# Patient Record
Sex: Male | Born: 1935 | Race: White | Hispanic: No | Marital: Married | Smoking: Smoker, current status unknown
Health system: Southern US, Community
[De-identification: ages and names within clinical notes are randomized; demographics above are authoritative.]

## PROBLEM LIST (undated history)

## (undated) DIAGNOSIS — I10 Essential (primary) hypertension: Secondary | ICD-10-CM

## (undated) DIAGNOSIS — Z950 Presence of cardiac pacemaker: Secondary | ICD-10-CM

## (undated) DIAGNOSIS — H3530 Unspecified macular degeneration: Secondary | ICD-10-CM

## (undated) DIAGNOSIS — N182 Chronic kidney disease, stage 2 (mild): Secondary | ICD-10-CM

## (undated) DIAGNOSIS — H269 Unspecified cataract: Secondary | ICD-10-CM

## (undated) DIAGNOSIS — I82629 Acute embolism and thrombosis of deep veins of unspecified upper extremity: Secondary | ICD-10-CM

## (undated) DIAGNOSIS — J449 Chronic obstructive pulmonary disease, unspecified: Secondary | ICD-10-CM

## (undated) DIAGNOSIS — E785 Hyperlipidemia, unspecified: Secondary | ICD-10-CM

## (undated) DIAGNOSIS — C801 Malignant (primary) neoplasm, unspecified: Secondary | ICD-10-CM

## (undated) DIAGNOSIS — I442 Atrioventricular block, complete: Secondary | ICD-10-CM

## (undated) HISTORY — DX: Chronic kidney disease, stage 2 (mild): N18.2

## (undated) HISTORY — DX: Acute embolism and thrombosis of deep veins of unspecified upper extremity: I82.629

## (undated) HISTORY — DX: Presence of cardiac pacemaker: Z95.0

## (undated) HISTORY — DX: Chronic obstructive pulmonary disease, unspecified: J44.9

## (undated) HISTORY — DX: Hyperlipidemia, unspecified: E78.5

## (undated) HISTORY — DX: Malignant (primary) neoplasm, unspecified: C80.1

## (undated) HISTORY — PX: SQUAMOUS CELL CARCINOMA EXCISION: SHX2433

## (undated) HISTORY — DX: Atrioventricular block, complete: I44.2

## (undated) HISTORY — DX: Essential (primary) hypertension: I10

## (undated) HISTORY — PX: OTHER SURGICAL HISTORY: SHX169

---

## 2008-10-26 ENCOUNTER — Encounter: Admission: RE | Admit: 2008-10-26 | Discharge: 2009-01-24 | Payer: Self-pay | Admitting: *Deleted

## 2010-06-05 DIAGNOSIS — I442 Atrioventricular block, complete: Secondary | ICD-10-CM

## 2010-06-05 HISTORY — DX: Atrioventricular block, complete: I44.2

## 2010-06-30 ENCOUNTER — Inpatient Hospital Stay (HOSPITAL_COMMUNITY): Admission: EM | Admit: 2010-06-30 | Discharge: 2010-07-04 | Payer: Self-pay | Admitting: Emergency Medicine

## 2010-06-30 ENCOUNTER — Ambulatory Visit: Payer: Self-pay | Admitting: Cardiology

## 2010-07-01 ENCOUNTER — Encounter: Payer: Self-pay | Admitting: Cardiology

## 2010-07-02 HISTORY — PX: PACEMAKER PLACEMENT: SHX43

## 2010-07-05 ENCOUNTER — Telehealth: Payer: Self-pay | Admitting: Cardiology

## 2010-07-06 ENCOUNTER — Ambulatory Visit: Payer: Self-pay | Admitting: Cardiovascular Disease

## 2010-07-06 ENCOUNTER — Ambulatory Visit: Payer: Self-pay

## 2010-07-06 DIAGNOSIS — R609 Edema, unspecified: Secondary | ICD-10-CM | POA: Insufficient documentation

## 2010-07-09 ENCOUNTER — Telehealth (INDEPENDENT_AMBULATORY_CARE_PROVIDER_SITE_OTHER): Payer: Self-pay | Admitting: *Deleted

## 2010-07-09 ENCOUNTER — Ambulatory Visit (HOSPITAL_COMMUNITY)
Admission: RE | Admit: 2010-07-09 | Discharge: 2010-07-09 | Payer: Self-pay | Source: Home / Self Care | Admitting: Cardiovascular Disease

## 2010-07-11 ENCOUNTER — Ambulatory Visit: Payer: Self-pay

## 2010-07-11 ENCOUNTER — Ambulatory Visit: Payer: Self-pay | Admitting: Internal Medicine

## 2010-07-11 LAB — CONVERTED CEMR LAB: POC INR: 2.2

## 2010-07-18 ENCOUNTER — Ambulatory Visit: Payer: Self-pay | Admitting: Cardiology

## 2010-07-18 LAB — CONVERTED CEMR LAB: POC INR: 4.1

## 2010-07-25 ENCOUNTER — Ambulatory Visit: Payer: Self-pay | Admitting: Cardiology

## 2010-07-25 LAB — CONVERTED CEMR LAB
BUN: 23 mg/dL (ref 6–23)
CO2: 27 meq/L (ref 19–32)
Calcium: 9.2 mg/dL (ref 8.4–10.5)
Chloride: 95 meq/L — ABNORMAL LOW (ref 96–112)
Creatinine, Ser: 1.4 mg/dL (ref 0.4–1.5)
GFR calc non Af Amer: 53.45 mL/min — ABNORMAL LOW (ref >60.00)
Glucose, Bld: 48 mg/dL — CL (ref 70–99)
POC INR: 3.4
Potassium: 4.6 meq/L (ref 3.5–5.1)
Sodium: 132 meq/L — ABNORMAL LOW (ref 135–145)

## 2010-08-01 ENCOUNTER — Ambulatory Visit: Payer: Self-pay | Admitting: Cardiology

## 2010-08-01 LAB — CONVERTED CEMR LAB: POC INR: 2.1

## 2010-08-10 DIAGNOSIS — J449 Chronic obstructive pulmonary disease, unspecified: Secondary | ICD-10-CM | POA: Insufficient documentation

## 2010-08-10 DIAGNOSIS — E785 Hyperlipidemia, unspecified: Secondary | ICD-10-CM | POA: Insufficient documentation

## 2010-08-10 DIAGNOSIS — E871 Hypo-osmolality and hyponatremia: Secondary | ICD-10-CM | POA: Insufficient documentation

## 2010-08-10 DIAGNOSIS — I10 Essential (primary) hypertension: Secondary | ICD-10-CM | POA: Insufficient documentation

## 2010-08-10 DIAGNOSIS — E119 Type 2 diabetes mellitus without complications: Secondary | ICD-10-CM | POA: Insufficient documentation

## 2010-08-10 DIAGNOSIS — N259 Disorder resulting from impaired renal tubular function, unspecified: Secondary | ICD-10-CM | POA: Insufficient documentation

## 2010-08-10 DIAGNOSIS — Z8669 Personal history of other diseases of the nervous system and sense organs: Secondary | ICD-10-CM | POA: Insufficient documentation

## 2010-08-10 DIAGNOSIS — J4489 Other specified chronic obstructive pulmonary disease: Secondary | ICD-10-CM | POA: Insufficient documentation

## 2010-08-13 ENCOUNTER — Ambulatory Visit
Admission: RE | Admit: 2010-08-13 | Discharge: 2010-08-13 | Payer: Self-pay | Source: Home / Self Care | Attending: Cardiology | Admitting: Cardiology

## 2010-08-13 ENCOUNTER — Encounter: Payer: Self-pay | Admitting: Cardiology

## 2010-08-13 ENCOUNTER — Ambulatory Visit: Admission: RE | Admit: 2010-08-13 | Discharge: 2010-08-13 | Payer: Self-pay | Source: Home / Self Care

## 2010-08-13 DIAGNOSIS — Z950 Presence of cardiac pacemaker: Secondary | ICD-10-CM | POA: Insufficient documentation

## 2010-08-13 DIAGNOSIS — F17200 Nicotine dependence, unspecified, uncomplicated: Secondary | ICD-10-CM | POA: Insufficient documentation

## 2010-08-13 LAB — CONVERTED CEMR LAB: POC INR: 1.9

## 2010-09-03 ENCOUNTER — Ambulatory Visit: Admission: RE | Admit: 2010-09-03 | Discharge: 2010-09-03 | Payer: Self-pay | Source: Home / Self Care

## 2010-09-03 ENCOUNTER — Telehealth (INDEPENDENT_AMBULATORY_CARE_PROVIDER_SITE_OTHER): Payer: Self-pay | Admitting: *Deleted

## 2010-09-03 LAB — CONVERTED CEMR LAB: POC INR: 1.8

## 2010-09-04 NOTE — Progress Notes (Signed)
Summary: pt having ankle swelling, some wrist since surgery  Phone Note Call from Patient   Caller: Patient  [phone] Summary of Call: pt having swelling in ankles and some in r wrist since surgery Initial call taken by: Glynda Jaeger,  July 05, 2010 1:33 PM  Follow-up for Phone Call        Pt. Called to report that he has non pitting edema in LE . He is not able to see his  ankles. Pt. said he had some edema while he was in the hospital for pacer implant. He did not have it when he was d/c yesterday. He notice the edema about two hrs. ago. Pt. states he put salt on his eggs this morning at breakfast time. According to pt. he is  taken the same medications for years. Follow-up by: Ollen Gross, RN, BSN,  July 05, 2010 2:08 PM  Additional Follow-up for Phone Call Additional follow up Details #1::        spoke with pt, he denies SOB. his right hand is swollen and both legs and feet.  he states he has had swelling in the right hand before when he had a blood clot but this is not the same. he was instructed to elevate his legs and to avoid salt. will foward for dr Jens Som review Deliah Goody, RN  July 05, 2010 3:18 PM\par   New Problems: SWELLING OF LIMB [phone])   Additional Follow-up for Phone Call Additional follow up Details #2::    f/u as scheduled; call if worsens Ferman Hamming, MD, [facility]  July 05, 2010 7:01 PM  pt says now his right arm is swollen from shoulder to hand, wants to "drop by" after lunch today and have a dr look at it between pt's -pls call to advise [phone] Glynda Jaeger  July 06, 2010 8:13 AM   Additional Follow-up for Phone Call Additional follow up Details #3:: Details for Additional Follow-up Action Taken: pt states last night his right arm began swelling from shoulder to his right hand--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

## 2010-09-04 NOTE — Medication Information (Signed)
Summary: new coumadin started 4 mg 12*-2-11/sl  Anticoagulant Therapy  Managed by: Leota Sauers, PharmD, BCPS, CPP Referring MD: Tonny Bollman MD. Supervising MD: Ladona Ridgel MD, Sharlot Gowda Indication 1: DVT INR POC 2.2 INR RANGE 2-3  Dietary changes: no    Health status changes: no    Bleeding/hemorrhagic complications: no    Recent/future hospitalizations: no    Any changes in medication regimen? no    Recent/future dental: no  Any missed doses?: no       Is patient compliant with meds? yes      Comments: Possible RUE clot after placement of pacer  - too much upper extremity edema to visualize clot on [place] - per pt - started on Coumadin 4mg  1 tab once daily since 12/2  Allergies (verified): No Known Drug Allergies  Anticoagulation Management History:      The patient comes in today for his initial visit for anticoagulation therapy.  Positive risk factors for bleeding include an age of 75 years or older.  The bleeding index is 'intermediate risk'.  Negative CHADS2 values include Age > 17 years old.  Anticoagulation responsible provider: Ladona Ridgel MD, Sharlot Gowda.  INR POC: 2.2.  Cuvette Lot#: [id number].    Anticoagulation Management Assessment/Plan:      The patient's current anticoagulation dose is Warfarin sodium 4 mg tabs: take one tablet by mouth every evening.  The next INR is due 07/18/2010.  Results were reviewed/authorized by Leota Sauers, PharmD, BCPS, CPP.         Current Anticoagulation Instructions: INR 2.2  Coumadin 4mg  tabs - 1 tab each day

## 2010-09-04 NOTE — Progress Notes (Signed)
  Phone Note Call from Patient   Caller: Patient Summary of Call: allergy   Follow-up for Phone Call        Pt has called to confirm that he does not have any dye allergies per his medical records from [place].  We discussed the venogram procedure as well as his previous hospital admission upon which he received a pacemaker.  He reports a "40% decrease in swelling since last Friday 07/06/10" Especially in his right hand.  He is not having any discomfort at this time and is aware of arrival time to Short Stay this am. Mylo Red RN

## 2010-09-04 NOTE — Assessment & Plan Note (Signed)
Summary: rov   Visit Type:  Follow-up  CC:  swelling in right arm and both feet.  History of Present Illness: 77 year-old male presents for hospital followup after recent hospitalization for complete heart block. The patient required emergency transvenous pacing followed by a permanent pacemaker placed on the right side 07/02/2010. The right side was chosen because he has had squamous cell CA and prior XRT/surgery with limited use of his right arm and hand. He called into the office this am with right arm swelling, new since hospital discharge. A venous duplex was done and did not show DVT. He also complains of bilateral leg swelling. He has stable dyspnea, no chest pain, and no orthopnea or PND.   Current Medications (verified): 1)  Tylenol Extra Strength 500 Mg Tabs (Acetaminophen) .... Take One By Mouth Once Daily 2)  Acidophilus  Caps (Lactobacillus) .... Take 1-3 Capsules Three Times A Day With Meals 3)  Actos 15 Mg Tabs (Pioglitazone Hcl) .... Take One Daily 4)  Aspirin Ec 325 Mg Tbec (Aspirin) .... Take Two Tablets By Mouth Daily 5)  Coq10 .... One Every Morning 6)  Glucosamine-Chondroitin 250-200 Mg Caps (Glucosamine-Chondroitin) .... Take Two Every Morning 7)  Mucinex Dm .... One By Mouth Two Times A Day 8)  Niacin 100 Mg Tabs (Niacin) .... Take Two Times A Day 9)  Prandin 2 Mg Tabs (Repaglinide) .... Take Three Times A Day Before Meals 10)  Simvastatin 40 Mg Tabs (Simvastatin) .... Take One Tablet By Mouth Daily At Bedtime 11)  Tarka 4-240 Mg Cr-Tabs (Trandolapril-Verapamil Hcl) .... Once Daily At Bedtime 12)  Tricor 145 Mg Tabs (Fenofibrate) .... Take One Daily At Bedtime 13)  Vitamin B Complex .... Take One Tablet By Mouth Daily  Allergies (verified): No Known Drug Allergies  Past History:  Past medical history reviewed for relevance to current acute and chronic problems.  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 HEENT: normal Neck: normal carotid upstrokes without bruits, JVP normal Lungs: CTA CV: RRR without murmur or gallop Abd: soft, NT, positive BS, no bruit, no organomegaly Ext: 2+ bilateral pedal edema. peripheral pulses 2+, left arm normal, right arm 2-3+ edema to the hand Skin: warm and dry without rash    Venous Doppler  Procedure date:  07/06/2010  Findings:      Right arm venous duplex: negative for DVT  Impression & Recommendations:  Problem # 1:  SWELLING OF LIMB (ICD-729.81)  Pt with asymmetric edema of the ipsilateral arm following permanent pacemaker insertion via a right subclavian approach. There is a high clinical probability of arm DVT (device related), despite the negative duplex. Reviewed the patient's case with Dr Graciela Husbands, and recommend start warfarin 4 mg daily. He will come for a venogram Monday, Dec 5th to eval for R arm DVT. He was advised that if he can  no longer spin his wedding ring on his finger, or if he develps skin color change or pain in the 4th finger, he needs to immediately have his ring cut off. He had a similar problem at the time of radiation therapy and was able to keep the ring on at that time. Arm elevation was recommended as well.   Orders: Cardiac Catheterization (Cardiac Cath) [facility] Coumadin Clinic Referral (Coumadin clinic)  Problem # 2:  EDEMA (ICD-782.3)  The patient also has bilateral leg edema without  obvious signs of CHF. He had renal dysfunction on presentation but at discharge his creatinine was 1.2 mg/dL. Will start furosemide 40 mg daily and KCl 20 meq daily. Followup BMET and BNP Monday am.  Echo last week showed normal LV function.  Orders: Cardiac Catheterization (Cardiac Cath) [facility]. Coumadin Clinic Referral (Coumadin clinic)  Patient Instructions: 1)  You have been scheduled for a Venogram on Monday 07/09/10. 2)  Your physician has recommended you make the following change in your medication: START Warfarin 4mg  take one tablet by mouth every evening, START Furosemide 40mg  take one tablet by mouth daily, START Potassium Chloride take one tablet by mouth daily, DECREASE Aspirin 81mg  daily 3)  You have been referred to the COUMADIN CLINIC. Prescriptions: WARFARIN SODIUM 4 MG TABS (WARFARIN SODIUM) take one tablet by mouth every evening  #30 x 0   Entered by:   Julieta Gutting, RN, BSN   Authorized by:   Norva Karvonen, MD   Signed by:   Julieta Gutting, RN, BSN on 07/06/2010   Method used:   Print then Give to Patient   RxID:   [id number] FUROSEMIDE 40 MG TABS (FUROSEMIDE) Take one tablet by mouth daily.  #30 x 2   Entered by:   Julieta Gutting, RN, BSN   Authorized by:   Norva Karvonen, MD   Signed by:   Julieta Gutting, RN, BSN on 07/06/2010   Method used:   Print then Give to Patient   RxID:   [id number] POTASSIUM CHLORIDE CRYS CR 20 MEQ CR-TABS (POTASSIUM CHLORIDE CRYS CR) Take one tablet by mouth daily  #30 x 2   Entered by:   Julieta Gutting, RN, BSN   Authorized by:   Norva Karvonen, MD   Signed by:   Julieta Gutting, RN, BSN on 07/06/2010   Method used:   Print then Give to Patient   RxID:   [id number]

## 2010-09-04 NOTE — Procedures (Signed)
Summary: wound check.Gerald Hardin   Current Medications (verified): 1)  Tylenol Extra Strength 500 Mg Tabs (Acetaminophen) .... Take One By Mouth Once Daily 2)  Acidophilus  Caps (Lactobacillus) .... Take 1-3 Capsules Three Times A Day With Meals 3)  Actos 15 Mg Tabs (Pioglitazone Hcl) .... Take One Daily 4)  Aspirin 81 Mg Tbec (Aspirin) .... Take One Tablet By Mouth Daily 5)  Coq10 .... One Every Morning 6)  Glucosamine-Chondroitin 250-200 Mg Caps (Glucosamine-Chondroitin) .... Take Two Every Morning 7)  Mucinex Dm .... One By Mouth Two Times A Day 8)  Niacin 100 Mg Tabs (Niacin) .... Take Two Times A Day 9)  Prandin 2 Mg Tabs (Repaglinide) .... Take Three Times A Day Before Meals 10)  Simvastatin 40 Mg Tabs (Simvastatin) .... Take One Tablet By Mouth Daily At Bedtime 11)  Tarka 4-240 Mg Cr-Tabs (Trandolapril-Verapamil Hcl) .... Once Daily At Bedtime 12)  Tricor 145 Mg Tabs (Fenofibrate) .... Take One Daily At Bedtime 13)  Vitamin B Complex .... Take One Tablet By Mouth Daily 14)  Warfarin Sodium 4 Mg Tabs (Warfarin Sodium) .... Take One Tablet By Mouth Every Evening 15)  Furosemide 40 Mg Tabs (Furosemide) .... Take One Tablet By Mouth Daily. 16)  Potassium Chloride Crys Cr 20 Meq Cr-Tabs (Potassium Chloride Crys Cr) .... Take One Tablet By Mouth Daily  Allergies (verified): No Known Drug Allergies  PPM Specifications Following MD:  Sherryl Manges, MD     PPM Vendor:  [employer]     PPM Model Number:  2110     PPM Serial Number:  [id number] PPM DOI:  07/02/2010     PPM Implanting MD:  Sherryl Manges, MD  Lead 1    Location: RA     DOI: 07/02/2010     Model #: 2088     Serial #: [id number]     Status: active Lead 2    Location: RV     DOI: 07/02/2010     Model #: 2088     Serial #: [id number]     Status: active  PPM Follow Up Battery Voltage:  3.10 V     Battery Est. Longevity:  5.9-7.1 YRS       PPM Device Measurements [employer]  Amplitude: 3.7 mV, Impedance: 400 ohms, Threshold: 0.75 V at 0.4  msec Right Ventricle  Amplitude: 12.0 mV, Impedance: 550 ohms, Threshold: 0.75 V at 0.4 msec  Episodes MS Episodes:  244     Percent Mode Switch:  6.2%     Coumadin:  Yes Ventricular High Rate:  0     Atrial Pacing:  2.3%     Ventricular Pacing:  >99%  Parameters Mode:  DDD     Lower Rate Limit:  60     Upper Rate Limit:  130 Paced AV Delay:  180     Sensed AV Delay:  160 Next Cardiology Appt Due:  10/30/2010 Tech Comments:  WOUND CHECK--STERI STRIPS REMOVED.  NO REDNESS OR SWELLING AT SITE.  PT HAS RIGHT ARM SWELLING THAT IS BEING TAKING CARE OF.  PT SCHEDULED FOR VENOGRAM AT SOME POINT.  244 AMS EPISODES--LONGEST WAS 20 MINUTES.  NORMAL DEVICE FUNCTION.  NO CHANGES MADE. ROV 10-30-10 @ 920  W/SK. Gerald Hardin  July 12, 2010 8:24 AM

## 2010-09-04 NOTE — Letter (Signed)
Summary: Cardiac Catheterization Instructions- Main Lab  [facility], Main Office  [address]   [place], [place] [address]   Phone: [phone]  Fax: [phone]     07/06/2010 MRN: [id number]  Emmanuelle Caratachea [address] [place], [place]  [address]  Dear Mr. Bushnell,   You are scheduled for Venogram on Monday July 09, 2010 with Dr. Excell Seltzer.  Please arrive at the [facility] at 11:45       a.m. on the day of your procedure.  1. DIET     _X___ Nothing to eat after midnight. You may have clear liquids until 8:00 AM then nothingto eat or drink after midnight. You can take your medications with a sip of water.  2. MAKE SURE YOU TAKE YOUR ASPIRIN.  3. _X____ DO NOT TAKE these medications before your procedure:      Do not take Actos the morning of procedure.          __X__ YOU MAY TAKE ALL of your remaining medications with a small amount of water.      4. Plan for one night stay - bring personal belongings (i.e. toothpaste, toothbrush, etc.)  5. Bring a current list of your medications and current insurance cards.  6. Must have a responsible person to drive you home.   7. Someone must be with you for the first 24 hours after you arrive home.  8. Please wear clothes that are easy to get on and off and wear slip-on shoes.  *Special note: Every effort is made to have your procedure done on time.  Occasionally there are emergencies that present themselves at the hospital that may cause delays.  Please be patient if a delay does occur.  If you have any questions after you get home, please call the office at the number listed above.  Julieta Gutting, RN, BSN

## 2010-09-06 NOTE — Medication Information (Signed)
Summary: rov/sp  Anticoagulant Therapy  Managed by: Weston Brass, PharmD Referring MD: Tonny Bollman MD. Supervising MD: Shirlee Latch MD, Freida Busman Indication 1: DVT INR POC 3.4 INR RANGE 2-3  Dietary changes: no    Health status changes: yes       Details: Pt complains of losing sense of taste since starting K.  He had a BMET 2 days after starting supplement that was 4.5.  Will recheck BMET today to make sure K is okay.  Pt's swelling has improved since starting Lasix.    Bleeding/hemorrhagic complications: no    Recent/future hospitalizations: no    Any changes in medication regimen? no    Recent/future dental: no  Any missed doses?: no       Is patient compliant with meds? yes      Comments: Pt will be traveling to [facility] in the next few weeks.  Has Rx to get INR checked in [place] next week.   Current Medications (verified): 1)  Tylenol Extra Strength 500 Mg Tabs (Acetaminophen) .... Take One By Mouth Once Daily 2)  Acidophilus  Caps (Lactobacillus) .... Take 1-3 Capsules Three Times A Day With Meals 3)  Actos 15 Mg Tabs (Pioglitazone Hcl) .... Take One Daily 4)  Aspirin 81 Mg Tbec (Aspirin) .... Take One Tablet By Mouth Daily 5)  Coq10 .... One Every Morning 6)  Glucosamine-Chondroitin 250-200 Mg Caps (Glucosamine-Chondroitin) .... Take Two Every Morning 7)  Mucinex Dm .... One By Mouth Two Times A Day 8)  Prandin 2 Mg Tabs (Repaglinide) .... Take Three Times A Day Before Meals 9)  Simvastatin 40 Mg Tabs (Simvastatin) .... Take One Tablet By Mouth Daily At Bedtime 10)  Tarka 4-240 Mg Cr-Tabs (Trandolapril-Verapamil Hcl) .... Once Daily At Bedtime 11)  Tricor 145 Mg Tabs (Fenofibrate) .... Take One Daily At Bedtime 12)  Vitamin B Complex .... Take One Tablet By Mouth Daily 13)  Warfarin Sodium 4 Mg Tabs (Warfarin Sodium) .... Take One Tablet By Mouth Every Evening 14)  Furosemide 40 Mg Tabs (Furosemide) .... Take One Tablet By Mouth Daily. 15)  Potassium Chloride Crys Cr 20 Meq Cr-Tabs  (Potassium Chloride Crys Cr) .... Take One Tablet By Mouth Daily 16)  Nicotinic Acid 100 Mg Cr-Caps (Niacin) .... Take 1 Tablet By Mouth Two Times A Day  Allergies: No Known Drug Allergies  Anticoagulation Management History:      The patient is taking warfarin and comes in today for a routine follow up visit.  Positive risk factors for bleeding include an age of 75 years or older.  The bleeding index is 'intermediate risk'.  Negative CHADS2 values include Age > 75 years old.  Anticoagulation responsible provider: Shirlee Latch MD, [place].  INR POC: 3.4.  Cuvette Lot#: [id number].  Exp: 09/2011.    Anticoagulation Management Assessment/Plan:      The patient's current anticoagulation dose is Warfarin sodium 4 mg tabs: take one tablet by mouth every evening.  The target INR is 2.0-3.0.  The next INR is due 08/01/2010.  Anticoagulation instructions were given to patient/wife.  Results were reviewed/authorized by Weston Brass, PharmD.  He was notified by Weston Brass PharmD.        Coagulation management information includes: [phone] (wife's cell phone)- most consistent number to call in [place] or [place].  [place] number- [phone] [place] residence- pt's mother in law).  Prior Anticoagulation Instructions: INR 4.1  Skip today's dose of Coumadin then decrease dose to 1 tablet every day except 1/2 tablet on Tuesday, Thursday and  Saturday.  Start taking Coumadin in the morning.  Recheck INR in 1 week.   Current Anticoagulation Instructions: INR 3.4  Skip tomorrow's dose of Coumadin then decrease dose to 1/2 tablet every day except 1 tablet on Monday, Wednesday and Friday.  Recheck INR in 1 week in [place].

## 2010-09-06 NOTE — Medication Information (Signed)
Summary: rov/sp  Anticoagulant Therapy  Managed by: Weston Brass, PharmD Referring MD: Tonny Bollman MD. Supervising MD: Riley Kill MD, Maisie Fus Indication 1: DVT Lab Used: [facility] Site: [address] INR POC 2.1 INR RANGE 2-3  Dietary changes: no    Health status changes: no    Bleeding/hemorrhagic complications: no    Recent/future hospitalizations: no    Any changes in medication regimen? no    Recent/future dental: no  Any missed doses?: no       Is patient compliant with meds? yes       Allergies: No Known Drug Allergies  Anticoagulation Management History:      The patient is taking warfarin and comes in today for a routine follow up visit.  Positive risk factors for bleeding include an age of 77 years or older.  The bleeding index is 'intermediate risk'.  Negative CHADS2 values include Age > 78 years old.  Anticoagulation responsible provider: Riley Kill MD, Maisie Fus.  INR POC: 2.1.  Cuvette Lot#: [id number].  Exp: 08/2011.    Anticoagulation Management Assessment/Plan:      The patient's current anticoagulation dose is Warfarin sodium 4 mg tabs: take one tablet by mouth every evening.  The target INR is 2.0-3.0.  The next INR is due 08/13/2010.  Anticoagulation instructions were given to patient/wife.  Results were reviewed/authorized by Weston Brass, PharmD.  He was notified by Weston Brass PharmD.         Prior Anticoagulation Instructions: INR 3.4  Skip tomorrow's dose of Coumadin then decrease dose to 1/2 tablet every day except 1 tablet on Monday, Wednesday and Friday.  Recheck INR in 1 week in [place].   Current Anticoagulation Instructions: INR 2.1  Continue same dose of 1/2 tablet every day except 1 tablet on Monday, Wednesday and Friday.  Recheck INR in 2 weeks.

## 2010-09-06 NOTE — Medication Information (Signed)
Summary: rov coumadin - [facility]  Anticoagulant Therapy  Managed by: Weston Brass, PharmD Referring MD: Tonny Bollman MD. Supervising MD: Shirlee Latch MD, Freida Busman Indication 1: DVT INR POC 4.1 INR RANGE 2-3  Dietary changes: yes       Details: decreased appetite  Health status changes: yes       Details: some swelling in feet (1+ pitting edema)  pt states has improved  Bleeding/hemorrhagic complications: no    Recent/future hospitalizations: no    Any changes in medication regimen? no    Recent/future dental: no  Any missed doses?: no       Is patient compliant with meds? yes       Allergies: No Known Drug Allergies  Anticoagulation Management History:      The patient is taking warfarin and comes in today for a routine follow up visit.  Positive risk factors for bleeding include an age of 32 years or older.  The bleeding index is 'intermediate risk'.  Negative CHADS2 values include Age > 17 years old.  Anticoagulation responsible provider: Shirlee Latch MD, [place].  INR POC: 4.1.    Anticoagulation Management Assessment/Plan:      The patient's current anticoagulation dose is Warfarin sodium 4 mg tabs: take one tablet by mouth every evening.  The target INR is 2.0-3.0.  The next INR is due 07/25/2010.  Results were reviewed/authorized by Weston Brass, PharmD.  He was notified by Weston Brass PharmD.         Prior Anticoagulation Instructions: INR 2.2  Coumadin 4mg  tabs - 1 tab each day  Current Anticoagulation Instructions: INR 4.1  Skip today's dose of Coumadin then decrease dose to 1 tablet every day except 1/2 tablet on Tuesday, Thursday and Saturday.  Start taking Coumadin in the morning.  Recheck INR in 1 week.

## 2010-09-06 NOTE — Assessment & Plan Note (Signed)
Summary: Gerald Hardin   History of Present Illness: 77 year old male admitted in November of 2011 with complete heart block. He required emergency temporary transvenous pacer. Echocardiogram showed an ejection fraction of 65-70% with a mildly elevated LVOT gradient. He ultimately had a permanent pacemaker placed. He was seen in followup by Dr. Excell Seltzer and noted to have swelling in his right upper extremity where the pacemaker was placed. A duplex apparently showed no DVT but there was high suspicion of a thrombus clinically and he was placed on Coumadin. He was scheduled for a venogram but due to the swelling they were unable to access his vein. Since then the patient has dyspnea with more extreme activities but not with routine activities. It is relieved with rest. It is not associated with chest pain. There is no orthopnea, PND. There is no syncope or palpitations. There is no exertional chest pain. He has had lower extremity edema and is taking Lasix now.   Current Medications (verified): 1)  Tylenol Extra Strength 500 Mg Tabs (Acetaminophen) .... Take One By Mouth Once Daily 2)  Acidophilus  Caps (Lactobacillus) .... Take 1-3 Capsules Three Times A Day With Meals 3)  Actos 15 Mg Tabs (Pioglitazone Hcl) .... Take One Daily 4)  Aspirin 81 Mg Tbec (Aspirin) .... Take One Tablet By Mouth Daily 5)  Coq10 .... One Every Morning 6)  Glucosamine-Chondroitin 250-200 Mg Caps (Glucosamine-Chondroitin) .... Take Two Every Morning 7)  Mucinex Dm .... One By Mouth Two Times A Day 8)  Prandin 2 Mg Tabs (Repaglinide) .... Take Three Times A Day Before Meals 9)  Simvastatin 40 Mg Tabs (Simvastatin) .... Take One Tablet By Mouth Daily At Bedtime 10)  Tarka 4-240 Mg Cr-Tabs (Trandolapril-Verapamil Hcl) .... Once Daily At Bedtime 11)  Tricor 145 Mg Tabs (Fenofibrate) .... Take One Daily At Bedtime 12)  Vitamin B Complex .... Take One Tablet By Mouth Daily 13)  Warfarin Sodium 4 Mg Tabs (Warfarin Sodium) ....  Take One Tablet By Mouth Every Evening 14)  Furosemide 20 Mg Tabs (Furosemide) .... Take One Tablet By Mouth Daily. 15)  Nicotinic Acid 100 Mg Cr-Caps (Niacin) .... 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 Smoker No ETOH Lives in [place] permanently but in [place] for an extended time  Review of Systems       no fevers or chills, productive cough, hemoptysis, dysphasia, odynophagia, melena, hematochezia, dysuria, hematuria, rash, seizure activity, orthopnea, PND, pedal edema, claudication. Remaining systems are negative.   Vital Signs:  Patient profile:   77 year old male Height:      71 inches Weight:      194 pounds BMI:     27.16 Pulse rate:   68 / minute Pulse rhythm:   regular Resp:     18 per minute BP sitting:   110 / 60  (left arm)  Vitals Entered By: Kem Parkinson (August 13, 2010 8:40 AM)  Physical Exam  General:  Well-developed well-nourished in no acute distress.  Skin is warm and dry.  HEENT is normal.  Neck is supple. No thyromegaly.  Chest is clear to auscultation with normal expansion.  Cardiovascular exam is regular rate and rhythm.  Abdominal  exam nontender or distended. No masses palpated. Extremities show 2+ ankle edema. Right upper extremity shows 1+ edema surrounding the elbow. It has improved per patient report. He has decreased muscular strength in his right upper extremity from previous radiation and tumor resection. neuro grossly intact    EKG  Procedure date:  08/13/2010  Findings:      Sinus with ventricular pacing.  PPM Specifications Following MD:  Sherryl Manges, MD     PPM Vendor:  [employer]     PPM Model Number:  2110      PPM Serial Number:  [id number] PPM DOI:  07/02/2010     PPM Implanting MD:  Sherryl Manges, MD  Lead 1    Location: [facility]     DOI: 07/02/2010     Model #: 2088     Serial #: [id number]     Status: active Lead 2    Location: [facility]     DOI: 07/02/2010     Model #: 2088     Serial #: [id number]     Status: active  Episodes Coumadin:  Yes  Parameters Mode:  DDD     Lower Rate Limit:  60     Upper Rate Limit:  130 Paced AV Delay:  180     Sensed AV Delay:  160  Impression & Recommendations:  Problem # 1:  HYPERTENSION (ICD-401.9) Blood pressure controlled on present medications. Will continue. His updated medication list for this problem includes:    Aspirin 81 Mg Tbec (Aspirin) .Marland Kitchen... Take one tablet by mouth daily    Tarka 4-240 Mg Cr-tabs (Trandolapril-verapamil hcl) ..... Once daily at bedtime    Furosemide 20 Mg Tabs (Furosemide) .Marland Kitchen... Take one tablet by mouth daily.  Problem # 2:  HYPERLIPIDEMIA (ICD-272.4) Continue present medications. His updated medication list for this problem includes:    Simvastatin 40 Mg Tabs (Simvastatin) .Marland Kitchen... Take one tablet by mouth daily at bedtime    Tricor 145 Mg Tabs (Fenofibrate) .Marland Kitchen... Take one daily at bedtime  Problem # 3:  COPD (ICD-496)  Problem # 4:  DM (ICD-250.00)  His updated medication list for this problem includes:    Actos 15 Mg Tabs (Pioglitazone hcl) .Marland Kitchen... Take one daily    Aspirin 81 Mg Tbec (Aspirin) .Marland Kitchen... Take one tablet by mouth daily    Prandin 2 Mg Tabs (Repaglinide) .Marland Kitchen... Take three times a day before meals    Tarka 4-240 Mg Cr-tabs (Trandolapril-verapamil hcl) ..... Once daily at bedtime  Problem # 5:  EDEMA (ICD-782.3) Patient with intermittent lower extremity edema. He will continue Lasix 20 mg p.o. daily with an additional 20 mg added p.r.n. daily increased edema. If he takes 40 mg he will take 20 mEq of potassium.  Problem # 6:  SWELLING OF LIMB (ICD-729.81) He had right upper extremity edema following his pacemaker and is being  treated empirically for DVT. We'll discuss timing of venogram with Dr. Excell Seltzer and Dr. Graciela Husbands.  Problem # 7:  PACEMAKER, PERMANENT (ICD-V45.01) Followup with EP.  Problem # 8:  TOBACCO ABUSE (ICD-305.1) Patient counseled on discontinuing.  Patient Instructions: 1)  Your physician recommends that you schedule a follow-up appointment ON MARCH 27 @ 9:20AM

## 2010-09-06 NOTE — Cardiovascular Report (Signed)
Summary: Office Visit   Office Visit   Imported By: Roderic Ovens 07/23/2010 16:19:40  _____________________________________________________________________  External Attachment:    Type:   Image     Comment:   External Document

## 2010-09-06 NOTE — Medication Information (Signed)
Summary: rov/sp  Anticoagulant Therapy  Managed by: Cloyde Reams, RN, BSN Referring MD: Tonny Bollman MD. Supervising MD: Jens Som MD, Arlys John Indication 1: DVT Lab Used: [facility] [place] Site: [address] INR POC 1.9 INR RANGE 2-3  Dietary changes: no    Health status changes: no    Bleeding/hemorrhagic complications: no    Recent/future hospitalizations: no    Any changes in medication regimen? no    Recent/future dental: no  Any missed doses?: no       Is patient compliant with meds? yes       Allergies: No Known Drug Allergies  Anticoagulation Management History:      The patient is taking warfarin and comes in today for a routine follow up visit.  Positive risk factors for bleeding include an age of 75 years or older and presence of serious comorbidities.  The bleeding index is 'intermediate risk'.  Positive CHADS2 values include History of HTN and History of Diabetes.  Negative CHADS2 values include Age > 60 years old.  Anticoagulation responsible provider: Jens Som MD, Arlys John.  INR POC: 1.9.  Cuvette Lot#: [id number].  Exp: 09/2011.    Anticoagulation Management Assessment/Plan:      The patient's current anticoagulation dose is Warfarin sodium 4 mg tabs: take one tablet by mouth every evening.  The target INR is 2.0-3.0.  The next INR is due 09/03/2010.  Anticoagulation instructions were given to patient/wife.  Results were reviewed/authorized by Cloyde Reams, RN, BSN.  He was notified by Cloyde Reams RN.         Prior Anticoagulation Instructions: INR 2.1  Continue same dose of 1/2 tablet every day except 1 tablet on Monday, Wednesday and Friday.  Recheck INR in 2 weeks.   Current Anticoagulation Instructions: INR 1.9  Take an extra 1/2 tablet today, then resume same dosage 1/2 tablet daily except 1 tablet on Mondays, Wednesdays, and Fridays.  Recheck in 3 weeks.

## 2010-09-12 NOTE — Medication Information (Signed)
Summary: rov/ewj  Anticoagulant Therapy  Managed by: Bethena Midget, RN, BSN Referring MD: Tonny Bollman MD. Supervising MD: Antoine Poche MD, Fayrene Fearing Indication 1: DVT Lab Used: [facility] [place] Site: [address] INR POC 1.8 INR RANGE 2-3  Dietary changes: no    Health status changes: no    Bleeding/hemorrhagic complications: no    Recent/future hospitalizations: no    Any changes in medication regimen? no    Recent/future dental: no  Any missed doses?: no       Is patient compliant with meds? yes       Allergies: No Known Drug Allergies  Anticoagulation Management History:      The patient is taking warfarin and comes in today for a routine follow up visit.  Positive risk factors for bleeding include an age of 75 years or older and presence of serious comorbidities.  The bleeding index is 'intermediate risk'.  Positive CHADS2 values include History of HTN and History of Diabetes.  Negative CHADS2 values include Age > 75 years old.  Anticoagulation responsible provider: Antoine Poche MD, Fayrene Fearing.  INR POC: 1.8.  Cuvette Lot#: [id number].  Exp: 08/2011.    Anticoagulation Management Assessment/Plan:      The patient's current anticoagulation dose is Warfarin sodium 4 mg tabs: take one tablet by mouth every evening.  The target INR is 2.0-3.0.  The next INR is due 09/17/2010.  Anticoagulation instructions were given to patient/wife.  Results were reviewed/authorized by Bethena Midget, RN, BSN.  He was notified by Bethena Midget, RN, BSN.         Prior Anticoagulation Instructions: INR 1.9  Take an extra 1/2 tablet today, then resume same dosage 1/2 tablet daily except 1 tablet on Mondays, Wednesdays, and Fridays.  Recheck in 3 weeks.    Current Anticoagulation Instructions: INR 1.8 Today take extra 1/2 pill then change dose to 1 pill everyday except 1/2 pill on Tuesdays, Thursdays and Sundays. Recheck in 2 weeks. Pt given Rx  for INR to be drawn if Fl.

## 2010-09-12 NOTE — Progress Notes (Signed)
  Pt Signed ROI, His Records were mailed to  Dr.Michael Cromer [id number] [facility] [place] [id number] [facility]  September 03, 2010 1:28 PM

## 2010-09-20 ENCOUNTER — Encounter: Payer: Self-pay | Admitting: Internal Medicine

## 2010-09-20 ENCOUNTER — Encounter (INDEPENDENT_AMBULATORY_CARE_PROVIDER_SITE_OTHER): Payer: Self-pay | Admitting: Pharmacist

## 2010-09-20 LAB — CONVERTED CEMR LAB
POC INR: 2.8
Prothrombin Time: 28.8 s

## 2010-09-21 ENCOUNTER — Encounter: Payer: Self-pay | Admitting: Internal Medicine

## 2010-09-26 NOTE — Medication Information (Signed)
Summary: [facility]  Anticoagulant Therapy  Managed by: Bethena Midget, RN, BSN Referring MD: Tonny Bollman MD. Supervising MD: Tenny Craw MD, Gunnar Fusi Indication 1: DVT Lab Used: [facility] [place] Site: [address] PT 28.8 INR POC 2.8 INR RANGE 2-3  Dietary changes: no    Health status changes: no    Bleeding/hemorrhagic complications: no    Recent/future hospitalizations: no    Any changes in medication regimen? no    Recent/future dental: no  Any missed doses?: no       Is patient compliant with meds? yes       Allergies: No Known Drug Allergies  Anticoagulation Management History:      His anticoagulation is being managed by telephone today.  Positive risk factors for bleeding include an age of 43 years or older and presence of serious comorbidities.  The bleeding index is 'intermediate risk'.  Positive CHADS2 values include History of HTN and History of Diabetes.  Negative CHADS2 values include Age > 59 years old.  Prothrombin time is 28.8.  Anticoagulation responsible provider: Tenny Craw MD, Gunnar Fusi.  INR POC: 2.8.    Anticoagulation Management Assessment/Plan:      The patient's current anticoagulation dose is Warfarin sodium 4 mg tabs: take one tablet by mouth every evening.  The target INR is 2.0-3.0.  The next INR is due 10/05/2010.  Anticoagulation instructions were given to patient/wife.  Results were reviewed/authorized by Bethena Midget, RN, BSN.  He was notified by Bethena Midget, RN, BSN.         Prior Anticoagulation Instructions: INR 1.8 Today take extra 1/2 pill then change dose to 1 pill everyday except 1/2 pill on Tuesdays, Thursdays and Sundays. Recheck in 2 weeks. Pt given Rx  for INR to be drawn if Fl.    Current Anticoagulation Instructions: INR 2.8 Attempted to call pt, no answer or VM. Bethena Midget, RN, BSN  September 21, 2010 9:38 AM   Spoke with pt spouse, she states pt has not had any changes or problems. Next appt s/c here at [facility].

## 2010-10-04 ENCOUNTER — Encounter: Payer: Self-pay | Admitting: Cardiovascular Disease

## 2010-10-04 DIAGNOSIS — I82409 Acute embolism and thrombosis of unspecified deep veins of unspecified lower extremity: Secondary | ICD-10-CM

## 2010-10-05 ENCOUNTER — Encounter (INDEPENDENT_AMBULATORY_CARE_PROVIDER_SITE_OTHER): Payer: Medicare Other

## 2010-10-05 ENCOUNTER — Encounter: Payer: Self-pay | Admitting: Internal Medicine

## 2010-10-05 DIAGNOSIS — Z7901 Long term (current) use of anticoagulants: Secondary | ICD-10-CM

## 2010-10-05 DIAGNOSIS — I80299 Phlebitis and thrombophlebitis of other deep vessels of unspecified lower extremity: Secondary | ICD-10-CM

## 2010-10-05 LAB — CONVERTED CEMR LAB: POC INR: 1.8

## 2010-10-11 NOTE — Medication Information (Signed)
Summary: rov/tm  Anticoagulant Therapy  Managed by: Cloyde Reams, RN, BSN Referring MD: Tonny Bollman MD. Supervising MD: Clifton Samari MD, Cristal Deer Indication 1: DVT Lab Used: [facility] [place] Site: [address] INR POC 1.8 INR RANGE 2-3  Dietary changes: no    Health status changes: no    Bleeding/hemorrhagic complications: no    Recent/future hospitalizations: no    Any changes in medication regimen? no    Recent/future dental: no  Any missed doses?: yes     Details: Missed 1/2 tablet on Tuesday.  Is patient compliant with meds? yes       Allergies: No Known Drug Allergies  Anticoagulation Management History:      The patient is taking warfarin and comes in today for a routine follow up visit.  Positive risk factors for bleeding include an age of 24 years or older and presence of serious comorbidities.  The bleeding index is 'intermediate risk'.  Positive CHADS2 values include History of HTN and History of Diabetes.  Negative CHADS2 values include Age > 90 years old.  Anticoagulation responsible provider: Clifton Kutter MD, Cristal Deer.  INR POC: 1.8.  Cuvette Lot#: [id number].  Exp: 08/2011.    Anticoagulation Management Assessment/Plan:      The patient's current anticoagulation dose is Warfarin sodium 4 mg tabs: take one tablet by mouth every evening.  The target INR is 2.0-3.0.  The next INR is due 10/18/2010.  Anticoagulation instructions were given to patient/wife.  Results were reviewed/authorized by Cloyde Reams, RN, BSN.  He was notified by Cloyde Reams RN.         Prior Anticoagulation Instructions: INR 2.8 Attempted to call pt, no answer or VM. Bethena Midget, RN, BSN  September 21, 2010 9:38 AM   Spoke with pt spouse, she states pt has not had any changes or problems. Next appt s/c here at [facility].   Current Anticoagulation Instructions: INR 1.8  Take an extra 1/2 tablet today, then resume same dosage 1 tablet daily except 1/2 tablet on Sundays,  Tuesdays, and Thursdays.  Recheck in 2 weeks.

## 2010-10-16 ENCOUNTER — Encounter: Payer: Self-pay | Admitting: Cardiology

## 2010-10-16 LAB — GLUCOSE, CAPILLARY
Glucose-Capillary: 105 mg/dL — ABNORMAL HIGH (ref 70–99)
Glucose-Capillary: 106 mg/dL — ABNORMAL HIGH (ref 70–99)
Glucose-Capillary: 111 mg/dL — ABNORMAL HIGH (ref 70–99)
Glucose-Capillary: 112 mg/dL — ABNORMAL HIGH (ref 70–99)
Glucose-Capillary: 124 mg/dL — ABNORMAL HIGH (ref 70–99)
Glucose-Capillary: 131 mg/dL — ABNORMAL HIGH (ref 70–99)
Glucose-Capillary: 132 mg/dL — ABNORMAL HIGH (ref 70–99)
Glucose-Capillary: 136 mg/dL — ABNORMAL HIGH (ref 70–99)
Glucose-Capillary: 139 mg/dL — ABNORMAL HIGH (ref 70–99)
Glucose-Capillary: 139 mg/dL — ABNORMAL HIGH (ref 70–99)
Glucose-Capillary: 150 mg/dL — ABNORMAL HIGH (ref 70–99)
Glucose-Capillary: 151 mg/dL — ABNORMAL HIGH (ref 70–99)
Glucose-Capillary: 156 mg/dL — ABNORMAL HIGH (ref 70–99)
Glucose-Capillary: 311 mg/dL — ABNORMAL HIGH (ref 70–99)
Glucose-Capillary: 34 mg/dL — CL (ref 70–99)
Glucose-Capillary: 41 mg/dL — CL (ref 70–99)
Glucose-Capillary: 82 mg/dL (ref 70–99)
Glucose-Capillary: 86 mg/dL (ref 70–99)
Glucose-Capillary: 91 mg/dL (ref 70–99)
Glucose-Capillary: 97 mg/dL (ref 70–99)

## 2010-10-16 LAB — COMPREHENSIVE METABOLIC PANEL
ALT: 13 U/L (ref 0–53)
AST: 24 U/L (ref 0–37)
Albumin: 3.2 g/dL — ABNORMAL LOW (ref 3.5–5.2)
Alkaline Phosphatase: 39 U/L (ref 39–117)
BUN: 45 mg/dL — ABNORMAL HIGH (ref 6–23)
CO2: 16 mEq/L — ABNORMAL LOW (ref 19–32)
Calcium: 8.5 mg/dL (ref 8.4–10.5)
Chloride: 99 mEq/L (ref 96–112)
Creatinine, Ser: 2.34 mg/dL — ABNORMAL HIGH (ref 0.4–1.5)
GFR calc Af Amer: 33 mL/min — ABNORMAL LOW (ref >60)
GFR calc non Af Amer: 27 mL/min — ABNORMAL LOW (ref >60)
Glucose, Bld: 225 mg/dL — ABNORMAL HIGH (ref 70–99)
Potassium: 5.5 mEq/L — ABNORMAL HIGH (ref 3.5–5.1)
Sodium: 128 mEq/L — ABNORMAL LOW (ref 135–145)
Total Bilirubin: 0.7 mg/dL (ref 0.3–1.2)
Total Protein: 6 g/dL (ref 6.0–8.3)

## 2010-10-16 LAB — URINE CULTURE
Colony Count: NO GROWTH
Culture  Setup Time: 201111280012
Culture: NO GROWTH

## 2010-10-16 LAB — DIFFERENTIAL
Basophils Absolute: 0 10*3/uL (ref 0.0–0.1)
Basophils Relative: 0 % (ref 0–1)
Eosinophils Absolute: 0.1 10*3/uL (ref 0.0–0.7)
Eosinophils Relative: 1 % (ref 0–5)
Lymphocytes Relative: 7 % — ABNORMAL LOW (ref 12–46)
Lymphs Abs: 1 10*3/uL (ref 0.7–4.0)
Monocytes Absolute: 0.6 10*3/uL (ref 0.1–1.0)
Monocytes Relative: 4 % (ref 3–12)
Neutro Abs: 12.1 10*3/uL — ABNORMAL HIGH (ref 1.7–7.7)
Neutrophils Relative %: 88 % — ABNORMAL HIGH (ref 43–77)

## 2010-10-16 LAB — CULTURE, BLOOD (ROUTINE X 2)
Culture  Setup Time: 201111261724
Culture  Setup Time: 201111261725
Culture: NO GROWTH
Culture: NO GROWTH

## 2010-10-16 LAB — CBC
HCT: 31.5 % — ABNORMAL LOW (ref 39.0–52.0)
HCT: 34.4 % — ABNORMAL LOW (ref 39.0–52.0)
HCT: 38 % — ABNORMAL LOW (ref 39.0–52.0)
HCT: 39.3 % (ref 39.0–52.0)
Hemoglobin: 10.4 g/dL — ABNORMAL LOW (ref 13.0–17.0)
Hemoglobin: 11.5 g/dL — ABNORMAL LOW (ref 13.0–17.0)
Hemoglobin: 12.5 g/dL — ABNORMAL LOW (ref 13.0–17.0)
Hemoglobin: 13.2 g/dL (ref 13.0–17.0)
MCH: 30 pg (ref 26.0–34.0)
MCH: 30 pg (ref 26.0–34.0)
MCH: 30 pg (ref 26.0–34.0)
MCH: 30.3 pg (ref 26.0–34.0)
MCHC: 32.9 g/dL (ref 30.0–36.0)
MCHC: 33 g/dL (ref 30.0–36.0)
MCHC: 33.4 g/dL (ref 30.0–36.0)
MCHC: 33.6 g/dL (ref 30.0–36.0)
MCV: 89.8 fL (ref 78.0–100.0)
MCV: 90.1 fL (ref 78.0–100.0)
MCV: 90.8 fL (ref 78.0–100.0)
MCV: 91.3 fL (ref 78.0–100.0)
Platelets: 180 10*3/uL (ref 150–400)
Platelets: 230 10*3/uL (ref 150–400)
Platelets: 238 10*3/uL (ref 150–400)
Platelets: 246 10*3/uL (ref 150–400)
RBC: 3.47 MIL/uL — ABNORMAL LOW (ref 4.22–5.81)
RBC: 3.83 MIL/uL — ABNORMAL LOW (ref 4.22–5.81)
RBC: 4.16 MIL/uL — ABNORMAL LOW (ref 4.22–5.81)
RBC: 4.36 MIL/uL (ref 4.22–5.81)
RDW: 14.4 % (ref 11.5–15.5)
RDW: 14.4 % (ref 11.5–15.5)
RDW: 14.5 % (ref 11.5–15.5)
RDW: 14.8 % (ref 11.5–15.5)
WBC: 10.4 10*3/uL (ref 4.0–10.5)
WBC: 12.9 10*3/uL — ABNORMAL HIGH (ref 4.0–10.5)
WBC: 13.3 10*3/uL — ABNORMAL HIGH (ref 4.0–10.5)
WBC: 9.7 10*3/uL (ref 4.0–10.5)

## 2010-10-16 LAB — POCT I-STAT, CHEM 8
BUN: 48 mg/dL — ABNORMAL HIGH (ref 6–23)
Calcium, Ion: 1 mmol/L — ABNORMAL LOW (ref 1.12–1.32)
Chloride: 104 mEq/L (ref 96–112)
Creatinine, Ser: 2.5 mg/dL — ABNORMAL HIGH (ref 0.4–1.5)
Glucose, Bld: 219 mg/dL — ABNORMAL HIGH (ref 70–99)
HCT: 38 % — ABNORMAL LOW (ref 39.0–52.0)
Hemoglobin: 12.9 g/dL — ABNORMAL LOW (ref 13.0–17.0)
Potassium: 5.5 mEq/L — ABNORMAL HIGH (ref 3.5–5.1)
Sodium: 128 mEq/L — ABNORMAL LOW (ref 135–145)
TCO2: 15 mmol/L (ref 0–100)

## 2010-10-16 LAB — BASIC METABOLIC PANEL
BUN: 14 mg/dL (ref 6–23)
BUN: 17 mg/dL (ref 6–23)
BUN: 30 mg/dL — ABNORMAL HIGH (ref 6–23)
BUN: 33 mg/dL — ABNORMAL HIGH (ref 6–23)
BUN: 40 mg/dL — ABNORMAL HIGH (ref 6–23)
CO2: 24 mEq/L (ref 19–32)
CO2: 24 mEq/L (ref 19–32)
CO2: 25 mEq/L (ref 19–32)
CO2: 26 mEq/L (ref 19–32)
CO2: 30 mEq/L (ref 19–32)
Calcium: 8.2 mg/dL — ABNORMAL LOW (ref 8.4–10.5)
Calcium: 8.3 mg/dL — ABNORMAL LOW (ref 8.4–10.5)
Calcium: 8.3 mg/dL — ABNORMAL LOW (ref 8.4–10.5)
Calcium: 8.8 mg/dL (ref 8.4–10.5)
Calcium: 9.3 mg/dL (ref 8.4–10.5)
Chloride: 100 mEq/L (ref 96–112)
Chloride: 102 mEq/L (ref 96–112)
Chloride: 103 mEq/L (ref 96–112)
Chloride: 104 mEq/L (ref 96–112)
Chloride: 105 mEq/L (ref 96–112)
Creatinine, Ser: 1.2 mg/dL (ref 0.4–1.5)
Creatinine, Ser: 1.4 mg/dL (ref 0.4–1.5)
Creatinine, Ser: 1.78 mg/dL — ABNORMAL HIGH (ref 0.4–1.5)
Creatinine, Ser: 2.01 mg/dL — ABNORMAL HIGH (ref 0.4–1.5)
Creatinine, Ser: 2.15 mg/dL — ABNORMAL HIGH (ref 0.4–1.5)
GFR calc Af Amer: 37 mL/min — ABNORMAL LOW (ref >60)
GFR calc Af Amer: 39 mL/min — ABNORMAL LOW (ref >60)
GFR calc Af Amer: 45 mL/min — ABNORMAL LOW (ref >60)
GFR calc Af Amer: 60 mL/min — ABNORMAL LOW (ref >60)
GFR calc Af Amer: >60 mL/min (ref >60)
GFR calc non Af Amer: 30 mL/min — ABNORMAL LOW (ref >60)
GFR calc non Af Amer: 33 mL/min — ABNORMAL LOW (ref >60)
GFR calc non Af Amer: 38 mL/min — ABNORMAL LOW (ref >60)
GFR calc non Af Amer: 50 mL/min — ABNORMAL LOW (ref >60)
GFR calc non Af Amer: 59 mL/min — ABNORMAL LOW (ref >60)
Glucose, Bld: 106 mg/dL — ABNORMAL HIGH (ref 70–99)
Glucose, Bld: 117 mg/dL — ABNORMAL HIGH (ref 70–99)
Glucose, Bld: 121 mg/dL — ABNORMAL HIGH (ref 70–99)
Glucose, Bld: 91 mg/dL (ref 70–99)
Glucose, Bld: 97 mg/dL (ref 70–99)
Potassium: 3.7 mEq/L (ref 3.5–5.1)
Potassium: 4.2 mEq/L (ref 3.5–5.1)
Potassium: 4.5 mEq/L (ref 3.5–5.1)
Potassium: 4.8 mEq/L (ref 3.5–5.1)
Potassium: 5.8 mEq/L — ABNORMAL HIGH (ref 3.5–5.1)
Sodium: 130 mEq/L — ABNORMAL LOW (ref 135–145)
Sodium: 137 mEq/L (ref 135–145)
Sodium: 138 mEq/L (ref 135–145)
Sodium: 138 mEq/L (ref 135–145)
Sodium: 139 mEq/L (ref 135–145)

## 2010-10-16 LAB — URINE MICROSCOPIC-ADD ON

## 2010-10-16 LAB — BRAIN NATRIURETIC PEPTIDE
Pro B Natriuretic peptide (BNP): 1371 pg/mL — ABNORMAL HIGH (ref 0.0–100.0)
Pro B Natriuretic peptide (BNP): 85 pg/mL (ref 0.0–100.0)

## 2010-10-16 LAB — TSH: TSH: 1.076 u[IU]/mL (ref 0.350–4.500)

## 2010-10-16 LAB — URINALYSIS, ROUTINE W REFLEX MICROSCOPIC
Bilirubin Urine: NEGATIVE
Glucose, UA: NEGATIVE mg/dL
Ketones, ur: NEGATIVE mg/dL
Nitrite: NEGATIVE
Protein, ur: NEGATIVE mg/dL
Specific Gravity, Urine: 1.019 (ref 1.005–1.030)
Urobilinogen, UA: 1 mg/dL (ref 0.0–1.0)
pH: 6.5 (ref 5.0–8.0)

## 2010-10-16 LAB — CK TOTAL AND CKMB (NOT AT ARMC)
CK, MB: 7.7 ng/mL (ref 0.3–4.0)
CK, MB: 8.5 ng/mL (ref 0.3–4.0)
CK, MB: 8.8 ng/mL (ref 0.3–4.0)
Relative Index: 5 — ABNORMAL HIGH (ref 0.0–2.5)
Relative Index: 5.1 — ABNORMAL HIGH (ref 0.0–2.5)
Relative Index: 5.4 — ABNORMAL HIGH (ref 0.0–2.5)
Total CK: 142 U/L (ref 7–232)
Total CK: 169 U/L (ref 7–232)
Total CK: 172 U/L (ref 7–232)

## 2010-10-16 LAB — APTT: aPTT: 36 seconds (ref 24–37)

## 2010-10-16 LAB — PROTIME-INR
INR: 1.36 (ref 0.00–1.49)
INR: 1.64 — ABNORMAL HIGH (ref 0.00–1.49)
Prothrombin Time: 17 seconds — ABNORMAL HIGH (ref 11.6–15.2)
Prothrombin Time: 19.6 seconds — ABNORMAL HIGH (ref 11.6–15.2)

## 2010-10-16 LAB — MRSA PCR SCREENING: MRSA by PCR: NEGATIVE

## 2010-10-16 LAB — POCT CARDIAC MARKERS
CKMB, poc: 2.3 ng/mL (ref 1.0–8.0)
Myoglobin, poc: 208 ng/mL (ref 12–200)
Troponin i, poc: <0.05 ng/mL (ref 0.00–0.09)

## 2010-10-16 LAB — TROPONIN I
Troponin I: 0.16 ng/mL — ABNORMAL HIGH (ref 0.00–0.06)
Troponin I: 0.22 ng/mL — ABNORMAL HIGH (ref 0.00–0.06)
Troponin I: 0.25 ng/mL — ABNORMAL HIGH (ref 0.00–0.06)

## 2010-10-16 LAB — POTASSIUM: Potassium: 4.9 mEq/L (ref 3.5–5.1)

## 2010-10-17 ENCOUNTER — Encounter (INDEPENDENT_AMBULATORY_CARE_PROVIDER_SITE_OTHER): Payer: Medicare Other

## 2010-10-17 ENCOUNTER — Encounter: Payer: Self-pay | Admitting: Cardiology

## 2010-10-17 DIAGNOSIS — I80299 Phlebitis and thrombophlebitis of other deep vessels of unspecified lower extremity: Secondary | ICD-10-CM

## 2010-10-17 DIAGNOSIS — Z7901 Long term (current) use of anticoagulants: Secondary | ICD-10-CM

## 2010-10-17 LAB — CONVERTED CEMR LAB: POC INR: 1.9

## 2010-10-23 NOTE — Medication Information (Signed)
Summary: rov/ewj  Anticoagulant Therapy  Managed by: Windell Hummingbird, RN Referring MD: Tonny Bollman MD. Supervising MD: Riley Kill MD, Maisie Fus Indication 1: DVT Lab Used: [facility] [place] Site: [address] INR POC 1.9 INR RANGE 2-3  Dietary changes: no    Health status changes: no    Bleeding/hemorrhagic complications: no    Recent/future hospitalizations: no    Any changes in medication regimen? no    Recent/future dental: no  Any missed doses?: no       Is patient compliant with meds? yes       Allergies: No Known Drug Allergies  Anticoagulation Management History:      The patient is taking warfarin and comes in today for a routine follow up visit.  Positive risk factors for bleeding include an age of 75 years or older and presence of serious comorbidities.  The bleeding index is 'intermediate risk'.  Positive CHADS2 values include History of HTN and History of Diabetes.  Negative CHADS2 values include Age > 42 years old.  Anticoagulation responsible provider: Riley Kill MD, Maisie Fus.  INR POC: 1.9.  Cuvette Lot#: [id number].  Exp: 09/2011.    Anticoagulation Management Assessment/Plan:      The patient's current anticoagulation dose is Warfarin sodium 4 mg tabs: take one tablet by mouth every evening.  The target INR is 2.0-3.0.  The next INR is due 10/30/2010.  Anticoagulation instructions were given to patient/wife.  Results were reviewed/authorized by Windell Hummingbird, RN.  He was notified by Windell Hummingbird, RN.         Prior Anticoagulation Instructions: INR 1.8  Take an extra 1/2 tablet today, then resume same dosage 1 tablet daily except 1/2 tablet on Sundays, Tuesdays, and Thursdays.  Recheck in 2 weeks.    Current Anticoagulation Instructions: INR 1.9 Continue taking 1 tablet every day, except take 1/2 tablet on Sundays and Tuesdays. Recheck in 2 weeks.

## 2010-10-30 ENCOUNTER — Encounter: Payer: Self-pay | Admitting: Internal Medicine

## 2010-10-30 ENCOUNTER — Ambulatory Visit (INDEPENDENT_AMBULATORY_CARE_PROVIDER_SITE_OTHER): Payer: Medicare Other | Admitting: *Deleted

## 2010-10-30 DIAGNOSIS — I82409 Acute embolism and thrombosis of unspecified deep veins of unspecified lower extremity: Secondary | ICD-10-CM

## 2010-10-30 DIAGNOSIS — Z7901 Long term (current) use of anticoagulants: Secondary | ICD-10-CM | POA: Insufficient documentation

## 2010-10-30 LAB — POCT INR: INR: 2.6

## 2010-10-30 NOTE — Patient Instructions (Signed)
INR 2.6 Continue taking 1 tablet (4 mg) daily, except take 1/2 tablet (2 mg) on Sundays and Tuesdays. Recheck in 4 weeks.

## 2010-11-27 ENCOUNTER — Ambulatory Visit (INDEPENDENT_AMBULATORY_CARE_PROVIDER_SITE_OTHER): Payer: Medicare Other | Admitting: *Deleted

## 2010-11-27 DIAGNOSIS — I82409 Acute embolism and thrombosis of unspecified deep veins of unspecified lower extremity: Secondary | ICD-10-CM

## 2010-11-27 LAB — POCT INR: INR: 1.9

## 2010-11-27 MED ORDER — WARFARIN SODIUM 4 MG PO TABS: 4.0000 mg | ORAL_TABLET | ORAL | Status: DC

## 2010-12-11 ENCOUNTER — Ambulatory Visit (INDEPENDENT_AMBULATORY_CARE_PROVIDER_SITE_OTHER): Payer: Medicare Other | Admitting: Internal Medicine

## 2010-12-11 ENCOUNTER — Encounter: Payer: Self-pay | Admitting: Internal Medicine

## 2010-12-11 DIAGNOSIS — I959 Hypotension, unspecified: Secondary | ICD-10-CM | POA: Insufficient documentation

## 2010-12-11 DIAGNOSIS — Z950 Presence of cardiac pacemaker: Secondary | ICD-10-CM

## 2010-12-11 DIAGNOSIS — I442 Atrioventricular block, complete: Secondary | ICD-10-CM

## 2010-12-11 NOTE — Assessment & Plan Note (Signed)
Stable post pacer 

## 2010-12-11 NOTE — Progress Notes (Signed)
  HPI  Gerald Hardin is a 77 y.o. male Seen following pacemaker implantation for complete heart block December 2012. At that time he presented with syncope.  He also has a history  of diabetes, hypertension, hyperlipidemia, COPD, and right axillary squamous cell cancer.  He is doing well. He has had episodes of profound weakness. These have occurred postprandially primarily in the evenings. On one occasion his blood pressure was recorded over 70 or so systolic.  Past Medical History  Diagnosis Date  . Hypertension   . Hyperlipidemia   . Diabetes mellitus   . COPD (chronic obstructive pulmonary disease)   . CKD (chronic kidney disease), stage II   . CHB (complete heart block) nov 2011  . CA - cancer     Right Axillary Squamous Cell    Past Surgical History  Procedure Date  . Pacemaker placement Jul 02 2010  . Squamous cell carcinoma excision     and XRT  . Xrt     Current Outpatient Prescriptions  Medication Sig Dispense Refill  . acetaminophen (TYLENOL) 500 MG tablet Take 500 mg by mouth daily.        Marland Kitchen aspirin 81 MG tablet Take 81 mg by mouth daily.        Marland Kitchen b complex vitamins tablet Take 1 tablet by mouth daily.        . Coenzyme Q10 (COQ10 PO) Take by mouth every morning.        Marland Kitchen Dextromethorphan-Guaifenesin (MUCINEX DM PO) Take by mouth 2 (two) times daily.        . fenofibrate (TRICOR) 145 MG tablet Take 145 mg by mouth at bedtime.        . furosemide (LASIX) 20 MG tablet Take 20 mg by mouth daily.        . Glucosamine-Chondroitin 250-200 MG CAPS Take by mouth. Take two tablets every morning       . Lactobacillus (ACIDOPHILUS) CAPS Take by mouth. Take 1-3 capsules three times a day with meals       . niacin 100 MG tablet Take 100 mg by mouth 2 (two) times daily with a meal.        . pioglitazone (ACTOS) 15 MG tablet Take 15 mg by mouth daily.        . repaglinide (PRANDIN) 2 MG tablet Take 2 mg by mouth 3 (three) times daily before meals.        . simvastatin (ZOCOR)  40 MG tablet Take 40 mg by mouth at bedtime.        . trandolapril-verapamil (TARKA) 4-240 MG per tablet Take 1 tablet by mouth at bedtime.        Marland Kitchen warfarin (COUMADIN) 4 MG tablet Take 1 tablet (4 mg total) by mouth as directed.  30 tablet  3    No Known Allergies  Review of Systems negative except from HPI and PMH  Physical Exam Well developed and well nourished in no acute distress HENT normal E scleral and icterus clear Neck Supple JVP flat; carotids brisk and full Clear to ausculation Regular rate and rhythm, no murmurs gallops or rub Soft with active bowel sounds No clubbing cyanosis ; trace edema bilaterally. His right hand is in a splint and there is atrophy. Alert and oriented, grossly normal motor and sensory function Skin Warm and Dry, dependent    Assessment and  Plan

## 2010-12-11 NOTE — Assessment & Plan Note (Signed)
As noted

## 2010-12-11 NOTE — Assessment & Plan Note (Signed)
Will continue warfarin but will discontinue aspirin at this point as the data suggests increased risk without incremental benefit

## 2010-12-11 NOTE — Patient Instructions (Signed)
Your physician has recommended you make the following change in your medication:  1) Stop aspirin. 2) Take lasix 20mg  once daily as needed. 3) Decrease Tarka (rtrandolapril-verapamil) to 4-240mg  1/2 tablet daily.  Your physician wants you to follow-up in: 6 months. You will receive a reminder letter in the mail two months in advance. If you don't receive a letter, please call our office to schedule the follow-up appointment.

## 2010-12-11 NOTE — Assessment & Plan Note (Signed)
There appears to be a postprandial component to this. I have decreased his diuretic to take as needed and will also cut his Tarka and half.  Postprandial hypotension is commonly seen with meals that are large, fatty, or hot. He will try to adjust his meals and see how he does.

## 2010-12-11 NOTE — Assessment & Plan Note (Signed)
The patient's device was interrogated and the information was fully reviewed.  The device was reprogrammed to maximize longevity estimated at 10 years

## 2010-12-20 ENCOUNTER — Ambulatory Visit (INDEPENDENT_AMBULATORY_CARE_PROVIDER_SITE_OTHER): Payer: Medicare Other | Admitting: Internal Medicine

## 2010-12-20 DIAGNOSIS — I82409 Acute embolism and thrombosis of unspecified deep veins of unspecified lower extremity: Secondary | ICD-10-CM

## 2010-12-20 LAB — POCT INR: INR: 2.5

## 2011-01-04 DEATH — deceased

## 2011-01-17 ENCOUNTER — Encounter: Payer: Medicare Other | Admitting: *Deleted

## 2011-01-22 ENCOUNTER — Ambulatory Visit (INDEPENDENT_AMBULATORY_CARE_PROVIDER_SITE_OTHER): Payer: Medicare Other | Admitting: *Deleted

## 2011-01-22 DIAGNOSIS — I82409 Acute embolism and thrombosis of unspecified deep veins of unspecified lower extremity: Secondary | ICD-10-CM

## 2011-01-22 LAB — POCT INR: INR: 2

## 2011-02-26 ENCOUNTER — Encounter: Payer: Medicare Other | Admitting: *Deleted

## 2011-02-28 ENCOUNTER — Ambulatory Visit (INDEPENDENT_AMBULATORY_CARE_PROVIDER_SITE_OTHER): Payer: Medicare Other | Admitting: *Deleted

## 2011-02-28 DIAGNOSIS — I82409 Acute embolism and thrombosis of unspecified deep veins of unspecified lower extremity: Secondary | ICD-10-CM

## 2011-02-28 LAB — POCT INR: INR: 2.7

## 2011-03-28 ENCOUNTER — Ambulatory Visit (INDEPENDENT_AMBULATORY_CARE_PROVIDER_SITE_OTHER): Payer: Medicare Other | Admitting: *Deleted

## 2011-03-28 DIAGNOSIS — I82409 Acute embolism and thrombosis of unspecified deep veins of unspecified lower extremity: Secondary | ICD-10-CM

## 2011-03-28 LAB — POCT INR: INR: 2.7

## 2011-04-23 ENCOUNTER — Encounter: Payer: Medicare Other | Admitting: *Deleted

## 2011-04-29 ENCOUNTER — Ambulatory Visit (INDEPENDENT_AMBULATORY_CARE_PROVIDER_SITE_OTHER): Payer: Medicare Other | Admitting: *Deleted

## 2011-04-29 DIAGNOSIS — I82409 Acute embolism and thrombosis of unspecified deep veins of unspecified lower extremity: Secondary | ICD-10-CM

## 2011-04-29 LAB — POCT INR: INR: 2.8

## 2011-04-29 MED ORDER — WARFARIN SODIUM 4 MG PO TABS: 4.0000 mg | ORAL_TABLET | ORAL | Status: DC

## 2011-05-28 ENCOUNTER — Ambulatory Visit (INDEPENDENT_AMBULATORY_CARE_PROVIDER_SITE_OTHER): Payer: Medicare Other | Admitting: *Deleted

## 2011-05-28 DIAGNOSIS — Z7901 Long term (current) use of anticoagulants: Secondary | ICD-10-CM

## 2011-05-28 DIAGNOSIS — I82409 Acute embolism and thrombosis of unspecified deep veins of unspecified lower extremity: Secondary | ICD-10-CM

## 2011-05-28 LAB — POCT INR: INR: 3.4

## 2011-06-04 ENCOUNTER — Encounter: Payer: Self-pay | Admitting: Internal Medicine

## 2011-06-07 ENCOUNTER — Ambulatory Visit (INDEPENDENT_AMBULATORY_CARE_PROVIDER_SITE_OTHER): Payer: Medicare Other | Admitting: Internal Medicine

## 2011-06-07 ENCOUNTER — Encounter: Payer: Self-pay | Admitting: Internal Medicine

## 2011-06-07 VITALS — BP 120/52 | HR 77 | Ht 72.0 in | Wt 199.0 lb

## 2011-06-07 DIAGNOSIS — I442 Atrioventricular block, complete: Secondary | ICD-10-CM

## 2011-06-07 DIAGNOSIS — E785 Hyperlipidemia, unspecified: Secondary | ICD-10-CM

## 2011-06-07 DIAGNOSIS — I82409 Acute embolism and thrombosis of unspecified deep veins of unspecified lower extremity: Secondary | ICD-10-CM

## 2011-06-07 DIAGNOSIS — Z950 Presence of cardiac pacemaker: Secondary | ICD-10-CM

## 2011-06-07 LAB — PACEMAKER DEVICE OBSERVATION
AL AMPLITUDE: 3.4 mv
AL IMPEDENCE PM: 400 Ohm
AL THRESHOLD: 0.875 V
ATRIAL PACING PM: 20
BAMS-0001: 150 {beats}/min
BAMS-0003: 70 {beats}/min
BATTERY VOLTAGE: 2.9779 V
DEVICE MODEL PM: 7132800
RV LEAD AMPLITUDE: 12 mv
RV LEAD IMPEDENCE PM: 537.5 Ohm
RV LEAD THRESHOLD: 0.75 V
VENTRICULAR PACING PM: 42

## 2011-06-07 NOTE — Patient Instructions (Addendum)
Your physician recommends that you schedule a follow-up appointment in: 1 year with Dr. Graciela Husbands Your physician recommends that you schedule a follow-up appointment in: 6 months with Gunnar Fusi  Decrease Simvastatin to 10mg  daily due to interaction with Verapamil.    You may STOP taking Warfarin.

## 2011-06-07 NOTE — Assessment & Plan Note (Signed)
Post pacer upper exttremity swelling  Now 10 months  Can ctop coumadin

## 2011-06-07 NOTE — Assessment & Plan Note (Signed)
The patient's device was interrogated.  The information was reviewed. No changes were made in the programming.    

## 2011-06-07 NOTE — Assessment & Plan Note (Signed)
Interaction with verapamil and simva   Will decrease to 10 and defer decision re long term alternative to his PCP

## 2011-06-07 NOTE — Progress Notes (Signed)
  HPI  Gerald Michael. is a 77 y.o. male  Seen following pacemaker implantation for complete heart block and syncope in  December 2012. At that time he presented with syncope.   He also has a history of diabetes, hypertension, hyperlipidemia, COPD, and right axillary squamous cell cancer.  He is doing well. Postprandial hypotension episodes seem to be better \ No significant SOB/ Past Medical History  Diagnosis Date  . Hypertension   . Hyperlipidemia   . Diabetes mellitus   . COPD (chronic obstructive pulmonary disease)   . CKD (chronic kidney disease), stage II   . CHB (complete heart block) nov 2011  . CA - cancer     Right Axillary Squamous Cell    Past Surgical History  Procedure Date  . Pacemaker placement Jul 02 2010  . Squamous cell carcinoma excision     and XRT  . Xrt     Current Outpatient Prescriptions  Medication Sig Dispense Refill  . acetaminophen (TYLENOL) 500 MG tablet Take 500 mg by mouth daily.        Marland Kitchen b complex vitamins tablet Take 1 tablet by mouth daily.        . Coenzyme Q10 (COQ10 PO) Take by mouth every morning.        Marland Kitchen Dextromethorphan-Guaifenesin (MUCINEX DM PO) Take by mouth 2 (two) times daily.        . fenofibrate (TRICOR) 145 MG tablet Take 145 mg by mouth at bedtime.        . furosemide (LASIX) 20 MG tablet Take 1 tablet daily as needed      . Glucosamine-Chondroitin 250-200 MG CAPS Take by mouth. Take two tablets every morning       . Lactobacillus (ACIDOPHILUS) CAPS Take by mouth. Take 1-3 capsules three times a day with meals       . niacin 100 MG tablet Take 100 mg by mouth 2 (two) times daily with a meal.        . pioglitazone (ACTOS) 15 MG tablet Take 15 mg by mouth daily.        . repaglinide (PRANDIN) 2 MG tablet Take 2 mg by mouth 3 (three) times daily before meals.        . simvastatin (ZOCOR) 40 MG tablet Take 40 mg by mouth at bedtime.        . trandolapril-verapamil (TARKA) 4-240 MG per tablet Take 1/2 tablet daily      .  warfarin (COUMADIN) 4 MG tablet Take 1 tablet (4 mg total) by mouth as directed.  90 tablet  1    No Known Allergies  Review of Systems negative except from HPI and PMH  Physical Exam Well developed and well nourished in no acute distress HENT normal with poor dentition E scleral and icterus clear Neck Supple JVP flat; carotids brisk and full Clear to ausculation Regular rate and rhythm, no murmurs gallops or rub Soft with active bowel sounds No clubbing cyanosis and edema Alert and oriented, grossly normal motor and sensory function; right hand in splint Skin Warm and Dry   Assessment and  Plan

## 2011-06-12 ENCOUNTER — Encounter: Payer: Medicare Other | Admitting: Internal Medicine

## 2011-06-24 ENCOUNTER — Ambulatory Visit: Payer: Self-pay | Admitting: Cardiology

## 2011-06-24 DIAGNOSIS — Z7901 Long term (current) use of anticoagulants: Secondary | ICD-10-CM

## 2011-06-24 DIAGNOSIS — I82409 Acute embolism and thrombosis of unspecified deep veins of unspecified lower extremity: Secondary | ICD-10-CM

## 2011-07-23 ENCOUNTER — Encounter: Payer: Medicare Other | Admitting: *Deleted

## 2011-12-11 ENCOUNTER — Encounter: Payer: Self-pay | Admitting: Internal Medicine

## 2011-12-11 ENCOUNTER — Ambulatory Visit (INDEPENDENT_AMBULATORY_CARE_PROVIDER_SITE_OTHER): Payer: Medicare Other | Admitting: *Deleted

## 2011-12-11 DIAGNOSIS — I442 Atrioventricular block, complete: Secondary | ICD-10-CM

## 2011-12-11 LAB — PACEMAKER DEVICE OBSERVATION
AL AMPLITUDE: 2.1 mv
AL IMPEDENCE PM: 412.5 Ohm
AL THRESHOLD: 0.875 V
ATRIAL PACING PM: 11
BAMS-0001: 150 {beats}/min
BAMS-0003: 70 {beats}/min
BATTERY VOLTAGE: 2.9779 V
DEVICE MODEL PM: 7132800
RV LEAD AMPLITUDE: 12 mv
RV LEAD IMPEDENCE PM: 525 Ohm
RV LEAD THRESHOLD: 0.875 V
VENTRICULAR PACING PM: 37

## 2011-12-11 NOTE — Progress Notes (Signed)
PPM check 

## 2011-12-18 ENCOUNTER — Telehealth: Payer: Self-pay | Admitting: Internal Medicine

## 2011-12-18 NOTE — Telephone Encounter (Signed)
Spoke w/pt and will have Dr Graciela Husbands review device check from 12-11-11 and make copy for pt to come by and get to take to [facility] doctors. Will call pt if any changes are to be made.

## 2011-12-18 NOTE — Telephone Encounter (Signed)
Please return call to patient at [phone] regarding the copy of the reports patient requested for his [place] primary care physician. Patient is leaving for [place] this weekend.

## 2012-03-12 ENCOUNTER — Telehealth: Payer: Self-pay | Admitting: Internal Medicine

## 2012-03-12 NOTE — Telephone Encounter (Signed)
New msg Pt is having cataract surgery and he wants to talk to you about this. Please call

## 2012-03-12 NOTE — Telephone Encounter (Signed)
I spoke with the patient. He is needing clearance for cataract surgery to be done most likely in [place]. He saw Dr. Graciela Husbands in 06/2011 and is not due for follow up until 06/2012. He has a St. Jude PPM. He is no longer on warfarin. He does not have a surgical date yet. He is supposed to go visit the clinic in [place] next week. He will call back for sure and let me know about this, however, if he does go to [place], he is thinking this will be around 04/13/12. He needs to know if he needs an office visit with Dr. Graciela Husbands, or will he clear him without a visit. I will forward to Dr. Graciela Husbands for review.

## 2012-03-20 NOTE — Telephone Encounter (Signed)
He will need an evaluation for clearance

## 2012-03-24 NOTE — Telephone Encounter (Signed)
The patient is aware he will need to be seen prior to cataract surgery. His appointment is set for 8/29 with Dr. Graciela Husbands at 12:00 pm.

## 2012-04-02 ENCOUNTER — Ambulatory Visit (INDEPENDENT_AMBULATORY_CARE_PROVIDER_SITE_OTHER): Payer: Medicare Other | Admitting: Internal Medicine

## 2012-04-02 ENCOUNTER — Encounter: Payer: Self-pay | Admitting: Internal Medicine

## 2012-04-02 VITALS — BP 120/63 | HR 79 | Ht 71.5 in | Wt 202.0 lb

## 2012-04-02 DIAGNOSIS — I442 Atrioventricular block, complete: Secondary | ICD-10-CM

## 2012-04-02 DIAGNOSIS — I10 Essential (primary) hypertension: Secondary | ICD-10-CM

## 2012-04-02 DIAGNOSIS — Z01818 Encounter for other preprocedural examination: Secondary | ICD-10-CM

## 2012-04-02 DIAGNOSIS — F17200 Nicotine dependence, unspecified, uncomplicated: Secondary | ICD-10-CM

## 2012-04-02 DIAGNOSIS — Z950 Presence of cardiac pacemaker: Secondary | ICD-10-CM

## 2012-04-02 NOTE — Assessment & Plan Note (Signed)
Continues to smoke.

## 2012-04-02 NOTE — Assessment & Plan Note (Signed)
The patient is stable cardiovascularly. Left ventricular function is normal;  electrode cardiogram is unchanged; functional status his limited

## 2012-04-02 NOTE — Assessment & Plan Note (Signed)
The patient's device was interrogated.  The information was reviewed. No changes were made in the programming.    

## 2012-04-02 NOTE — Progress Notes (Signed)
HPI  Gerald Mallis. is a 78 y.o. male  Seen following pacemaker implantation for complete heart block and syncope in  December 2012. At that time he presented with syncope.   Echocardiogram November 2011 demonstrated normal left ventricular function without wall motion abnormalities. It mildly elevated pulmonary pressures  He also has a history of diabetes, hypertension, hyperlipidemia, COPD, and right axillary squamous cell cancer.   He denies significant chest pain or shortness of breath. He had no peripheral edema.     Past Medical History  Diagnosis Date  . Hypertension   . Hyperlipidemia   . Diabetes mellitus   . COPD (chronic obstructive pulmonary disease)   . CKD (chronic kidney disease), stage II   . CHB (complete heart block) nov 2011  . CA - cancer     Right Axillary Squamous Cell  . Pacemaker     st judes  . DVT of upper extremity (deep vein thrombosis)     post pacer    Past Surgical History  Procedure Date  . Pacemaker placement Jul 02 2010  . Squamous cell carcinoma excision     and XRT  . Xrt     Current Outpatient Prescriptions  Medication Sig Dispense Refill  . acetaminophen (TYLENOL) 500 MG tablet Take 500 mg by mouth daily.        Marland Kitchen aspirin 81 MG tablet Take 81 mg by mouth daily.      Marland Kitchen b complex vitamins tablet Take 1 tablet by mouth daily.        . Coenzyme Q10 (COQ10 PO) Take by mouth every morning.        Marland Kitchen Dextromethorphan-Guaifenesin (MUCINEX DM PO) Take by mouth 2 (two) times daily.        . fenofibrate (TRICOR) 145 MG tablet Take 145 mg by mouth at bedtime.        . furosemide (LASIX) 20 MG tablet Take 1 tablet daily as needed      . Glucosamine-Chondroitin 250-200 MG CAPS Take by mouth. Take two tablets every morning       . Lactobacillus (ACIDOPHILUS) CAPS Take by mouth. Take 1-3 capsules three times a day with meals       . niacin 100 MG tablet Take 100 mg by mouth 2 (two) times daily with a meal.        . pioglitazone (ACTOS) 15 MG  tablet Take 15 mg by mouth daily.        . repaglinide (PRANDIN) 2 MG tablet Take 2 mg by mouth 3 (three) times daily before meals.        . simvastatin (ZOCOR) 10 MG tablet Take 1 tablet (10 mg total) by mouth at bedtime.  30 tablet  6  . trandolapril-verapamil (TARKA) 4-240 MG per tablet Take 1/2 tablet daily        No Known Allergies  Review of Systems negative except from HPI and PMH  Physical Exam BP 120/63  Pulse 79  Ht 5' 11.5" (1.816 m)  Wt 201 lb 15.7 oz (91.618 kg)  BMI 27.78 kg/m2  Well developed and well nourished in no acute distress HENT normal with poor dentition E scleral and icterus clear Neck Supple JVP flat; carotids brisk and full Clear to ausculation Regular rate and rhythm, no murmurs gallops or rub Soft with active bowel sounds No clubbing cyanosis and edema; he has atrophy of his right hand splint Alert and oriented, grossly normal motor and sensory function;   Skin Warm and  Dry  Electrocardiogram dated today demonstrates sinus rhythm at 71 intervals 19/11/42 axis is leftward at -99 there Q waves in III and F and the V1 and V2 this is consistent with pulmonary disease pattern And is unchanged  Assessment and  Plan

## 2012-04-02 NOTE — Patient Instructions (Addendum)
Your physician wants you to follow-up in: 6 months with Kristin/Paula in the device clinic & 1 year with Dr. Klein. You will receive a reminder letter in the mail two months in advance. If you don't receive a letter, please call our office to schedule the follow-up appointment.  Your physician recommends that you continue on your current medications as directed. Please refer to the Current Medication list given to you today.  

## 2012-04-02 NOTE — Assessment & Plan Note (Signed)
Intermittent

## 2012-04-02 NOTE — Assessment & Plan Note (Signed)
Well-controlled on current medications 

## 2012-04-09 ENCOUNTER — Encounter: Payer: Self-pay | Admitting: Internal Medicine

## 2012-04-22 IMAGING — CR DG CHEST 1V PORT
1 series · 1 of 1 positions shown · non-contrast
Comparison: None.

CLINICAL DATA: Bradycardia

PORTABLE CHEST - 1 VIEW

[view not recorded]
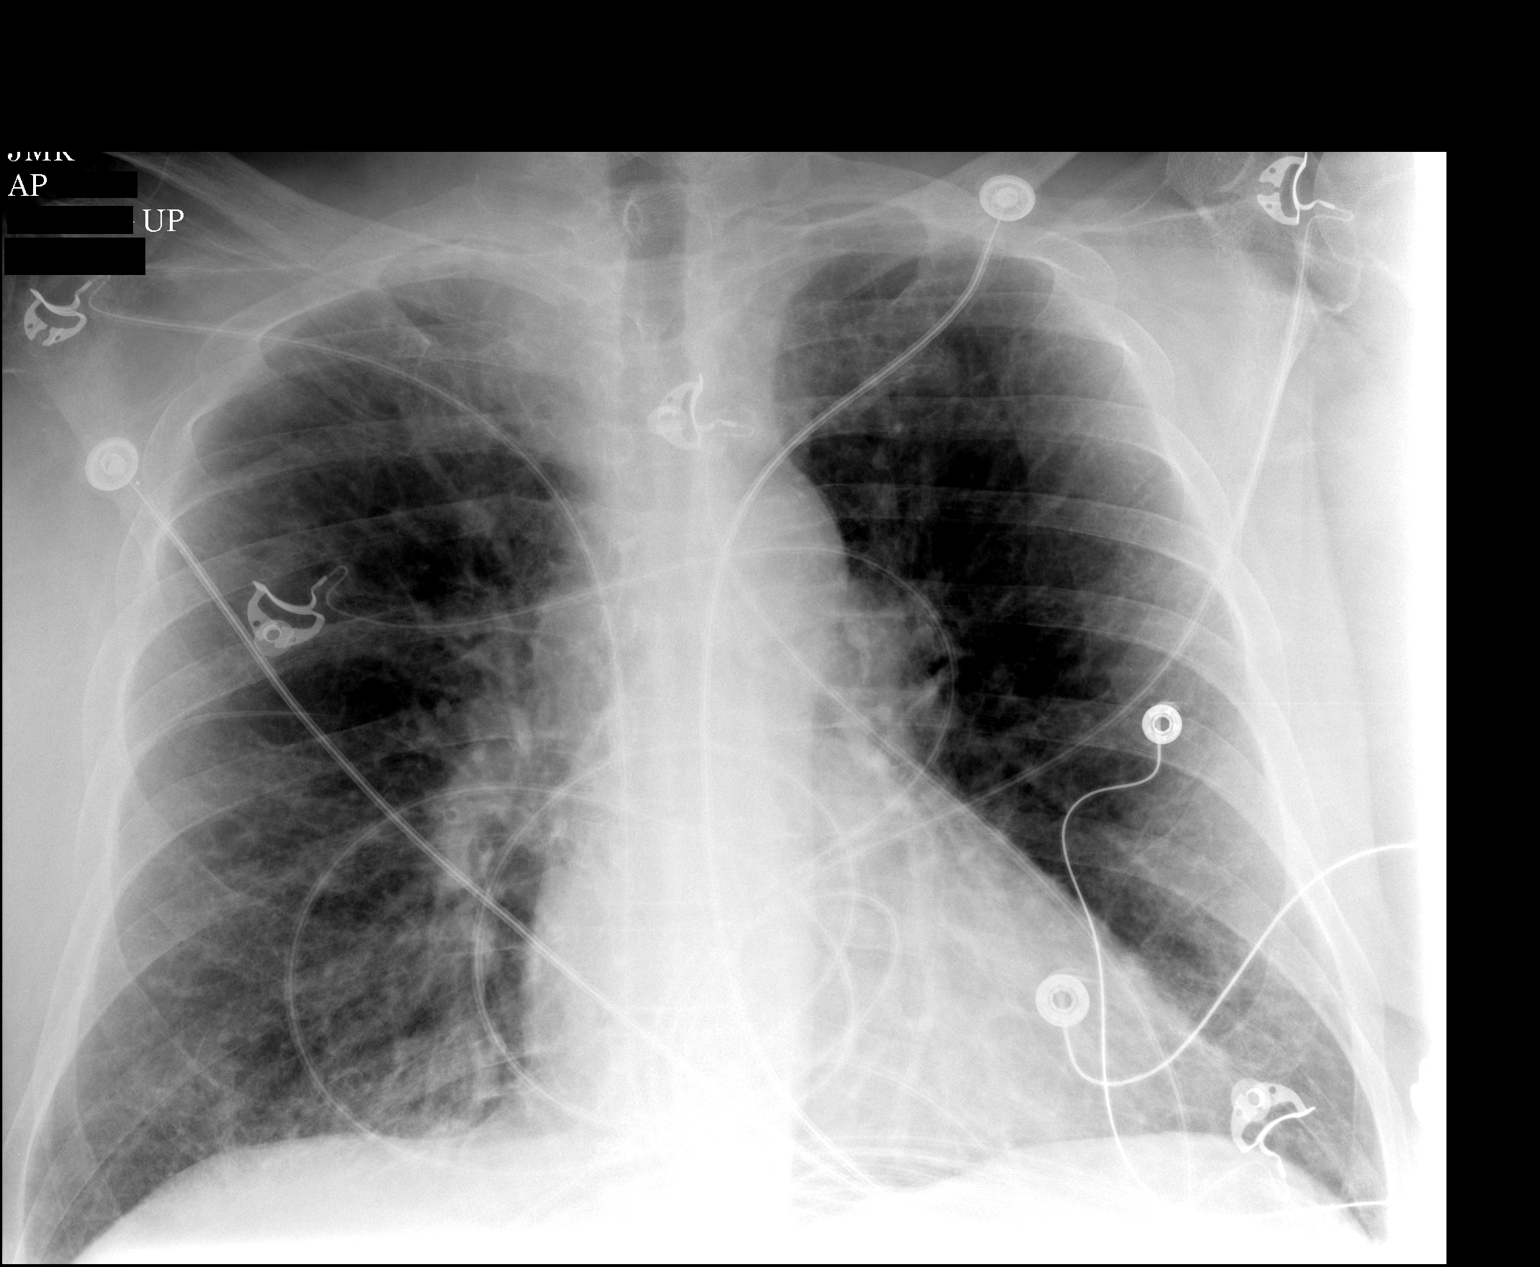

[1 of 1 positions shown; findings below may reference images not displayed]

FINDINGS: Heart is upper normal in size.  Vascular congestion.  No
consolidation, mass, pneumothorax, pleural fluid.  Low lung
volumes.
IMPRESSION: Vascular congestion without pulmonary edema.

## 2012-04-25 IMAGING — CR DG CHEST 1V PORT
1 series · 1 of 1 positions shown · non-contrast
Comparison: 06/30/2010

CLINICAL DATA: Pacemaker placement.

PORTABLE CHEST - 1 VIEW

[AP]
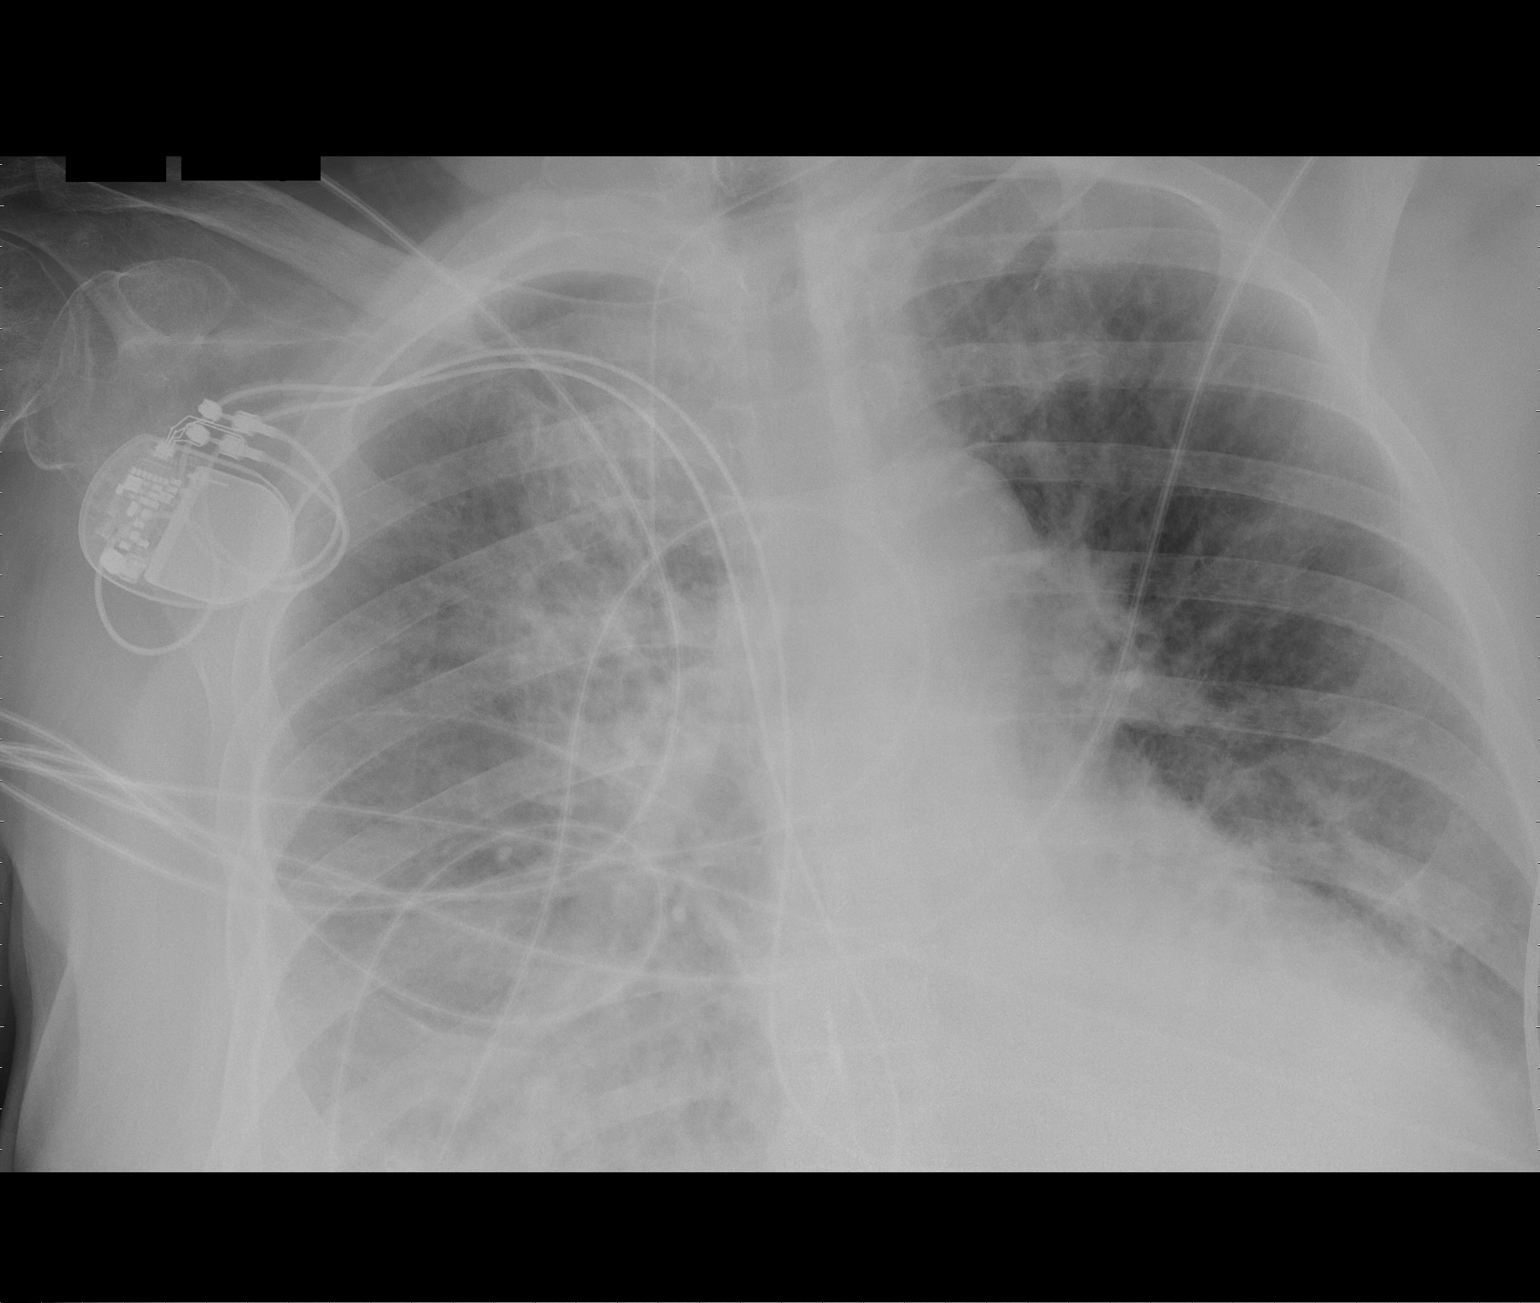

[1 of 1 positions shown; findings below may reference images not displayed]

FINDINGS: Right pacemaker has been placed with leads in the right
atrium and right ventricle.  No pneumothorax.  There is
cardiomegaly with worsening vascular congestion and bilateral
airspace opacities, right greater than left.  This is most
confluent in the right suprahilar region and right lung base.  This
could represent asymmetric edema, infection or aspiration.  Suspect
layering right effusion.
IMPRESSION: Right pacer placement as above.  No pneumothorax.

Worsening bilateral airspace disease, right greater than left.
This could represent asymmetric edema, infection or aspiration.

## 2012-09-30 ENCOUNTER — Encounter: Payer: Self-pay | Admitting: Internal Medicine

## 2012-09-30 ENCOUNTER — Other Ambulatory Visit: Payer: Self-pay

## 2012-09-30 ENCOUNTER — Ambulatory Visit (INDEPENDENT_AMBULATORY_CARE_PROVIDER_SITE_OTHER): Payer: Medicare Other | Admitting: *Deleted

## 2012-09-30 DIAGNOSIS — I442 Atrioventricular block, complete: Secondary | ICD-10-CM

## 2012-09-30 LAB — PACEMAKER DEVICE OBSERVATION
AL AMPLITUDE: 4.1 mv
AL IMPEDENCE PM: 387.5 Ohm
AL THRESHOLD: 0.875 V
ATRIAL PACING PM: 6.7
BAMS-0001: 150 {beats}/min
BAMS-0003: 70 {beats}/min
BATTERY VOLTAGE: 2.9629 V
DEVICE MODEL PM: 7132800
RV LEAD AMPLITUDE: 12 mv
RV LEAD IMPEDENCE PM: 512.5 Ohm
RV LEAD THRESHOLD: 0.75 V
VENTRICULAR PACING PM: 39

## 2012-09-30 NOTE — Progress Notes (Signed)
PPM check 

## 2013-04-13 ENCOUNTER — Ambulatory Visit (INDEPENDENT_AMBULATORY_CARE_PROVIDER_SITE_OTHER): Payer: Medicare Other | Admitting: Internal Medicine

## 2013-04-13 ENCOUNTER — Encounter: Payer: Self-pay | Admitting: Internal Medicine

## 2013-04-13 VITALS — BP 115/67 | HR 70 | Ht 72.0 in | Wt 196.8 lb

## 2013-04-13 DIAGNOSIS — Z950 Presence of cardiac pacemaker: Secondary | ICD-10-CM

## 2013-04-13 DIAGNOSIS — I442 Atrioventricular block, complete: Secondary | ICD-10-CM

## 2013-04-13 LAB — PACEMAKER DEVICE OBSERVATION
AL AMPLITUDE: 2.4 mv
AL IMPEDENCE PM: 325 Ohm
AL THRESHOLD: 0.75 V
ATRIAL PACING PM: 8.6
BAMS-0001: 150 {beats}/min
BAMS-0003: 70 {beats}/min
BATTERY VOLTAGE: 2.9629 V
DEVICE MODEL PM: 7132800
RV LEAD AMPLITUDE: 12 mv
RV LEAD IMPEDENCE PM: 437.5 Ohm
RV LEAD THRESHOLD: 0.875 V
VENTRICULAR PACING PM: 27

## 2013-04-13 NOTE — Progress Notes (Signed)
HPI  Gerald Hardin. is a 79 y.o. male  Seen following pacemaker implantation for complete heart block and syncope in  December 2012. At that time he presented with syncope.   Echocardiogram November 2011 demonstrated normal left ventricular function without wall motion abnormalities. It mildly elevated pulmonary pressures  He also has a history of diabetes, hypertension, hyperlipidemia, COPD, and right axillary squamous cell cancer.   He denies significant chest pain or shortness of breath. He had no peripheral edema.     Past Medical History  Diagnosis Date  . Hypertension   . Hyperlipidemia   . Diabetes mellitus   . COPD (chronic obstructive pulmonary disease)   . CKD (chronic kidney disease), stage II   . CHB (complete heart block) nov 2011  . CA - cancer     Right Axillary Squamous Cell  . Pacemaker     st judes  . DVT of upper extremity (deep vein thrombosis)     post pacer    Past Surgical History  Procedure Laterality Date  . Pacemaker placement  Jul 02 2010  . Squamous cell carcinoma excision      and XRT  . Xrt      Current Outpatient Prescriptions  Medication Sig Dispense Refill  . acetaminophen (TYLENOL) 500 MG tablet Take 500 mg by mouth daily.        Marland Kitchen aspirin 81 MG tablet Take 81 mg by mouth daily.      Marland Kitchen b complex vitamins tablet Take 1 tablet by mouth daily.        . Coenzyme Q10 (COQ10 PO) Take by mouth every morning.        Marland Kitchen Dextromethorphan-Guaifenesin (MUCINEX DM PO) Take by mouth 2 (two) times daily.        . fenofibrate (TRICOR) 145 MG tablet Take 145 mg by mouth at bedtime.        . Glucosamine-Chondroitin 250-200 MG CAPS Take by mouth. Take two tablets every morning       . Lactobacillus (ACIDOPHILUS) CAPS Take by mouth. Take 1-3 capsules three times a day with meals       . niacin 100 MG tablet Take 100 mg by mouth 2 (two) times daily with a meal.        . pioglitazone (ACTOS) 15 MG tablet Take 15 mg by mouth daily.        . repaglinide  (PRANDIN) 2 MG tablet Take 2 mg by mouth 3 (three) times daily before meals.        . simvastatin (ZOCOR) 10 MG tablet Take 1 tablet (10 mg total) by mouth at bedtime.  30 tablet  6  . trandolapril-verapamil (TARKA) 4-240 MG per tablet Take 1/2 tablet daily       No current facility-administered medications for this visit.    No Known Allergies  Review of Systems negative except from HPI and PMH  Physical Exam BP 115/67  Pulse 70  Ht 6' (1.829 m)  Wt 196 lb 12.8 oz (89.268 kg)  BMI 26.69 kg/m2  Well developed and well nourished in no acute distress HENT normal with poor dentition E scleral and icterus clear Neck Supple JVP flat; carotids brisk and full Clear to ausculation Regular rate and rhythm, no murmurs gallops or rub Soft with active bowel sounds No clubbing cyanosis and edema; he has atrophy of his right hand splint Alert and oriented, grossly normal motor and sensory function;   Skin Warm and Dry  Electrocardiogram dated today  demonstrates sinus rhythm at 71 intervals 19/11/42 axis is leftward at -99 there Q waves in III and F and the V1 and V2 this is consistent with pulmonary disease pattern And is unchanged  Assessment and  Plan  1. Complete heart block. Stable post pacing  2. Pacemaker: St. Jude  The patient's device was interrogated.  The information was reviewed. No changes were made in the programming.     3. Hypertension well-controlled

## 2013-04-13 NOTE — Patient Instructions (Addendum)
Your physician recommends that you continue on your current medications as directed. Please refer to the Current Medication list given to you today.  Your physician wants you to follow-up in: one year with Dr. Graciela Husbands.  You will receive a reminder letter in the mail two months in advance. If you don't receive a letter, please call our office to schedule the follow-up appointment.

## 2013-05-05 ENCOUNTER — Encounter: Payer: Self-pay | Admitting: Internal Medicine

## 2013-10-21 ENCOUNTER — Encounter: Payer: Self-pay | Admitting: *Deleted

## 2013-11-12 ENCOUNTER — Encounter: Payer: Self-pay | Admitting: *Deleted

## 2014-05-26 ENCOUNTER — Ambulatory Visit (INDEPENDENT_AMBULATORY_CARE_PROVIDER_SITE_OTHER): Payer: Medicare Other | Admitting: *Deleted

## 2014-05-26 DIAGNOSIS — I442 Atrioventricular block, complete: Secondary | ICD-10-CM

## 2014-05-26 LAB — MDC_IDC_ENUM_SESS_TYPE_INCLINIC
Battery Remaining Longevity: 132 mo
Battery Voltage: 2.95 V
Brady Statistic RA Percent Paced: 8.9 %
Brady Statistic RV Percent Paced: 36 %
Date Time Interrogation Session: 20151022123038
Implantable Pulse Generator Model: 2110
Implantable Pulse Generator Serial Number: 7132800
Lead Channel Impedance Value: 375 Ohm
Lead Channel Impedance Value: 462.5 Ohm
Lead Channel Pacing Threshold Amplitude: 0.75 V
Lead Channel Pacing Threshold Amplitude: 0.875 V
Lead Channel Pacing Threshold Pulse Width: 0.4 ms
Lead Channel Pacing Threshold Pulse Width: 0.4 ms
Lead Channel Sensing Intrinsic Amplitude: 12 mV
Lead Channel Sensing Intrinsic Amplitude: 3 mV
Lead Channel Setting Pacing Amplitude: 1.125
Lead Channel Setting Pacing Amplitude: 1.75 V
Lead Channel Setting Pacing Pulse Width: 0.4 ms
Lead Channel Setting Sensing Sensitivity: 4 mV

## 2014-05-26 NOTE — Progress Notes (Signed)
Pacemaker check in clinic. Normal device function. Thresholds, sensing, impedances consistent with previous measurements. Device programmed to maximize longevity. 103 mode switches the longest 48 minutes, - coumadin.  No high ventricular rates noted. Device programmed at appropriate safety margins. Histogram distribution appropriate for patient activity level. Device programmed to optimize intrinsic conduction. Estimated longevity 9.9 years.  Plan to follow every 3 months remotely and see annually in office. Patient education completed.  ROV in December with Dr. Caryl Comes.

## 2014-06-22 ENCOUNTER — Encounter: Payer: Self-pay | Admitting: Internal Medicine

## 2014-07-06 ENCOUNTER — Encounter: Payer: Self-pay | Admitting: Internal Medicine

## 2014-07-06 ENCOUNTER — Ambulatory Visit (INDEPENDENT_AMBULATORY_CARE_PROVIDER_SITE_OTHER): Payer: Medicare Other | Admitting: Internal Medicine

## 2014-07-06 VITALS — BP 128/60 | HR 79 | Ht 72.0 in | Wt 184.0 lb

## 2014-07-06 DIAGNOSIS — I442 Atrioventricular block, complete: Secondary | ICD-10-CM

## 2014-07-06 DIAGNOSIS — R0789 Other chest pain: Secondary | ICD-10-CM

## 2014-07-06 DIAGNOSIS — Z45018 Encounter for adjustment and management of other part of cardiac pacemaker: Secondary | ICD-10-CM

## 2014-07-06 DIAGNOSIS — Z950 Presence of cardiac pacemaker: Secondary | ICD-10-CM

## 2014-07-06 LAB — MDC_IDC_ENUM_SESS_TYPE_INCLINIC
Battery Remaining Longevity: 144 mo
Battery Voltage: 2.96 V
Brady Statistic RA Percent Paced: 5.1 %
Brady Statistic RV Percent Paced: 29 %
Date Time Interrogation Session: 20151202164009
Implantable Pulse Generator Model: 2110
Implantable Pulse Generator Serial Number: 7132800
Lead Channel Impedance Value: 412.5 Ohm
Lead Channel Impedance Value: 512.5 Ohm
Lead Channel Pacing Threshold Amplitude: 0.75 V
Lead Channel Pacing Threshold Amplitude: 0.875 V
Lead Channel Pacing Threshold Pulse Width: 0.4 ms
Lead Channel Pacing Threshold Pulse Width: 0.4 ms
Lead Channel Sensing Intrinsic Amplitude: 12 mV
Lead Channel Sensing Intrinsic Amplitude: 2.2 mV
Lead Channel Setting Pacing Amplitude: 1.125
Lead Channel Setting Pacing Amplitude: 2 V
Lead Channel Setting Pacing Pulse Width: 0.4 ms
Lead Channel Setting Sensing Sensitivity: 4 mV

## 2014-07-06 NOTE — Patient Instructions (Addendum)
Your physician recommends that you continue on your current medications as directed. Please refer to the Current Medication list given to you today.  Your physician has requested that you have a lexiscan myoview. For further information please visit [email or web address]. Please follow instruction sheet, as given.  Remote monitoring is used to monitor your Pacemaker of ICD from home. This monitoring reduces the number of office visits required to check your device to one time per year. It allows [place] to keep an eye on the functioning of your device to ensure it is working properly. You are scheduled for a device check from home on 10/05/14. You may send your transmission at any time that day. If you have a wireless device, the transmission will be sent automatically. After your physician reviews your transmission, you will receive a postcard with your next transmission date.  Your physician wants you to follow-up in: 1 year with Dr. Caryl Comes.  You will receive a reminder letter in the mail two months in advance. If you don't receive a letter, please call our office to schedule the follow-up appointment.

## 2014-07-06 NOTE — Progress Notes (Signed)
HPI  Gerald Hardin. is a 80 y.o. male  Seen following pacemaker implantation for complete heart block and syncope in  December 2012. At that time he presented with syncope.   Echocardiogram November 2011 demonstrated normal left ventricular function without wall motion abnormalities. It mildly elevated pulmonary pressures  He also has a history of diabetes, hypertension, hyperlipidemia, COPD, and right axillary squamous cell cancer.   He denies significant chest pain or shortness of breath. He had no peripheral edema.     Past Medical History  Diagnosis Date  . Hypertension   . Hyperlipidemia   . Diabetes mellitus   . COPD (chronic obstructive pulmonary disease)   . CKD (chronic kidney disease), stage II   . CHB (complete heart block) nov 2011  . CA - cancer     Right Axillary Squamous Cell  . Pacemaker     st judes  . DVT of upper extremity (deep vein thrombosis)     post pacer    Past Surgical History  Procedure Laterality Date  . Pacemaker placement  Jul 02 2010  . Squamous cell carcinoma excision      and XRT  . Xrt      Current Outpatient Prescriptions  Medication Sig Dispense Refill  . acetaminophen (TYLENOL) 500 MG tablet Take 500 mg by mouth daily.      Marland Kitchen b complex vitamins tablet Take 1 tablet by mouth daily.      . Coenzyme Q10 (COQ10 PO) Take by mouth every morning.      Marland Kitchen Dextromethorphan-Guaifenesin (MUCINEX DM PO) Take by mouth 2 (two) times daily.      . fenofibrate (TRICOR) 145 MG tablet Take 145 mg by mouth at bedtime.      . Glucosamine-Chondroitin 250-200 MG CAPS Take by mouth. Take two tablets every morning     . Lactobacillus (ACIDOPHILUS) CAPS Take by mouth. Take 1-3 capsules three times a day with meals     . Niacin (NICOTINIC ACID PO) Take 250 mg by mouth 2 (two) times daily.    . pioglitazone (ACTOS) 15 MG tablet Take 15 mg by mouth daily.      . repaglinide (PRANDIN) 2 MG tablet Take 2 mg by mouth 3 (three) times daily before meals.       . simvastatin (ZOCOR) 10 MG tablet Take 1 tablet (10 mg total) by mouth at bedtime. 30 tablet 6  . trandolapril-verapamil (TARKA) 4-240 MG per tablet Take 1/2 tablet daily     No current facility-administered medications for this visit.    No Known Allergies  Review of Systems negative except from HPI and PMH  Physical Exam BP 128/60 mmHg  Pulse 79  Ht 6' (1.829 m)  Wt 184 lb (83.462 kg)  BMI 24.95 kg/m2  Well developed and well nourished in no acute distress HENT normal with poor dentition E scleral and icterus clear Neck Supple JVP flat; carotids brisk and full Clear to ausculation Regular rate and rhythm, no murmurs gallops or rub Soft with active bowel sounds No clubbing cyanosis and edema; he has atrophy of his right hand splint Alert and oriented, grossly normal motor and sensory function;   Skin Warm and Dry  Electrocardiogram dated today demonstrates sinus rhythm at 71 intervals 19/11/42 axis is leftward at -99 there Q waves in III and F and the V1 and V2 this is consistent with pulmonary disease pattern And is unchanged  Assessment and  Plan  1. Complete heart block. Stable post  pacing  2. Pacemaker: St. Jude  The patient's device was interrogated.  The information was reviewed. No changes were made in the programming.     3. Hypertension well-controlled\  Chest pain  While chest pain is atypical, it occurs in the context of multiple cardiac risk factors. We'll undertake a Myoview scan.

## 2014-07-08 ENCOUNTER — Telehealth: Payer: Self-pay | Admitting: *Deleted

## 2014-07-08 NOTE — Telephone Encounter (Signed)
Called pt to get an address to ship a new Merlin to other than his [address]. Pt prefers to pick it up directly here at clinic. Pt knows I will call him back once we have it. Pt prefers Home #, not mobile #.

## 2014-07-12 ENCOUNTER — Telehealth: Payer: Self-pay | Admitting: *Deleted

## 2014-07-12 ENCOUNTER — Encounter (HOSPITAL_COMMUNITY): Payer: Medicare Other

## 2014-07-12 NOTE — Telephone Encounter (Signed)
Let pt know there are two boxes waiting for him at front desk. Pt knows to pick up his Merlin unit along w/ the cell adapter.

## 2014-11-15 ENCOUNTER — Emergency Department (HOSPITAL_COMMUNITY)
Admission: EM | Admit: 2014-11-15 | Discharge: 2014-11-15 | Disposition: A | Discharge status: Home / Self Care | Payer: Medicare Other | Attending: Emergency Medicine | Admitting: Emergency Medicine

## 2014-11-15 ENCOUNTER — Emergency Department (HOSPITAL_COMMUNITY): Payer: Medicare Other

## 2014-11-15 ENCOUNTER — Encounter (HOSPITAL_COMMUNITY): Payer: Self-pay | Admitting: Emergency Medicine

## 2014-11-15 DIAGNOSIS — Z720 Tobacco use: Secondary | ICD-10-CM | POA: Insufficient documentation

## 2014-11-15 DIAGNOSIS — Z950 Presence of cardiac pacemaker: Secondary | ICD-10-CM | POA: Diagnosis not present

## 2014-11-15 DIAGNOSIS — E785 Hyperlipidemia, unspecified: Secondary | ICD-10-CM | POA: Insufficient documentation

## 2014-11-15 DIAGNOSIS — R51 Headache: Secondary | ICD-10-CM | POA: Diagnosis not present

## 2014-11-15 DIAGNOSIS — N182 Chronic kidney disease, stage 2 (mild): Secondary | ICD-10-CM | POA: Insufficient documentation

## 2014-11-15 DIAGNOSIS — I129 Hypertensive chronic kidney disease with stage 1 through stage 4 chronic kidney disease, or unspecified chronic kidney disease: Secondary | ICD-10-CM | POA: Diagnosis not present

## 2014-11-15 DIAGNOSIS — E119 Type 2 diabetes mellitus without complications: Secondary | ICD-10-CM | POA: Diagnosis not present

## 2014-11-15 DIAGNOSIS — J449 Chronic obstructive pulmonary disease, unspecified: Secondary | ICD-10-CM | POA: Diagnosis not present

## 2014-11-15 DIAGNOSIS — Z79899 Other long term (current) drug therapy: Secondary | ICD-10-CM | POA: Insufficient documentation

## 2014-11-15 DIAGNOSIS — Z86718 Personal history of other venous thrombosis and embolism: Secondary | ICD-10-CM | POA: Insufficient documentation

## 2014-11-15 DIAGNOSIS — R519 Headache, unspecified: Secondary | ICD-10-CM

## 2014-11-15 DIAGNOSIS — Z85828 Personal history of other malignant neoplasm of skin: Secondary | ICD-10-CM | POA: Insufficient documentation

## 2014-11-15 DIAGNOSIS — H9209 Otalgia, unspecified ear: Secondary | ICD-10-CM | POA: Diagnosis not present

## 2014-11-15 HISTORY — DX: Unspecified cataract: H26.9

## 2014-11-15 HISTORY — DX: Unspecified macular degeneration: H35.30

## 2014-11-15 LAB — COMPREHENSIVE METABOLIC PANEL
ALT: 17 U/L (ref 0–53)
AST: 26 U/L (ref 0–37)
Albumin: 3.9 g/dL (ref 3.5–5.2)
Alkaline Phosphatase: 48 U/L (ref 39–117)
Anion gap: 9 (ref 5–15)
BUN: 7 mg/dL (ref 6–23)
CO2: 24 mmol/L (ref 19–32)
Calcium: 9.2 mg/dL (ref 8.4–10.5)
Chloride: 104 mmol/L (ref 96–112)
Creatinine, Ser: 1.26 mg/dL (ref 0.50–1.35)
GFR calc Af Amer: 61 mL/min — ABNORMAL LOW (ref >90)
GFR calc non Af Amer: 53 mL/min — ABNORMAL LOW (ref >90)
Glucose, Bld: 136 mg/dL — ABNORMAL HIGH (ref 70–99)
Potassium: 3.7 mmol/L (ref 3.5–5.1)
Sodium: 137 mmol/L (ref 135–145)
Total Bilirubin: 0.8 mg/dL (ref 0.3–1.2)
Total Protein: 6.5 g/dL (ref 6.0–8.3)

## 2014-11-15 LAB — CBC WITH DIFFERENTIAL/PLATELET
Basophils Absolute: 0 10*3/uL (ref 0.0–0.1)
Basophils Relative: 0 % (ref 0–1)
Eosinophils Absolute: 0.1 10*3/uL (ref 0.0–0.7)
Eosinophils Relative: 2 % (ref 0–5)
HCT: 40.7 % (ref 39.0–52.0)
Hemoglobin: 13.8 g/dL (ref 13.0–17.0)
Lymphocytes Relative: 24 % (ref 12–46)
Lymphs Abs: 1.8 10*3/uL (ref 0.7–4.0)
MCH: 30.3 pg (ref 26.0–34.0)
MCHC: 33.9 g/dL (ref 30.0–36.0)
MCV: 89.5 fL (ref 78.0–100.0)
Monocytes Absolute: 1 10*3/uL (ref 0.1–1.0)
Monocytes Relative: 14 % — ABNORMAL HIGH (ref 3–12)
Neutro Abs: 4.5 10*3/uL (ref 1.7–7.7)
Neutrophils Relative %: 60 % (ref 43–77)
Platelets: 170 10*3/uL (ref 150–400)
RBC: 4.55 MIL/uL (ref 4.22–5.81)
RDW: 14.2 % (ref 11.5–15.5)
WBC: 7.5 10*3/uL (ref 4.0–10.5)

## 2014-11-15 MED ORDER — TRAMADOL HCL 50 MG PO TABS
50.0000 mg | ORAL_TABLET | Freq: Four times a day (QID) | ORAL | Status: DC | PRN
Start: 2014-11-15 — End: 2018-07-10

## 2014-11-15 MED ORDER — ACETAMINOPHEN 325 MG PO TABS
650.0000 mg | ORAL_TABLET | Freq: Once | ORAL | Status: AC
  Administered 2014-11-15: 650 mg via ORAL
  Filled 2014-11-15: qty 2

## 2014-11-15 NOTE — ED Notes (Signed)
Headache behind temple and through jaw and into right eye and neck starting last night at 1255 am. PT was not able to sleep last night; took baby aspirin and 500 tylenol. Pt appears anxiious.

## 2014-11-15 NOTE — ED Provider Notes (Signed)
CSN: [id number]     Arrival date & time 11/15/14  0804 History   First MD Initiated Contact with Patient 11/15/14 [phone]     No chief complaint on file.    (Consider location/radiation/quality/duration/timing/severity/associated sxs/prior Treatment) Patient is a 81 y.o. male presenting with headaches. The history is provided by the patient. No language interpreter was used.  Headache Pain location:  R parietal Quality:  Sharp Radiates to:  Does not radiate Severity currently:  5/10 Severity at highest:  3/10 Onset quality:  Gradual Duration:  8 hours Timing:  Constant Progression:  Worsening Chronicity:  New Similar to prior headaches: yes   Context: not coughing   Relieved by:  Nothing Worsened by:  Nothing Ineffective treatments:  None tried Associated symptoms: ear pain     Past Medical History  Diagnosis Date  . Hypertension   . Hyperlipidemia   . Diabetes mellitus   . COPD (chronic obstructive pulmonary disease)   . CKD (chronic kidney disease), stage II   . CHB (complete heart block) nov 2011  . CA - cancer     Right Axillary Squamous Cell  . Pacemaker     st judes  . DVT of upper extremity (deep vein thrombosis)     post pacer   Past Surgical History  Procedure Laterality Date  . Pacemaker placement  Jul 02 2010  . Squamous cell carcinoma excision      and XRT  . Xrt     Family History  Problem Relation Age of Onset  . Heart failure Father    History  Substance Use Topics  . Smoking status: Smoker, Current Status Unknown -- 1.00 packs/day    Types: Cigarettes  . Smokeless tobacco: Not on file  . Alcohol Use: No    Review of Systems  HENT: Positive for ear pain.   Neurological: Positive for headaches.  All other systems reviewed and are negative.     Allergies  Review of patient's allergies indicates no known allergies.  Home Medications   Prior to Admission medications   Medication Sig Start Date End Date Taking? Authorizing Provider   acetaminophen (TYLENOL) 500 MG tablet Take 500 mg by mouth daily.      Historical Provider, MD  b complex vitamins tablet Take 1 tablet by mouth daily.      Historical Provider, MD  Coenzyme Q10 (COQ10 PO) Take by mouth every morning.      Historical Provider, MD  Dextromethorphan-Guaifenesin [facility] DM PO) Take by mouth 2 (two) times daily.      Historical Provider, MD  fenofibrate (TRICOR) 145 MG tablet Take 145 mg by mouth at bedtime.      Historical Provider, MD  Glucosamine-Chondroitin 250-200 MG CAPS Take by mouth. Take two tablets every morning     Historical Provider, MD  Lactobacillus (ACIDOPHILUS) CAPS Take by mouth. Take 1-3 capsules three times a day with meals     Historical Provider, MD  Niacin (NICOTINIC ACID PO) Take 250 mg by mouth 2 (two) times daily.    Historical Provider, MD  pioglitazone (ACTOS) 15 MG tablet Take 15 mg by mouth daily.      Historical Provider, MD  repaglinide (PRANDIN) 2 MG tablet Take 2 mg by mouth 3 (three) times daily before meals.      Historical Provider, MD  simvastatin (ZOCOR) 10 MG tablet Take 1 tablet (10 mg total) by mouth at bedtime. 06/07/11   Deboraha Sprang, MD  trandolapril-verapamil [facility] Fleeting) 4-240 MG per tablet  Take 1/2 tablet daily 12/11/10   Deboraha Sprang, MD   There were no vitals taken for this visit. Physical Exam  Constitutional: He is oriented to person, place, and time. He appears well-developed and well-nourished.  HENT:  Head: Normocephalic and atraumatic.  Right Ear: External ear normal.  Left Ear: External ear normal.  Nose: Nose normal.  Mouth/Throat: Oropharynx is clear and moist.  Eyes: Conjunctivae and EOM are normal. Pupils are equal, round, and reactive to light.  Neck: Normal range of motion.  Cardiovascular: Normal rate.   Pulmonary/Chest: Effort normal and breath sounds normal.  Abdominal: Soft. He exhibits no distension.  Musculoskeletal: Normal range of motion.  Neurological: He is alert and oriented to person,  place, and time. He has normal reflexes. No cranial nerve deficit.  Skin: Skin is warm.  Psychiatric: He has a normal mood and affect.  Nursing note and vitals reviewed. Face in nontender, no rash no droop  ED Course  Procedures (including critical care time) Labs Review Labs Reviewed  CBC WITH DIFFERENTIAL/PLATELET - Abnormal; Notable for the following:    Monocytes Relative 14 (*)    All other components within normal limits  COMPREHENSIVE METABOLIC PANEL - Abnormal; Notable for the following:    Glucose, Bld 136 (*)    GFR calc non Af Amer 53 (*)    GFR calc Af Amer 61 (*)    All other components within normal limits    Imaging Review Ct Head Wo Contrast  11/15/2014   CLINICAL DATA:  Headache since last night.  EXAM: CT HEAD WITHOUT CONTRAST  TECHNIQUE: Contiguous axial images were obtained from the base of the skull through the vertex without intravenous contrast.  COMPARISON:  None.  FINDINGS: Bony calvarium appears intact. Mild diffuse cortical atrophy is noted. Mild chronic ischemic white matter disease is noted. No mass effect or midline shift is noted. Ventricular size is within normal limits. There is no evidence of mass lesion, hemorrhage or acute infarction.  IMPRESSION: Mild diffuse cortical atrophy. Mild chronic ischemic white matter disease. No acute intracranial abnormality seen.   Electronically Signed   By: Marijo Conception, M.D.   On: 11/15/2014 10:06     EKG Interpretation None      MDM ct no acute intracranial abnormality I suspect nerve type pain   Pt advised to watch for rash.   Pt advised to see his MD in 2-3 days   Final diagnoses:  Facial pain   Tramadol rx See your Primary Md for recheck    Fransico Meadow, PA-C 11/15/14 [place], MD 11/15/14 1430

## 2014-11-15 NOTE — ED Notes (Signed)
Pt given copy of CT scans. One disc and 2 copies of paper

## 2014-11-15 NOTE — Discharge Instructions (Signed)
Trigeminal Neuralgia  Trigeminal neuralgia is a nerve disorder that causes sudden attacks of severe facial pain. It is caused by damage to the trigeminal nerve, a major nerve in the face. It is more common in women and in the elderly, although it can also happen in younger patients. Attacks last from a few seconds to several minutes and can occur from a couple of times per year to several times per day. Trigeminal neuralgia can be a very distressing and disabling condition. Surgery may be needed in very severe cases if medical treatment does not give relief.  HOME CARE INSTRUCTIONS    If your caregiver prescribed medication to help prevent attacks, take as directed.   To help prevent attacks:   Chew on the unaffected side of the mouth.   Avoid touching your face.   Avoid blasts of hot or cold air.   Men may wish to grow a beard to avoid having to shave.  SEEK IMMEDIATE MEDICAL CARE IF:   Pain is unbearable and your medicine does not help.   You develop new, unexplained symptoms (problems).   You have problems that may be related to a medication you are taking.  Document Released: 07/19/2000 Document Revised: 10/14/2011 Document Reviewed: 05/19/2009  Exit[employer] Patient Information 2015 [employer]. This information is not intended to replace advice given to you by your health care provider. Make sure you discuss any questions you have with your health care provider.

## 2015-01-19 ENCOUNTER — Ambulatory Visit (INDEPENDENT_AMBULATORY_CARE_PROVIDER_SITE_OTHER): Payer: Medicare Other | Admitting: *Deleted

## 2015-01-19 DIAGNOSIS — I442 Atrioventricular block, complete: Secondary | ICD-10-CM | POA: Diagnosis not present

## 2015-01-19 DIAGNOSIS — Z950 Presence of cardiac pacemaker: Secondary | ICD-10-CM | POA: Diagnosis not present

## 2015-01-19 LAB — CUP PACEART INCLINIC DEVICE CHECK
Battery Remaining Longevity: 136.8 mo
Battery Voltage: 2.95 V
Brady Statistic RA Percent Paced: 7 %
Brady Statistic RV Percent Paced: 0.57 %
Date Time Interrogation Session: 20160616160735
Lead Channel Impedance Value: 412.5 Ohm
Lead Channel Impedance Value: 475 Ohm
Lead Channel Pacing Threshold Amplitude: 0.75 V
Lead Channel Pacing Threshold Amplitude: 0.75 V
Lead Channel Pacing Threshold Amplitude: 0.875 V
Lead Channel Pacing Threshold Pulse Width: 0.4 ms
Lead Channel Pacing Threshold Pulse Width: 0.4 ms
Lead Channel Pacing Threshold Pulse Width: 0.4 ms
Lead Channel Sensing Intrinsic Amplitude: 12 mV
Lead Channel Sensing Intrinsic Amplitude: 2.4 mV
Lead Channel Setting Pacing Amplitude: 1.125
Lead Channel Setting Pacing Amplitude: 2 V
Lead Channel Setting Pacing Pulse Width: 0.4 ms
Lead Channel Setting Sensing Sensitivity: 4 mV
Pulse Gen Model: 2110
Pulse Gen Serial Number: 7132800

## 2015-01-19 NOTE — Progress Notes (Signed)
Pacemaker check in clinic. Normal device function. Thresholds, sensing, impedances consistent with previous measurements. Device programmed to maximize longevity. 60 mode switches--- <1%. No high ventricular rates noted. Device programmed at appropriate safety margins. Histogram distribution appropriate for patient activity level. Device programmed to optimize intrinsic conduction. Estimated longevity 9.7-11.46yrs. ROV w/ SK 12/16.

## 2015-02-13 ENCOUNTER — Encounter: Payer: Self-pay | Admitting: Internal Medicine

## 2015-08-17 ENCOUNTER — Encounter: Payer: Self-pay | Admitting: Internal Medicine

## 2015-08-17 ENCOUNTER — Ambulatory Visit (INDEPENDENT_AMBULATORY_CARE_PROVIDER_SITE_OTHER): Payer: Medicare Other | Admitting: Internal Medicine

## 2015-08-17 VITALS — BP 112/60 | HR 72 | Ht 72.0 in | Wt 178.4 lb

## 2015-08-17 DIAGNOSIS — Z950 Presence of cardiac pacemaker: Secondary | ICD-10-CM

## 2015-08-17 DIAGNOSIS — I442 Atrioventricular block, complete: Secondary | ICD-10-CM

## 2015-08-17 NOTE — Patient Instructions (Signed)
Medication Instructions: - no changes  Labwork: - none  Procedures/Testing: - none  Follow-Up: - Your physician wants you to follow-up in: 6 months with [facility] and 1 year with Dr. Caryl Comes. You will receive a reminder letter in the mail two months in advance. If you don't receive a letter, please call our office to schedule the follow-up appointment.   Any Additional Special Instructions Will Be Listed Below (If Applicable).

## 2015-08-17 NOTE — Progress Notes (Signed)
HPI  Gerald Hardin. is a 82 y.o. male seen following pacemaker implantation for complete heart block and syncope in  December 2012. At that time he presented with syncope.   Echocardiogram November 2011 demonstrated normal left ventricular function without wall motion abnormalities. It mildly elevated pulmonary pressures  He also has a history of diabetes, hypertension, hyperlipidemia, COPD, and right axillary squamous cell cancer.   He denies significant chest pain or shortness of breath. He had no peripheral edema.     Past Medical History  Diagnosis Date  . Hypertension   . Hyperlipidemia   . Diabetes mellitus   . COPD (chronic obstructive pulmonary disease) ([place])   . CKD (chronic kidney disease), stage II   . CHB (complete heart block) ([place]) nov 2011  . CA - cancer     Right Axillary Squamous Cell  . Pacemaker     st judes  . DVT of upper extremity (deep vein thrombosis) ([place])     post pacer  . Macular degeneration   . Cataracts, bilateral     Past Surgical History  Procedure Laterality Date  . Pacemaker placement  Jul 02 2010  . Squamous cell carcinoma excision      and XRT  . Xrt      Current Outpatient Prescriptions  Medication Sig Dispense Refill  . acetaminophen (TYLENOL) 500 MG tablet Take 500 mg by mouth every 6 (six) hours as needed.     Marland Kitchen b complex vitamins tablet Take 1 tablet by mouth daily.      . Coenzyme Q10 (COQ10 PO) Take by mouth every morning.      Marland Kitchen Dextromethorphan-Guaifenesin (MUCINEX DM PO) Take by mouth 2 (two) times daily.      . fenofibrate (TRICOR) 145 MG tablet Take 145 mg by mouth at bedtime.      . Glucosamine-Chondroitin 250-200 MG CAPS Take by mouth. Take two tablets every morning     . Lactobacillus (ACIDOPHILUS) CAPS Take by mouth. Take 1-3 capsules three times a day with meals     . Niacin (NICOTINIC ACID PO) Take 250 mg by mouth 2 (two) times daily.    . pioglitazone (ACTOS) 15 MG tablet Take 15 mg by mouth daily.      .  repaglinide (PRANDIN) 2 MG tablet Take 2 mg by mouth 3 (three) times daily before meals.      . simvastatin (ZOCOR) 10 MG tablet Take 1 tablet (10 mg total) by mouth at bedtime. 30 tablet 6  . traMADol (ULTRAM) 50 MG tablet Take 1 tablet (50 mg total) by mouth every 6 (six) hours as needed. 15 tablet 0  . trandolapril-verapamil (TARKA) 4-240 MG per tablet Take 1/2 tablet daily     No current facility-administered medications for this visit.    No Known Allergies  Review of Systems negative except from HPI and PMH  Physical Exam BP 112/60 mmHg  Pulse 72  Ht 6' (1.829 m)  Wt 178 lb 6.4 oz (80.922 kg)  BMI 24.19 kg/m2  Well developed and well nourished in no acute distress HENT normal with poor dentition E scleral and icterus clear Neck Supple JVP flat; carotids brisk and full Clear to ausculation Regular rate and rhythm, no murmurs gallops or rub Soft with active bowel sounds No clubbing cyanosis and edema; he has atrophy of his right hand splint Alert and oriented, grossly normal motor and sensory function;   Skin Warm and Dry  Electrocardiogram dated today demonstrates sinus rhythm at  71 intervals 19/11/42 axis is leftward at -99 there Q waves in III and F and the V1 and V2 this is consistent with pulmonary disease pattern And is unchanged  Assessment and  Plan  1. Complete heart block. Stable post pacing  2. Pacemaker: St. Jude  The patient's device was interrogated.  The information was reviewed.  Pacemaker  Reprogrammed with a shortened AV delay to address #5:  3. Hypertension well-controlled\  4) Atrial fib  5) RNRVAS    blood pressure is stable. Chest pain is trivial. Device function was notable for  Head and non-reentrant asynchrony. We reprogrammed the AV delay. He has intermittent complete heart block.   Atrial fibrillation was noted with the duration was only 14 minutes. We will MRI on this prior to triggering a decision for anticoagulation.

## 2015-09-02 LAB — CUP PACEART INCLINIC DEVICE CHECK
Brady Statistic RA Percent Paced: 12 %
Brady Statistic RV Percent Paced: 28 %
Date Time Interrogation Session: 20170128120700
Implantable Lead Implant Date: 20111128
Implantable Lead Implant Date: 20111128
Implantable Lead Location: 753859
Implantable Lead Location: 753860
Lead Channel Pacing Threshold Amplitude: 0.75 V
Lead Channel Pacing Threshold Amplitude: 0.75 V
Lead Channel Pacing Threshold Pulse Width: 0.4 ms
Lead Channel Pacing Threshold Pulse Width: 0.4 ms
Lead Channel Sensing Intrinsic Amplitude: 2.2 mV
Lead Channel Setting Pacing Amplitude: 1.125
Lead Channel Setting Pacing Amplitude: 2 V
Lead Channel Setting Pacing Pulse Width: 0.4 ms
Lead Channel Setting Sensing Sensitivity: 4 mV
Pulse Gen Model: 2110
Pulse Gen Serial Number: 7132800

## 2015-12-21 ENCOUNTER — Telehealth: Payer: Self-pay | Admitting: Internal Medicine

## 2015-12-21 NOTE — Telephone Encounter (Signed)
Will forward to Dr. Klein to review. 

## 2015-12-21 NOTE — Telephone Encounter (Signed)
Request for surgical clearance:  1. What type of surgery is being performed? Cataract sx   2. When is this surgery scheduled? 7.25.17  3. Are there any medications that need to be held prior to surgery and how long? No,,Pacemake clearance  4. Name of physician performing surgery? Dr Ander Slade  5. What is your office phone and fax number? Fax [phone] 6.

## 2015-12-21 NOTE — Telephone Encounter (Signed)
No issues with pacemaker for cataract surgery

## 2015-12-25 NOTE — Telephone Encounter (Signed)
Call back from [place]- note needs to state the patient is ok cleared for Cataract Surgery and that there are no issues with the pacemaker.

## 2015-12-25 NOTE — Telephone Encounter (Signed)
I left a message for Gerald Hardin to call.  

## 2016-01-05 ENCOUNTER — Encounter: Payer: Self-pay | Admitting: *Deleted

## 2016-01-08 ENCOUNTER — Telehealth: Payer: Self-pay | Admitting: Internal Medicine

## 2016-01-08 NOTE — Telephone Encounter (Signed)
I called and spoke with Colletta [place]. I advised her I faxed the patient's clearance this morning ~ 10:30 am to [phone] and confirmation was received. She states they did not get the fax. She requested this be faxed again to fax # as above. Re-faxed clearance letter- [phone]. 2nd confirmation notice received.

## 2016-01-08 NOTE — Telephone Encounter (Signed)
Clearance letter faxed to Dr. Ander Slade at [phone]- confirmation received.

## 2016-01-08 NOTE — Telephone Encounter (Signed)
New message   Colletta Maryland is calling for approval of surgical clearance   Request for surgical clearance:  What type of surgery is being performed? left eye cataract surgery 1. When is this surgery scheduled? July 25,2017   2. Are there any medications that need to be held prior to surgery and how long? Unknown  Dr. Ander Slade regarding his pacemaker  3. Name of physician performing surgery? Dr. Ander Slade  4. What is your office phone and fax number?  [phone]

## 2016-01-17 ENCOUNTER — Encounter: Payer: Self-pay | Admitting: Internal Medicine

## 2016-01-17 ENCOUNTER — Ambulatory Visit (INDEPENDENT_AMBULATORY_CARE_PROVIDER_SITE_OTHER): Payer: Medicare Other | Admitting: *Deleted

## 2016-01-17 DIAGNOSIS — I442 Atrioventricular block, complete: Secondary | ICD-10-CM

## 2016-01-17 LAB — CUP PACEART INCLINIC DEVICE CHECK
Battery Remaining Longevity: 116.4
Battery Voltage: 2.93 V
Brady Statistic RA Percent Paced: 11 %
Brady Statistic RV Percent Paced: 84 %
Date Time Interrogation Session: 20170614123001
Implantable Lead Implant Date: 20111128
Implantable Lead Implant Date: 20111128
Implantable Lead Location: 753859
Implantable Lead Location: 753860
Lead Channel Impedance Value: 375 Ohm
Lead Channel Impedance Value: 512.5 Ohm
Lead Channel Pacing Threshold Amplitude: 0.75 V
Lead Channel Pacing Threshold Amplitude: 0.75 V
Lead Channel Pacing Threshold Amplitude: 1 V
Lead Channel Pacing Threshold Amplitude: 1 V
Lead Channel Pacing Threshold Pulse Width: 0.4 ms
Lead Channel Pacing Threshold Pulse Width: 0.4 ms
Lead Channel Pacing Threshold Pulse Width: 0.4 ms
Lead Channel Pacing Threshold Pulse Width: 0.4 ms
Lead Channel Sensing Intrinsic Amplitude: 3.2 mV
Lead Channel Setting Pacing Amplitude: 1 V
Lead Channel Setting Pacing Amplitude: 2 V
Lead Channel Setting Pacing Pulse Width: 0.4 ms
Lead Channel Setting Sensing Sensitivity: 4 mV
Pulse Gen Model: 2110
Pulse Gen Serial Number: 7132800

## 2016-01-17 NOTE — Progress Notes (Signed)
Pacemaker check in clinic. Normal device function. Thresholds, sensing, impedances consistent with previous measurements. Device programmed to maximize longevity. 71 AMS, 2 EGMs indicate AF longest 1hr 6min. Peak A 640. No OAC, (AT/ABF burden 1%). No high ventricular rates noted. Device programmed at appropriate safety margins. Histogram distribution appropriate for patient activity level. Device programmed to optimize intrinsic conduction. Estimated longevity 8.8 years. PT declines remote monitoring. pt education completed. ROV with SK in 70months.

## 2016-08-29 ENCOUNTER — Encounter (INDEPENDENT_AMBULATORY_CARE_PROVIDER_SITE_OTHER): Payer: Self-pay

## 2016-08-29 ENCOUNTER — Encounter: Payer: Self-pay | Admitting: Internal Medicine

## 2016-08-29 ENCOUNTER — Ambulatory Visit (INDEPENDENT_AMBULATORY_CARE_PROVIDER_SITE_OTHER): Payer: Medicare Other | Admitting: Internal Medicine

## 2016-08-29 VITALS — BP 132/62 | HR 76 | Ht 72.0 in | Wt 186.6 lb

## 2016-08-29 DIAGNOSIS — Z950 Presence of cardiac pacemaker: Secondary | ICD-10-CM | POA: Diagnosis not present

## 2016-08-29 DIAGNOSIS — I4891 Unspecified atrial fibrillation: Secondary | ICD-10-CM

## 2016-08-29 DIAGNOSIS — I442 Atrioventricular block, complete: Secondary | ICD-10-CM

## 2016-08-29 LAB — CUP PACEART INCLINIC DEVICE CHECK
Battery Voltage: 2.93 V
Brady Statistic RA Percent Paced: 12 %
Brady Statistic RV Percent Paced: 41 %
Date Time Interrogation Session: 20180125172350
Implantable Lead Implant Date: 20111128
Implantable Lead Implant Date: 20111128
Implantable Lead Location: 753859
Implantable Lead Location: 753860
Implantable Pulse Generator Implant Date: 20111128
Lead Channel Impedance Value: 400 Ohm
Lead Channel Impedance Value: 512.5 Ohm
Lead Channel Pacing Threshold Amplitude: 0.75 V
Lead Channel Pacing Threshold Amplitude: 0.75 V
Lead Channel Pacing Threshold Amplitude: 0.75 V
Lead Channel Pacing Threshold Amplitude: 0.75 V
Lead Channel Pacing Threshold Pulse Width: 0.4 ms
Lead Channel Pacing Threshold Pulse Width: 0.4 ms
Lead Channel Pacing Threshold Pulse Width: 0.4 ms
Lead Channel Pacing Threshold Pulse Width: 0.4 ms
Lead Channel Sensing Intrinsic Amplitude: 12 mV
Lead Channel Sensing Intrinsic Amplitude: 3 mV
Lead Channel Setting Pacing Amplitude: 1.125
Lead Channel Setting Pacing Amplitude: 2 V
Lead Channel Setting Pacing Pulse Width: 0.4 ms
Lead Channel Setting Sensing Sensitivity: 4 mV
Pulse Gen Model: 2110
Pulse Gen Serial Number: 7132800

## 2016-08-29 NOTE — Progress Notes (Signed)
HPI  Gerald Smet. is a 83 y.o. male seen following pacemaker implantation for complete heart block and syncope in  December 2012. At that time he presented with syncope.   Echocardiogram November 2011 demonstrated normal left ventricular function without wall motion abnormalities. It mildly elevated pulmonary pressures  He also has a history of diabetes, hypertension, hyperlipidemia, COPD, and right axillary squamous cell cancer.   He denies significant chest pain or shortness of breath. He had no peripheral edema.     Past Medical History:  Diagnosis Date  . 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 Cataracts, bilateral   . CHB (complete heart block) ([place]) nov 2011  . CKD (chronic kidney disease), stage II   . COPD (chronic obstructive pulmonary disease) ([place])   . Diabetes mellitus   . DVT of upper extremity (deep vein thrombosis) ([place])    post pacer  . Hyperlipidemia   . Hypertension   . Macular degeneration   . Pacemaker    st judes    Past Surgical History:  Procedure Laterality Date  . PACEMAKER PLACEMENT  Jul 02 2010  . SQUAMOUS CELL CARCINOMA EXCISION     and XRT  . xrt      Current Outpatient Prescriptions  Medication Sig Dispense Refill  . acetaminophen (TYLENOL) 500 MG tablet Take 500 mg by mouth every 6 (six) hours as needed.     Marland Kitchen b complex vitamins tablet Take 1 tablet by mouth daily.      . Coenzyme Q10 (COQ10 PO) Take by mouth every morning.      Marland Kitchen Dextromethorphan-Guaifenesin (MUCINEX DM PO) Take by mouth 2 (two) times daily.      . fenofibrate (TRICOR) 145 MG tablet Take 145 mg by mouth at bedtime.      . Glucosamine-Chondroitin 250-200 MG CAPS Take by mouth. Take two tablets every morning     . Lactobacillus (ACIDOPHILUS) CAPS Take by mouth. Take 1-3 capsules three times a day with meals     . Niacin (NICOTINIC ACID PO) Take 250 mg by mouth 2 (two) times daily.    . pioglitazone (ACTOS) 15 MG tablet Take 15 mg by mouth daily.      .  repaglinide (PRANDIN) 2 MG tablet Take 2 mg by mouth 3 (three) times daily before meals.      . simvastatin (ZOCOR) 10 MG tablet Take 1 tablet (10 mg total) by mouth at bedtime. 30 tablet 6  . traMADol (ULTRAM) 50 MG tablet Take 1 tablet (50 mg total) by mouth every 6 (six) hours as needed. 15 tablet 0  . trandolapril-verapamil (TARKA) 4-240 MG per tablet Take 1/2 tablet daily     No current facility-administered medications for this visit.     No Known Allergies  Review of Systems negative except from HPI and PMH  Physical Exam BP 132/62   Pulse 76   Ht 6' (1.829 m)   Wt 186 lb 9.6 oz (84.6 kg)   SpO2 98%   BMI 25.31 kg/m   Well developed and well nourished in no acute distress HENT normal with poor dentition E scleral and icterus clear Neck Supple JVP flat; carotids brisk and full Clear to ausculation Regular rate and rhythm, no murmurs gallops or rub Soft with active bowel sounds No clubbing cyanosis and edema; he has atrophy of his right hand splint Alert and oriented, grossly normal motor and sensory function;   Skin Warm and Dry  Electrocardiogram dated  today demonstrates sinus rhythm at 71 intervals 19/11/42 axis is leftward at -99 there Q waves in III and F and the V1 and V2 this is consistent with pulmonary disease pattern And is unchanged  Assessment and  Plan  1. Complete heart block. Stable post pacing  2. Pacemaker: St. Jude  The patient's device was interrogated.  The information was reviewed.  Pacemaker  Reprogrammed with a shortened AV delay to address #5:  3. Hypertension well-controlled\  4) Atrial fib  5) RNRVAS    blood pressure is stable. Chest pain is trivial. Device function was notable for  Head and non-reentrant asynchrony. We reprogrammed the AV delay. He has intermittent complete heart block.   Atrial fibrillation was noted with the duration was only 14 minutes. We will MRI on this prior to triggering a decision for anticoagulation.

## 2016-08-29 NOTE — Patient Instructions (Addendum)
Medication Instructions: - Your physician recommends that you continue on your current medications as directed. Please refer to the Current Medication list given to you today.  Labwork: - none ordered  Procedures/Testing: - none ordered  Follow-Up: - Your physician recommends that you schedule a follow-up appointment in: May w/ the [facility]   Any Additional Special Instructions Will Be Listed Below (If Applicable).     If you need a refill on your cardiac medications before your next appointment, please call your pharmacy.

## 2016-12-18 ENCOUNTER — Ambulatory Visit (INDEPENDENT_AMBULATORY_CARE_PROVIDER_SITE_OTHER): Payer: Medicare Other | Admitting: *Deleted

## 2016-12-18 DIAGNOSIS — I442 Atrioventricular block, complete: Secondary | ICD-10-CM | POA: Diagnosis not present

## 2016-12-18 DIAGNOSIS — I4891 Unspecified atrial fibrillation: Secondary | ICD-10-CM

## 2016-12-18 DIAGNOSIS — Z950 Presence of cardiac pacemaker: Secondary | ICD-10-CM

## 2016-12-18 LAB — CUP PACEART INCLINIC DEVICE CHECK
Battery Voltage: 2.9 V
Brady Statistic RA Percent Paced: 12 %
Brady Statistic RV Percent Paced: 1.5 %
Date Time Interrogation Session: 20180516145742
Implantable Lead Implant Date: 20111128
Implantable Lead Implant Date: 20111128
Implantable Lead Location: 753859
Implantable Lead Location: 753860
Implantable Pulse Generator Implant Date: 20111128
Lead Channel Impedance Value: 362.5 Ohm
Lead Channel Impedance Value: 450 Ohm
Lead Channel Pacing Threshold Amplitude: 0.75 V
Lead Channel Pacing Threshold Amplitude: 1 V
Lead Channel Pacing Threshold Pulse Width: 0.4 ms
Lead Channel Pacing Threshold Pulse Width: 0.4 ms
Lead Channel Sensing Intrinsic Amplitude: 12 mV
Lead Channel Sensing Intrinsic Amplitude: 2.9 mV
Lead Channel Setting Pacing Amplitude: 1.125
Lead Channel Setting Pacing Amplitude: 2 V
Lead Channel Setting Pacing Pulse Width: 0.4 ms
Lead Channel Setting Sensing Sensitivity: 4 mV
Pulse Gen Model: 2110
Pulse Gen Serial Number: 7132800

## 2016-12-18 NOTE — Progress Notes (Signed)
Pacemaker check in clinic. Normal device function. Thresholds, sensing, impedances consistent with previous measurements. Device programmed to maximize longevity. 70 mode switches (<1%)--AF, longest 2.5hrs, no OAC per SK unless burden increases. No high ventricular rates noted. 13502 consecutive PVC episodes--conducted PACs and PVC couplets/brief NSVT per EGMs. Device programmed at appropriate safety margins. Histogram distribution appropriate for patient activity level. Device programmed to optimize intrinsic conduction. Estimated longevity 6.8-8.0 years. Patient declines remote monitoring. Patient education completed. ROV with SK in 06/2017 per pt preference (unable to follow May/Jan schedule as he spends winters in [facility]).

## 2016-12-18 NOTE — Patient Instructions (Signed)
Your physician wants you to follow-up in: 6 months with Dr. Klein. You will receive a reminder letter in the mail two months in advance. If you don't receive a letter, please call our office to schedule the follow-up appointment.  

## 2017-07-08 ENCOUNTER — Ambulatory Visit (INDEPENDENT_AMBULATORY_CARE_PROVIDER_SITE_OTHER): Payer: Medicare Other | Admitting: Internal Medicine

## 2017-07-08 ENCOUNTER — Encounter: Payer: Self-pay | Admitting: Internal Medicine

## 2017-07-08 ENCOUNTER — Encounter (INDEPENDENT_AMBULATORY_CARE_PROVIDER_SITE_OTHER): Payer: Self-pay

## 2017-07-08 VITALS — BP 104/58 | HR 79 | Ht 72.0 in | Wt 185.2 lb

## 2017-07-08 DIAGNOSIS — I4891 Unspecified atrial fibrillation: Secondary | ICD-10-CM

## 2017-07-08 DIAGNOSIS — Z950 Presence of cardiac pacemaker: Secondary | ICD-10-CM

## 2017-07-08 DIAGNOSIS — I442 Atrioventricular block, complete: Secondary | ICD-10-CM

## 2017-07-08 NOTE — Progress Notes (Signed)
HPI  Gerald Hardin. is a 83 y.o. male seen following pacemaker implantation for complete heart block and syncope in  December 2012. At that time he presented with syncope.   Echocardiogram November 2011 demonstrated normal left ventricular function without wall motion abnormalities. It mildly elevated pulmonary pressures  He also has a history of diabetes, hypertension, hyperlipidemia, COPD, and right axillary squamous cell cancer.   He has chronic sob and cough  But no chest pain   Continues smoke    Past Medical History:  Diagnosis Date  . 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 Cataracts, bilateral   . CHB (complete heart block) ([place]) nov 2011  . CKD (chronic kidney disease), stage II   . COPD (chronic obstructive pulmonary disease) ([place])   . Diabetes mellitus   . DVT of upper extremity (deep vein thrombosis) ([place])    post pacer  . Hyperlipidemia   . Hypertension   . Macular degeneration   . Pacemaker    st judes    Past Surgical History:  Procedure Laterality Date  . PACEMAKER PLACEMENT  Jul 02 2010  . SQUAMOUS CELL CARCINOMA EXCISION     and XRT  . xrt      Current Outpatient Medications  Medication Sig Dispense Refill  . acetaminophen (TYLENOL) 500 MG tablet Take 500 mg by mouth every 6 (six) hours as needed.     Marland Kitchen b complex vitamins tablet Take 1 tablet by mouth daily.      . Coenzyme Q10 (COQ10 PO) Take by mouth every morning.      Marland Kitchen Dextromethorphan-Guaifenesin (MUCINEX DM PO) Take by mouth 2 (two) times daily.      . fenofibrate (TRICOR) 145 MG tablet Take 145 mg by mouth at bedtime.      . Glucosamine-Chondroitin 250-200 MG CAPS Take by mouth. Take two tablets every morning     . Lactobacillus (ACIDOPHILUS) CAPS Take by mouth. Take 1-3 capsules three times a day with meals     . Niacin (NICOTINIC ACID PO) Take 250 mg by mouth 2 (two) times daily.    . pioglitazone (ACTOS) 15 MG tablet Take 15 mg by mouth daily.      . repaglinide (PRANDIN) 2 MG  tablet Take 2 mg by mouth 3 (three) times daily before meals.      . simvastatin (ZOCOR) 10 MG tablet Take 1 tablet (10 mg total) by mouth at bedtime. 30 tablet 6  . Specialty Vitamins Products (ICAPS LUTEIN-ZEAXANTHIN PO) Take 1 capsule by mouth 2 (two) times daily.    . traMADol (ULTRAM) 50 MG tablet Take 1 tablet (50 mg total) by mouth every 6 (six) hours as needed. 15 tablet 0  . trandolapril-verapamil (TARKA) 4-240 MG per tablet Take 1/2 tablet daily     No current facility-administered medications for this visit.     No Known Allergies  Review of Systems negative except from HPI and PMH  Physical Exam BP (!) 104/58   Pulse 79   Ht 6' (1.829 m)   Wt 185 lb 3.2 oz (84 kg)   BMI 25.12 kg/m  Well developed and nourished in mld rsp distress HENT normal Neck supple with JVP-flat Clear Device pocket well healed; without hematoma or erythema.  There is no tethering  Regular rate and rhythm, no murmurs or gallops Abd-soft with active BS No Clubbing cyanosis edema Skin-warm and dry A & Oriented  Grossly normal sensory and motor function  ECG 79 24/11/38 RBBB  Assessment and  Plan  1. Complete heart block- intermittent .   2. Pacemaker: St. Jude  The patient's device was interrogated.  The information was reviewed.  Pacemaker  Reprogrammed with a shortened AV delay to address #5:  3. Hypertension well-controlled\  4) Atrial fib-paroxysmal    Without symptoms of ischemia  SOB stable

## 2017-07-08 NOTE — Patient Instructions (Addendum)
Medication Instructions: Your physician recommends that you continue on your current medications as directed. Please refer to the Current Medication list given to you today.  Labwork: None Ordered  Procedures/Testing: None Ordered  Follow-Up: Your physician wants you to follow-up in: 1 YEAR with Dr. Caryl Comes. You will receive a reminder letter in the mail two months in advance. If you don't receive a letter, please call our office to schedule the follow-up appointment.  Your physician recommends that you schedule a follow-up appointment in: 6 MONTHS with the [facility]   If you need a refill on your cardiac medications before your next appointment, please call your pharmacy.

## 2017-07-09 LAB — CUP PACEART INCLINIC DEVICE CHECK
Date Time Interrogation Session: 20181205083058
Implantable Lead Implant Date: 20111128
Implantable Lead Implant Date: 20111128
Implantable Lead Location: 753859
Implantable Lead Location: 753860
Implantable Pulse Generator Implant Date: 20111128
Pulse Gen Model: 2110
Pulse Gen Serial Number: 7132800

## 2017-07-22 NOTE — Addendum Note (Signed)
Addended by: Campbell Riches on: 07/22/2017 09:27 AM   Modules accepted: Orders

## 2017-12-17 ENCOUNTER — Ambulatory Visit (INDEPENDENT_AMBULATORY_CARE_PROVIDER_SITE_OTHER): Payer: Medicare Other | Admitting: *Deleted

## 2017-12-17 ENCOUNTER — Encounter (INDEPENDENT_AMBULATORY_CARE_PROVIDER_SITE_OTHER): Payer: Self-pay

## 2017-12-17 DIAGNOSIS — I442 Atrioventricular block, complete: Secondary | ICD-10-CM

## 2017-12-18 LAB — CUP PACEART INCLINIC DEVICE CHECK
Battery Remaining Longevity: 80 mo
Battery Voltage: 2.89 V
Brady Statistic RA Percent Paced: 17 %
Brady Statistic RV Percent Paced: 46 %
Date Time Interrogation Session: 20190515152207
Implantable Lead Implant Date: 20111128
Implantable Lead Implant Date: 20111128
Implantable Lead Location: 753859
Implantable Lead Location: 753860
Implantable Pulse Generator Implant Date: 20111128
Lead Channel Impedance Value: 387.5 Ohm
Lead Channel Impedance Value: 450 Ohm
Lead Channel Pacing Threshold Amplitude: 1 V
Lead Channel Pacing Threshold Amplitude: 1 V
Lead Channel Pacing Threshold Amplitude: 1.25 V
Lead Channel Pacing Threshold Amplitude: 1.25 V
Lead Channel Pacing Threshold Pulse Width: 0.4 ms
Lead Channel Pacing Threshold Pulse Width: 0.4 ms
Lead Channel Pacing Threshold Pulse Width: 0.4 ms
Lead Channel Pacing Threshold Pulse Width: 0.4 ms
Lead Channel Sensing Intrinsic Amplitude: 1.6 mV
Lead Channel Sensing Intrinsic Amplitude: 12 mV
Lead Channel Setting Pacing Amplitude: 1.375
Lead Channel Setting Pacing Amplitude: 1.875
Lead Channel Setting Pacing Pulse Width: 0.4 ms
Lead Channel Setting Sensing Sensitivity: 2.5 mV
Pulse Gen Model: 2110
Pulse Gen Serial Number: 7132800

## 2017-12-18 NOTE — Progress Notes (Signed)
Pacemaker check in clinic. Normal device function. Thresholds, sensing, impedances consistent with previous measurements. Device programmed to maximize longevity. 76 AMS episodes (<1%) mode switch, longest x ~2hrs. No high ventricular rates noted. Device programmed at appropriate safety margins. Histogram distribution appropriate for patient activity level. Device programmed to optimize intrinsic conduction. Estimated longevity 6-6.48yrs. ROV with SK in 57mo.

## 2018-06-11 ENCOUNTER — Telehealth: Payer: Self-pay | Admitting: Internal Medicine

## 2018-06-11 NOTE — Telephone Encounter (Signed)
  Patient states that he comes to [place] one time a year to see his doctors here. He saw Dr. Caryl Comes on 07/08/17 and stated that he was supposed to have an appt with Dr Caryl Comes on 07/07/18 @ 2 pm. We had him scheduled for 06/16/18 @ 2:15 and he received a reminder call and called to let us know that he is in [place] until Dec and that he understood that his appt was to be set up for 07/07/18. I offered him an appt with Tommye Standard on 07/08/18 but he is very unhappy that he cannot see Dr. Caryl Comes while he is here. He states that it is our error that his appt wasn't scheduled right and would like an appt with Dr. Caryl Comes instead. I left him scheduled with Renee but told him I would send the message to see if there was any way to work him in with Dr. Caryl Comes during the first of Dec. Please advise.

## 2018-06-11 NOTE — Telephone Encounter (Signed)
Will route to EP scheduler to see about getting pt in with Dr. Caryl Comes if possible.

## 2018-06-12 NOTE — Telephone Encounter (Signed)
The patient has been scheduled 12/6 with Dr. Caryl Comes.

## 2018-06-16 ENCOUNTER — Encounter: Payer: Medicare Other | Admitting: Internal Medicine

## 2018-07-07 ENCOUNTER — Encounter: Payer: Medicare Other | Admitting: Internal Medicine

## 2018-07-08 ENCOUNTER — Ambulatory Visit: Payer: Medicare Other | Admitting: Physician Assistant

## 2018-07-10 ENCOUNTER — Encounter (INDEPENDENT_AMBULATORY_CARE_PROVIDER_SITE_OTHER): Payer: Self-pay

## 2018-07-10 ENCOUNTER — Encounter: Payer: Self-pay | Admitting: Internal Medicine

## 2018-07-10 ENCOUNTER — Ambulatory Visit (INDEPENDENT_AMBULATORY_CARE_PROVIDER_SITE_OTHER): Payer: Medicare Other | Admitting: Internal Medicine

## 2018-07-10 VITALS — BP 128/66 | HR 69 | Ht 72.0 in | Wt 171.6 lb

## 2018-07-10 DIAGNOSIS — I10 Essential (primary) hypertension: Secondary | ICD-10-CM | POA: Diagnosis not present

## 2018-07-10 DIAGNOSIS — I4891 Unspecified atrial fibrillation: Secondary | ICD-10-CM | POA: Diagnosis not present

## 2018-07-10 DIAGNOSIS — I442 Atrioventricular block, complete: Secondary | ICD-10-CM

## 2018-07-10 DIAGNOSIS — Z950 Presence of cardiac pacemaker: Secondary | ICD-10-CM

## 2018-07-10 LAB — CUP PACEART INCLINIC DEVICE CHECK
Battery Remaining Longevity: 66 mo
Battery Voltage: 2.86 V
Brady Statistic RA Percent Paced: 19 %
Brady Statistic RV Percent Paced: 34 %
Date Time Interrogation Session: 20191206144014
Implantable Lead Implant Date: 20111128
Implantable Lead Implant Date: 20111128
Implantable Lead Location: 753859
Implantable Lead Location: 753860
Implantable Pulse Generator Implant Date: 20111128
Lead Channel Impedance Value: 437.5 Ohm
Lead Channel Impedance Value: 450 Ohm
Lead Channel Pacing Threshold Amplitude: 1 V
Lead Channel Pacing Threshold Amplitude: 1.25 V
Lead Channel Pacing Threshold Pulse Width: 0.4 ms
Lead Channel Pacing Threshold Pulse Width: 0.4 ms
Lead Channel Sensing Intrinsic Amplitude: 1.9 mV
Lead Channel Sensing Intrinsic Amplitude: 7.2 mV
Lead Channel Setting Pacing Amplitude: 1.625
Lead Channel Setting Pacing Amplitude: 1.875
Lead Channel Setting Pacing Pulse Width: 0.4 ms
Lead Channel Setting Sensing Sensitivity: 2.5 mV
Pulse Gen Model: 2110
Pulse Gen Serial Number: 7132800

## 2018-07-10 NOTE — Patient Instructions (Signed)
Medication Instructions:  Your physician recommends that you continue on your current medications as directed. Please refer to the Current Medication list given to you today.  Labwork: None ordered.  Testing/Procedures: None ordered.  Follow-Up: Your physician recommends that you schedule a follow-up appointment in:   6 months with our device clinic  12 months with Dr Caryl Comes  Any Other Special Instructions Will Be Listed Below (If Applicable).     If you need a refill on your cardiac medications before your next appointment, please call your pharmacy.

## 2018-07-10 NOTE — Progress Notes (Signed)
HPI  Gerald Hardin. is a 84 y.o. male seen following pacemaker implantation for complete heart block and syncope in  December 2012. At that time he presented with syncope.   Echocardiogram November 2011 demonstrated normal left ventricular function without wall motion abnormalities. It mildly elevated pulmonary pressures  He also has a history of diabetes, hypertension, hyperlipidemia, COPD, and right axillary squamous cell cancer.   Chronic SOB, no chest pain edema   No bleeding   Continues smoke    Past Medical History:  Diagnosis Date  . 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 Cataracts, bilateral   . CHB (complete heart block) ([place]) nov 2011  . CKD (chronic kidney disease), stage II   . COPD (chronic obstructive pulmonary disease) ([place])   . Diabetes mellitus   . DVT of upper extremity (deep vein thrombosis) ([place])    post pacer  . Hyperlipidemia   . Hypertension   . Macular degeneration   . Pacemaker    st judes    Past Surgical History:  Procedure Laterality Date  . PACEMAKER PLACEMENT  Jul 02 2010  . SQUAMOUS CELL CARCINOMA EXCISION     and XRT  . xrt      Current Outpatient Medications  Medication Sig Dispense Refill  . acetaminophen (TYLENOL) 500 MG tablet Take 500 mg by mouth every 6 (six) hours as needed.     Marland Kitchen b complex vitamins tablet Take 1 tablet by mouth daily.      . Coenzyme Q10 (COQ10 PO) Take by mouth every morning.      Marland Kitchen Dextromethorphan-Guaifenesin (MUCINEX DM PO) Take by mouth 2 (two) times daily.      . fenofibrate (TRICOR) 145 MG tablet Take 145 mg by mouth at bedtime.      . Glucosamine-Chondroitin 250-200 MG CAPS Take by mouth. Take two tablets every morning     . Lactobacillus (ACIDOPHILUS) CAPS Take by mouth. Take 1-3 capsules three times a day with meals     . Niacin (NICOTINIC ACID PO) Take 250 mg by mouth 2 (two) times daily.    . repaglinide (PRANDIN) 2 MG tablet Take 2 mg by mouth 3 (three) times daily before meals.       . simvastatin (ZOCOR) 10 MG tablet Take 1 tablet (10 mg total) by mouth at bedtime. 30 tablet 6  . Specialty Vitamins Products (ICAPS LUTEIN-ZEAXANTHIN PO) Take 1 capsule by mouth 2 (two) times daily.    . trandolapril-verapamil (TARKA) 4-240 MG per tablet Take 1/2 tablet daily     No current facility-administered medications for this visit.     No Known Allergies  Review of Systems negative except from HPI and PMH  Physical Exam BP 128/66   Pulse 69   Ht 6' (1.829 m)   Wt 171 lb 9.6 oz (77.8 kg)   SpO2 96%   BMI 23.27 kg/m  Well developed and nourished in no acute distress HENT normal Neck supple  Clear Regular rate and rhythm, no murmurs or gallops Abd-soft with active BS No Clubbing cyanosis edema Skin-warm and dry A & Oriented  Grossly normal sensory and motor function  ECG P-synchronous/ AV  pacing    Assessment and  Plan  1. Complete heart block- intermittent .   2. Pacemaker: St. Jude  The patient's device was interrogated.  The information was reviewed. No changes were made in the programming.     3. Hypertension well-controlled\  4) Atrial fib-paroxysmal  Continue current meds  No bleeding   No intercurrent atrial fibrillation or flutter

## 2018-11-18 ENCOUNTER — Telehealth: Payer: Self-pay | Admitting: Nurse Practitioner

## 2018-11-18 NOTE — Telephone Encounter (Signed)
Tried both numbers for patient - called to cancel appt for Device clinic and let patient know to see  Dr Caryl Comes in December

## 2019-01-19 ENCOUNTER — Ambulatory Visit: Payer: Medicare Other | Admitting: Physical Therapy

## 2019-07-13 ENCOUNTER — Encounter: Payer: Self-pay | Admitting: Internal Medicine

## 2019-07-13 ENCOUNTER — Other Ambulatory Visit: Payer: Self-pay

## 2019-07-13 ENCOUNTER — Ambulatory Visit (INDEPENDENT_AMBULATORY_CARE_PROVIDER_SITE_OTHER): Payer: Medicare Other | Admitting: Internal Medicine

## 2019-07-13 VITALS — BP 126/70 | HR 72 | Ht 72.0 in | Wt 177.6 lb

## 2019-07-13 DIAGNOSIS — Z950 Presence of cardiac pacemaker: Secondary | ICD-10-CM

## 2019-07-13 DIAGNOSIS — I442 Atrioventricular block, complete: Secondary | ICD-10-CM | POA: Diagnosis not present

## 2019-07-13 DIAGNOSIS — I4891 Unspecified atrial fibrillation: Secondary | ICD-10-CM

## 2019-07-13 NOTE — Patient Instructions (Signed)
Medication Instructions:  Your physician recommends that you continue on your current medications as directed. Please refer to the Current Medication list given to you today.    Labwork: None ordered.   Testing/Procedures: None ordered.   Follow-Up: Your physician recommends that you schedule a follow-up appointment in: 1 year   Any Other Special Instructions Will Be Listed Below (If Applicable).     If you need a refill on your cardiac medications before your next appointment, please call your pharmacy. \

## 2019-07-13 NOTE — Progress Notes (Signed)
HPI  Gerald Hardin. is a 85 y.o. male seen following pacemaker implantation for complete heart block and syncope in  December 2012. At that time he presented with syncope.   Echocardiogram November 2011 demonstrated normal left ventricular function without wall motion abnormalities. It mildly elevated pulmonary pressures  Chronically SOB   Infrequent fleeting L sided chest pains  No edema  No palpitations    Past Medical History:  Diagnosis Date  . 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 Cataracts, bilateral   . CHB (complete heart block) ([place]) nov 2011  . CKD (chronic kidney disease), stage II   . COPD (chronic obstructive pulmonary disease) ([place])   . Diabetes mellitus   . DVT of upper extremity (deep vein thrombosis) ([place])    post pacer  . Hyperlipidemia   . Hypertension   . Macular degeneration   . Pacemaker    st judes    Past Surgical History:  Procedure Laterality Date  . PACEMAKER PLACEMENT  Jul 02 2010  . SQUAMOUS CELL CARCINOMA EXCISION     and XRT  . xrt      Current Outpatient Medications  Medication Sig Dispense Refill  . acetaminophen (TYLENOL) 500 MG tablet Take 500 mg by mouth every 6 (six) hours as needed.     Marland Kitchen b complex vitamins tablet Take 1 tablet by mouth daily.      . Coenzyme Q10 (COQ10 PO) Take by mouth every morning.      Marland Kitchen Dextromethorphan-Guaifenesin (MUCINEX DM PO) Take by mouth 2 (two) times daily.      . fenofibrate (TRICOR) 145 MG tablet Take 145 mg by mouth at bedtime.      . Glucosamine-Chondroitin 250-200 MG CAPS Take by mouth. Take two tablets every morning     . Lactobacillus (ACIDOPHILUS) CAPS Take by mouth. Take 1-3 capsules three times a day with meals     . Misc Natural Products (LUTEIN 20 PO) Take 20 mg by mouth daily.    . Niacin (NICOTINIC ACID PO) Take 250 mg by mouth 2 (two) times daily.    . repaglinide (PRANDIN) 2 MG tablet Take 2 mg by mouth 3 (three) times daily before meals.      . simvastatin (ZOCOR)  10 MG tablet Take 1 tablet (10 mg total) by mouth at bedtime. 30 tablet 6  . SODIUM BICARBONATE PO Take 162-165 mg by mouth daily.    Marland Kitchen Specialty Vitamins Products (ICAPS LUTEIN-ZEAXANTHIN PO) Take 1 capsule by mouth 2 (two) times daily.    . trandolapril-verapamil (TARKA) 4-240 MG per tablet Take 1/2 tablet daily    . UNABLE TO FIND Take 20 mg by mouth daily. Zeaxanthin daily    . VITAMIN A PO Take 2,300 mcg by mouth daily.     No current facility-administered medications for this visit.     No Known Allergies  Review of Systems negative except from HPI and PMH  Physical Exam BP 126/70   Pulse 72   Ht 6' (1.829 m)   Wt 177 lb 9.6 oz (80.6 kg)   SpO2 98%   BMI 24.09 kg/m  Well developed and well nourished in no acute distress HENT normal Neck supple with JVP-flat Clear Device pocket well healed; without hematoma or erythema.  There is no tethering  Regular rate and rhythm, no   murmur Abd-soft with active BS No Clubbing cyanosis edema  Dystrophic R amr Skin-warm and dry A & Oriented  Grossly  normal sensory and motor function  ECG sinus with P-synchronous/ AV  pacing Rare PVCs     Assessment and  Plan  1. Complete heart block- intermittent .   2. Pacemaker: St. Jude    The patient's device was interrogated.  The information was reviewed. No changes were made in the programming.      Hypertension well-controlled\   Atrial fib-paroxysmal SCAF   PVCs  Frequent   COPD     Will plan echo next fall with high PVC burden and V pacing burden   Subclinical Atrial fibrillation Duration max 14 hr so will not anticoagulate

## 2019-07-26 LAB — CUP PACEART INCLINIC DEVICE CHECK
Battery Remaining Longevity: 48 mo
Battery Voltage: 2.83 V
Brady Statistic RA Percent Paced: 26 %
Brady Statistic RV Percent Paced: 77 %
Date Time Interrogation Session: 20201208165900
Implantable Lead Implant Date: 20111128
Implantable Lead Implant Date: 20111128
Implantable Lead Location: 753859
Implantable Lead Location: 753860
Implantable Pulse Generator Implant Date: 20111128
Lead Channel Impedance Value: 400 Ohm
Lead Channel Impedance Value: 512.5 Ohm
Lead Channel Pacing Threshold Amplitude: 0.75 V
Lead Channel Pacing Threshold Amplitude: 1 V
Lead Channel Pacing Threshold Pulse Width: 0.4 ms
Lead Channel Pacing Threshold Pulse Width: 0.4 ms
Lead Channel Sensing Intrinsic Amplitude: 10.4 mV
Lead Channel Sensing Intrinsic Amplitude: 2.8 mV
Lead Channel Setting Pacing Amplitude: 1.25 V
Lead Channel Setting Pacing Amplitude: 1.75 V
Lead Channel Setting Pacing Pulse Width: 0.4 ms
Lead Channel Setting Sensing Sensitivity: 2.5 mV
Pulse Gen Model: 2110
Pulse Gen Serial Number: 7132800

## 2019-08-25 ENCOUNTER — Ambulatory Visit: Payer: Medicare Other

## 2019-08-25 NOTE — Progress Notes (Unsigned)
   [facility]

## 2019-09-15 ENCOUNTER — Ambulatory Visit: Payer: Medicare Other | Attending: Internal Medicine

## 2019-09-15 DIAGNOSIS — Z23 Encounter for immunization: Secondary | ICD-10-CM | POA: Insufficient documentation

## 2019-09-15 NOTE — Progress Notes (Signed)
   [id number] Vaccination [facility]  Name:  Gerald Hardin.    MRN: [id number] DOB: 1934/06/19  09/15/2019  [name] was observed post Covid-19 immunization for 15 minutes without incidence. He was provided with Vaccine Information Sheet and instruction to access the V-Safe system.   [name] was instructed to call 911 with any severe reactions post vaccine: Marland Kitchen Difficulty breathing  . Swelling of your face and throat  . A fast heartbeat  . A bad rash all over your body  . Dizziness and weakness    Immunizations Administered    Name Date Dose VIS Date Route   Pfizer COVID-19 Vaccine 09/15/2019  2:16 PM 0.3 mL 07/16/2019 Intramuscular   Manufacturer: [place]   Lot: [id number]   [place]: [id number]

## 2019-12-09 ENCOUNTER — Other Ambulatory Visit: Payer: Self-pay

## 2019-12-09 ENCOUNTER — Ambulatory Visit (INDEPENDENT_AMBULATORY_CARE_PROVIDER_SITE_OTHER): Payer: Medicare Other | Admitting: *Deleted

## 2019-12-09 DIAGNOSIS — Z950 Presence of cardiac pacemaker: Secondary | ICD-10-CM

## 2019-12-09 DIAGNOSIS — I442 Atrioventricular block, complete: Secondary | ICD-10-CM | POA: Diagnosis not present

## 2019-12-09 LAB — CUP PACEART INCLINIC DEVICE CHECK
Battery Remaining Longevity: 22 mo
Battery Voltage: 2.75 V
Brady Statistic RA Percent Paced: 21 %
Brady Statistic RV Percent Paced: 97 %
Date Time Interrogation Session: 20210506114300
Implantable Lead Implant Date: 20111128
Implantable Lead Implant Date: 20111128
Implantable Lead Location: 753859
Implantable Lead Location: 753860
Implantable Pulse Generator Implant Date: 20111128
Lead Channel Impedance Value: 412.5 Ohm
Lead Channel Impedance Value: 512.5 Ohm
Lead Channel Pacing Threshold Amplitude: 0.625 V
Lead Channel Pacing Threshold Amplitude: 1.125 V
Lead Channel Pacing Threshold Pulse Width: 0.4 ms
Lead Channel Pacing Threshold Pulse Width: 0.4 ms
Lead Channel Sensing Intrinsic Amplitude: 4.1 mV
Lead Channel Sensing Intrinsic Amplitude: 9 mV
Lead Channel Setting Pacing Amplitude: 1.375
Lead Channel Setting Pacing Amplitude: 1.625
Lead Channel Setting Pacing Pulse Width: 0.4 ms
Lead Channel Setting Sensing Sensitivity: 2.5 mV
Pulse Gen Model: 2110
Pulse Gen Serial Number: 7132800

## 2019-12-09 NOTE — Progress Notes (Signed)
Pacemaker check in clinic. Normal device function. Thresholds, sensing, impedances consistent with previous measurements. Device programmed to maximize longevity. [place] entries with 29 EGMs  that show AF with controlled v-rates , longest episode 4 hours 36 minutes.Marland Kitchen AT/AF burden 8.9%.No high ventricular rates noted. Device programmed at appropriate safety margins. Histogram distribution appropriate for patient activity level. Device programmed to optimize intrinsic conduction. Estimated longevity 1 yr 10 months. Patient is not enrolled in remote follow up, next in clinic check in 6 months. Patient education complete.

## 2020-05-16 ENCOUNTER — Other Ambulatory Visit (HOSPITAL_BASED_OUTPATIENT_CLINIC_OR_DEPARTMENT_OTHER): Payer: Self-pay | Admitting: Internal Medicine

## 2020-05-16 ENCOUNTER — Ambulatory Visit: Payer: Self-pay | Attending: Internal Medicine

## 2020-05-16 DIAGNOSIS — Z23 Encounter for immunization: Secondary | ICD-10-CM

## 2020-05-16 NOTE — Progress Notes (Signed)
   Covid-19 Vaccination Clinic  Name:  Florence Antonelli    MRN: [id number] DOB: 03-20-1934  05/16/2020  Mr. Jaimes Eckert was observed post Covid-19 immunization for 15 minutes without incident. He was provided with Vaccine Information Sheet and instruction to access the V-Safe system. Vaccinated by Kristeen Miss.  Mr. Clell Trahan was instructed to call 911 with any severe reactions post vaccine: Marland Kitchen Difficulty breathing  . Swelling of face and throat  . A fast heartbeat  . A bad rash all over body  . Dizziness and weakness

## 2020-05-24 MED FILL — PFIZER-BIONTECH COVID-19 VA: 30 | 1 days supply | Qty: 0 | Fill #0

## 2020-06-06 ENCOUNTER — Encounter: Payer: Self-pay | Admitting: Internal Medicine

## 2020-06-06 ENCOUNTER — Ambulatory Visit (INDEPENDENT_AMBULATORY_CARE_PROVIDER_SITE_OTHER): Payer: Medicare Other | Admitting: Internal Medicine

## 2020-06-06 ENCOUNTER — Other Ambulatory Visit: Payer: Self-pay

## 2020-06-06 VITALS — BP 94/48 | HR 73 | Ht 72.0 in | Wt 169.2 lb

## 2020-06-06 DIAGNOSIS — Z950 Presence of cardiac pacemaker: Secondary | ICD-10-CM

## 2020-06-06 DIAGNOSIS — I442 Atrioventricular block, complete: Secondary | ICD-10-CM | POA: Diagnosis not present

## 2020-06-06 MED ORDER — APIXABAN 5 MG PO TABS: 5.0000 mg | ORAL_TABLET | Freq: Two times a day (BID) | ORAL | 3 refills | Status: DC

## 2020-06-06 NOTE — Patient Instructions (Signed)
Medication Instructions:  Your physician has recommended you make the following change in your medication:   ** Begin Eliquis 5mg  - 1 tablet by mouth twice daily.  *If you need a refill on your cardiac medications before your next appointment, please call your pharmacy*   Lab Work: None ordered.  If you have labs (blood work) drawn today and your tests are completely normal, you will receive your results only by: Marland Kitchen MyChart Message (if you have MyChart) OR . A paper copy in the mail If you have any lab test that is abnormal or we need to change your treatment, we will call you to review the results.   Testing/Procedures: None ordered.    Follow-Up: At [facility], you and your health needs are our priority.  As part of our continuing mission to provide you with exceptional heart care, we have created designated Provider Care Teams.  These Care Teams include your primary Cardiologist (physician) and Advanced Practice Providers (APPs -  Physician Assistants and Nurse Practitioners) who all work together to provide you with the care you need, when you need it.  We recommend signing up for the patient portal called "MyChart".  Sign up information is provided on this After Visit Summary.  MyChart is used to connect with patients for Virtual Visits (Telemedicine).  Patients are able to view lab/test results, encounter notes, upcoming appointments, etc.  Non-urgent messages can be sent to your provider as well.   To learn more about what you can do with MyChart, go to [email or web address].    Your next appointment:   3 month(s)  The format for your next appointment:   In Person  Provider:   Virl Axe, MD

## 2020-06-06 NOTE — Progress Notes (Signed)
HPI  Gerald Hardin. is a 86 y.o. male seen following pacemaker implantation for complete heart block and syncope in  December 2012. At that time he presented with syncope.   Echocardiogram November 2011 demonstrated normal left ventricular function without wall motion abnormalities. It mildly elevated pulmonary pressures  Chronically SOB  No edema  Not very ambulatory  Recurrent L sided chest pain brief and stabbing and not assoc with activity   No palpitations  Date Cr K Hgb  10/21 1.39               Past Medical History:  Diagnosis Date  . 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 Cataracts, bilateral   . CHB (complete heart block) ([place]) nov 2011  . CKD (chronic kidney disease), stage II   . COPD (chronic obstructive pulmonary disease) ([place])   . Diabetes mellitus   . DVT of upper extremity (deep vein thrombosis) ([place])    post pacer  . Hyperlipidemia   . Hypertension   . Macular degeneration   . Pacemaker    st judes    Past Surgical History:  Procedure Laterality Date  . PACEMAKER PLACEMENT  Jul 02 2010  . SQUAMOUS CELL CARCINOMA EXCISION     and XRT  . xrt      Current Outpatient Medications  Medication Sig Dispense Refill  . acetaminophen (TYLENOL) 500 MG tablet Take 500 mg by mouth every 6 (six) hours as needed.     Marland Kitchen b complex vitamins tablet Take 1 tablet by mouth daily.      . Coenzyme Q10 (COQ10 PO) Take by mouth every morning.      Marland Kitchen Dextromethorphan-Guaifenesin (MUCINEX DM PO) Take by mouth 2 (two) times daily.      . fenofibrate (TRICOR) 145 MG tablet Take 145 mg by mouth at bedtime.      . Glucosamine-Chondroitin 250-200 MG CAPS Take by mouth. Take two tablets every morning     . Lactobacillus (ACIDOPHILUS) CAPS Take by mouth. Take 1-3 capsules three times a day with meals     . Misc Natural Products (LUTEIN 20 PO) Take 20 mg by mouth daily.    . Niacin (NICOTINIC ACID PO) Take 250 mg by mouth 2 (two) times daily.    . repaglinide  (PRANDIN) 2 MG tablet Take 2 mg by mouth 3 (three) times daily before meals.      . simvastatin (ZOCOR) 10 MG tablet Take 1 tablet (10 mg total) by mouth at bedtime. 30 tablet 6  . SODIUM BICARBONATE PO Take 162-165 mg by mouth daily.    Marland Kitchen Specialty Vitamins Products (ICAPS LUTEIN-ZEAXANTHIN PO) Take 1 capsule by mouth 2 (two) times daily.    . trandolapril-verapamil (TARKA) 4-240 MG per tablet Take 1/2 tablet daily    . UNABLE TO FIND Take 20 mg by mouth daily. Zeaxanthin daily    . VITAMIN A PO Take 2,300 mcg by mouth daily.     No current facility-administered medications for this visit.    No Known Allergies  Review of Systems negative except from HPI and PMH  Physical Exam BP (!) 94/48   Pulse 73   Ht 6' (1.829 m)   Wt 169 lb 3.2 oz (76.7 kg)   SpO2 97%   BMI 22.95 kg/m  Well developed and well nourished in no acute distress HENT normal Neck supple with JVP-flat Clear Device pocket well healed; without hematoma or erythema.  There is  no tethering  Regular rate and rhythm, no  murmur Abd-soft with active BS No Clubbing cyanosis edema Skin-warm and dry A & Oriented  Grossly normal sensory and motor function dystrophic r arm  ECG sinus @ 73 27/12/40 PACs    Assessment and  Plan  Complete heart block- intermittent .   Pacemaker: St. Jude       Hypertension well-controlled   Atrial fib-SCAF now greater than 2 days  PVCs  Frequent   COPD  We will plan echocardiogram at 3 months visit.  With more prolonged atrial fibrillation detected on his device no greater than 2 days, based on Assert 2 trial, recommended anticoagulation and have reviewed with him the risks and benefits.  Overall I have estimated an absolute benefit of 3+ percent per year based on a stroke risk of 5-6%.  He will consider and proceed.  He does not do remote follow-up.  His device is approaching ERI.  We will see him in 3 months.  He is unable to appreciate the auditory alert  History of  hypertension but blood pressure is now low.  It makes 1 wonder about vascular disease in this chronic smoker.  Continues with ectopy.  Although on  ECG today it appears more likely to be PACs

## 2020-09-25 ENCOUNTER — Encounter: Payer: Medicare Other | Admitting: Internal Medicine

## 2020-09-26 ENCOUNTER — Ambulatory Visit (INDEPENDENT_AMBULATORY_CARE_PROVIDER_SITE_OTHER): Payer: Medicare Other | Admitting: Internal Medicine

## 2020-09-26 ENCOUNTER — Other Ambulatory Visit: Payer: Self-pay

## 2020-09-26 VITALS — BP 129/55 | HR 69 | Ht 72.0 in | Wt 169.0 lb

## 2020-09-26 DIAGNOSIS — Z950 Presence of cardiac pacemaker: Secondary | ICD-10-CM | POA: Diagnosis not present

## 2020-09-26 DIAGNOSIS — I442 Atrioventricular block, complete: Secondary | ICD-10-CM

## 2020-09-26 DIAGNOSIS — I4891 Unspecified atrial fibrillation: Secondary | ICD-10-CM | POA: Diagnosis not present

## 2020-09-26 LAB — CUP PACEART INCLINIC DEVICE CHECK
Battery Remaining Longevity: 4 mo
Battery Voltage: 2.65 V
Brady Statistic RA Percent Paced: 24 %
Brady Statistic RV Percent Paced: 99 %
Date Time Interrogation Session: 20220222153432
Implantable Lead Implant Date: 20111128
Implantable Lead Implant Date: 20111128
Implantable Lead Location: 753859
Implantable Lead Location: 753860
Implantable Pulse Generator Implant Date: 20111128
Lead Channel Impedance Value: 400 Ohm
Lead Channel Impedance Value: 525 Ohm
Lead Channel Pacing Threshold Amplitude: 0.625 V
Lead Channel Pacing Threshold Amplitude: 1 V
Lead Channel Pacing Threshold Pulse Width: 0.4 ms
Lead Channel Pacing Threshold Pulse Width: 0.4 ms
Lead Channel Sensing Intrinsic Amplitude: 2.2 mV
Lead Channel Sensing Intrinsic Amplitude: 8.3 mV
Lead Channel Setting Pacing Amplitude: 1.25 V
Lead Channel Setting Pacing Amplitude: 1.625
Lead Channel Setting Pacing Pulse Width: 0.4 ms
Lead Channel Setting Sensing Sensitivity: 2.5 mV
Pulse Gen Model: 2110
Pulse Gen Serial Number: 7132800

## 2020-09-26 NOTE — Patient Instructions (Signed)
Medication Instructions:  Your physician recommends that you continue on your current medications as directed. Please refer to the Current Medication list given to you today.  *If you need a refill on your cardiac medications before your next appointment, please call your pharmacy*   Lab Work: None ordered.  If you have labs (blood work) drawn today and your tests are completely normal, you will receive your results only by: Marland Kitchen MyChart Message (if you have MyChart) OR . A paper copy in the mail If you have any lab test that is abnormal or we need to change your treatment, we will call you to review the results.   Testing/Procedures: None ordered.    Follow-Up: At [facility], you and your health needs are our priority.  As part of our continuing mission to provide you with exceptional heart care, we have created designated Provider Care Teams.  These Care Teams include your primary Cardiologist (physician) and Advanced Practice Providers (APPs -  Physician Assistants and Nurse Practitioners) who all work together to provide you with the care you need, when you need it.  We recommend signing up for the patient portal called "MyChart".  Sign up information is provided on this After Visit Summary.  MyChart is used to connect with patients for Virtual Visits (Telemedicine).  Patients are able to view lab/test results, encounter notes, upcoming appointments, etc.  Non-urgent messages can be sent to your provider as well.   To learn more about what you can do with MyChart, go to [email or web address].    Your next appointment:   10/24/2020 at 2pm in device clinic for pacemaker check

## 2020-09-26 NOTE — Progress Notes (Signed)
HPI  Gerald Hardin. is a 87 y.o. male seen following pacemaker implantation for complete heart block and syncope in  December 2012. At that time he presented with syncope.   Echocardiogram November 2011 demonstrated normal left ventricular function without wall motion abnormalities. It mildly elevated pulmonary pressures  Chronically sob, mostly in a chair No edema No chest pain No palpitations  Date Cr K Hgb  10/21 1.39               Past Medical History:  Diagnosis Date  . 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 Cataracts, bilateral   . CHB (complete heart block) ([place]) nov 2011  . CKD (chronic kidney disease), stage II   . COPD (chronic obstructive pulmonary disease) ([place])   . Diabetes mellitus   . DVT of upper extremity (deep vein thrombosis) ([place])    post pacer  . Hyperlipidemia   . Hypertension   . Macular degeneration   . Pacemaker    st judes    Past Surgical History:  Procedure Laterality Date  . PACEMAKER PLACEMENT  Jul 02 2010  . SQUAMOUS CELL CARCINOMA EXCISION     and XRT  . xrt      Current Outpatient Medications  Medication Sig Dispense Refill  . acetaminophen (TYLENOL) 500 MG tablet Take 500 mg by mouth every 6 (six) hours as needed.     Marland Kitchen apixaban (ELIQUIS) 5 MG TABS tablet Take 1 tablet (5 mg total) by mouth 2 (two) times daily. 180 tablet 3  . b complex vitamins tablet Take 1 tablet by mouth daily.      . Coenzyme Q10 (COQ10 PO) Take by mouth every morning.      Marland Kitchen Dextromethorphan-Guaifenesin (MUCINEX DM PO) Take by mouth 2 (two) times daily.      . fenofibrate (TRICOR) 145 MG tablet Take 145 mg by mouth at bedtime.      . Glucosamine-Chondroitin 250-200 MG CAPS Take by mouth. Take two tablets every morning     . Lactobacillus (ACIDOPHILUS) CAPS Take by mouth. Take 1-3 capsules three times a day with meals     . Misc Natural Products (LUTEIN 20 PO) Take 20 mg by mouth daily.    . Niacin (NICOTINIC ACID PO) Take 250 mg by mouth 2 (two)  times daily.    . repaglinide (PRANDIN) 2 MG tablet Take 2 mg by mouth 3 (three) times daily before meals.      . simvastatin (ZOCOR) 10 MG tablet Take 1 tablet (10 mg total) by mouth at bedtime. 30 tablet 6  . SODIUM BICARBONATE PO Take 162-165 mg by mouth daily.    Marland Kitchen Specialty Vitamins Products (ICAPS LUTEIN-ZEAXANTHIN PO) Take 1 capsule by mouth 2 (two) times daily.    . trandolapril-verapamil (TARKA) 4-240 MG per tablet Take 1/2 tablet daily    . UNABLE TO FIND Take 20 mg by mouth daily. Zeaxanthin daily    . VITAMIN A PO Take 2,300 mcg by mouth daily.     No current facility-administered medications for this visit.    No Known Allergies  Review of Systems negative except from HPI and PMH  Physical Exam BP (!) 129/55   Pulse 69   Ht 6' (1.829 m)   Wt 169 lb (76.7 kg)   BMI 22.92 kg/m  Well developed and well nourished in no acute distress  Clear Regular rate and rhythm  No Clubbing cyanosis   Skin-warm and dry  A & Oriented  Grossly normal sensory and motor function      Assessment and  Plan  Complete heart block- intermittent .   Pacemaker: St. Jude       Hypertension well-controlled   Atrial fib-SCAF now greater than 2 days  PVCs  Frequent   COPD  Device approaching within < 4 months --not on remote so will plan q 1 month device check

## 2020-09-29 ENCOUNTER — Telehealth: Payer: Self-pay | Admitting: Internal Medicine

## 2020-09-29 NOTE — Telephone Encounter (Signed)
Patient states he is returning a call received today, 09/29/20.  However, he is unsure who called or what the call was regarding.  I contacted the device clinic and I was told no one contacted the patient today.

## 2020-10-24 ENCOUNTER — Ambulatory Visit (INDEPENDENT_AMBULATORY_CARE_PROVIDER_SITE_OTHER): Payer: Medicare Other | Admitting: Emergency Medicine

## 2020-10-24 ENCOUNTER — Other Ambulatory Visit: Payer: Self-pay

## 2020-10-24 DIAGNOSIS — I442 Atrioventricular block, complete: Secondary | ICD-10-CM

## 2020-10-24 NOTE — Progress Notes (Signed)
In-clinic 1 month battery check. Battery voltage 2.62 V( 2.60 V =ERI). Patient declines [place] for AF. In-clinic battery check scheduled for 11/28/20.

## 2020-11-14 ENCOUNTER — Ambulatory Visit: Payer: Medicare Other | Attending: Internal Medicine

## 2020-11-14 DIAGNOSIS — Z23 Encounter for immunization: Secondary | ICD-10-CM

## 2020-11-14 NOTE — Progress Notes (Signed)
   [id number]  Name:  Gerald Hardin.    MRN: [id number] DOB: 10-26-1933  11/14/2020  Mr. Gerald Hardin was observed post Covid-19 immunization for 15 minutes without incident. He was provided with Vaccine Information Sheet and instruction to access the V-Safe system.   Mr. Gerald Hardin was instructed to call 911 with any severe reactions post vaccine: Marland Kitchen Difficulty breathing  . Swelling of face and throat  . A fast heartbeat  . A bad rash all over body  . Dizziness and weakness   Immunizations Administered    Name Date Dose VIS Date Route   PFIZER Comrnaty(Gray TOP) Covid-19 Vaccine 11/14/2020  1:14 PM 0.3 mL 07/13/2020 Intramuscular   Manufacturer: [place]   Lot: [id number]   NDC: [phone]

## 2020-11-20 ENCOUNTER — Other Ambulatory Visit (HOSPITAL_BASED_OUTPATIENT_CLINIC_OR_DEPARTMENT_OTHER): Payer: Self-pay

## 2020-11-20 MED ORDER — PFIZER-BIONT COVID-19 VAC-TRIS 30 MCG/0.3ML IM SUSP
INTRAMUSCULAR | 0 refills | Status: DC
  Filled 2020-11-20: qty 0.3, 1d supply, fill #0

## 2020-11-28 ENCOUNTER — Ambulatory Visit (INDEPENDENT_AMBULATORY_CARE_PROVIDER_SITE_OTHER): Payer: Medicare Other | Admitting: Emergency Medicine

## 2020-11-28 ENCOUNTER — Other Ambulatory Visit: Payer: Self-pay

## 2020-11-28 DIAGNOSIS — I442 Atrioventricular block, complete: Secondary | ICD-10-CM

## 2020-11-28 LAB — CUP PACEART INCLINIC DEVICE CHECK
Battery Remaining Longevity: 0 mo
Battery Voltage: 2.6 V
Brady Statistic RA Percent Paced: 24 %
Brady Statistic RV Percent Paced: 97 %
Date Time Interrogation Session: 20220426151452
Implantable Lead Implant Date: 20111128
Implantable Lead Implant Date: 20111128
Implantable Lead Location: 753859
Implantable Lead Location: 753860
Implantable Pulse Generator Implant Date: 20111128
Lead Channel Impedance Value: 387.5 Ohm
Lead Channel Impedance Value: 537.5 Ohm
Lead Channel Pacing Threshold Amplitude: 0.625 V
Lead Channel Pacing Threshold Amplitude: 0.875 V
Lead Channel Pacing Threshold Pulse Width: 0.4 ms
Lead Channel Pacing Threshold Pulse Width: 0.4 ms
Lead Channel Sensing Intrinsic Amplitude: 12 mV
Lead Channel Sensing Intrinsic Amplitude: 3.1 mV
Lead Channel Setting Pacing Amplitude: 1.125
Lead Channel Setting Pacing Amplitude: 1.625
Lead Channel Setting Pacing Pulse Width: 0.4 ms
Lead Channel Setting Sensing Sensitivity: 2.5 mV
Pulse Gen Model: 2110
Pulse Gen Serial Number: 7132800

## 2020-11-28 NOTE — Progress Notes (Signed)
Pacemaker check in clinic. Normal device function. Thresholds, sensing, impedances consistent with previous measurements. Device programmed to maximize longevity. AMS Burden 39%, longest AMS episode 1 day 22 hours on 11/17/20, peak A/V rates 640/95. +OAC.  No high ventricular rates noted. Device programmed at appropriate safety margins. Histogram distribution appropriate for patient activity level. Device programmed to optimize intrinsic conduction. Estimated longevity <1 month until ERI (Battery Voltage currently 2.60V, ERI has not triggered as of yet)  Patient refuses remote follow-up.  Next scheduled in-clinic check in 1 month 12/26/20. Patient education completed.

## 2020-12-26 ENCOUNTER — Ambulatory Visit: Payer: Medicare Other | Admitting: Emergency Medicine

## 2020-12-26 ENCOUNTER — Other Ambulatory Visit: Payer: Self-pay

## 2020-12-26 DIAGNOSIS — I442 Atrioventricular block, complete: Secondary | ICD-10-CM

## 2020-12-26 LAB — CUP PACEART INCLINIC DEVICE CHECK
Battery Remaining Longevity: 0 mo
Battery Voltage: 2.59 V
Brady Statistic RA Percent Paced: 25 %
Brady Statistic RV Percent Paced: 98 %
Date Time Interrogation Session: 20220524125740
Implantable Lead Implant Date: 20111128
Implantable Lead Implant Date: 20111128
Implantable Lead Location: 753859
Implantable Lead Location: 753860
Implantable Pulse Generator Implant Date: 20111128
Lead Channel Impedance Value: 400 Ohm
Lead Channel Impedance Value: 550 Ohm
Lead Channel Pacing Threshold Amplitude: 0.625 V
Lead Channel Pacing Threshold Amplitude: 1 V
Lead Channel Pacing Threshold Pulse Width: 0.4 ms
Lead Channel Pacing Threshold Pulse Width: 0.4 ms
Lead Channel Sensing Intrinsic Amplitude: 12 mV
Lead Channel Sensing Intrinsic Amplitude: 3.5 mV
Lead Channel Setting Pacing Amplitude: 1.25 V
Lead Channel Setting Pacing Amplitude: 1.625
Lead Channel Setting Pacing Pulse Width: 0.4 ms
Lead Channel Setting Sensing Sensitivity: 2.5 mV
Pulse Gen Model: 2110
Pulse Gen Serial Number: 7132800

## 2020-12-26 NOTE — Patient Instructions (Signed)
Scheduler will be calling to set up appointment to see Dr. Caryl Comes to discuss generator change out

## 2020-12-26 NOTE — Progress Notes (Signed)
In-clinic battery check only. Patient does not do remote transmissions. Complete device interrogation with last months battery check 11/28/20. ERI reached by voltage @ 2.59 (ERI@2 .6V). Patient is made aware he will be contacted to schedule appointment with Dr. Caryl Comes to discuss gen change. Patient wishes to be contacted at [phone].

## 2021-01-09 ENCOUNTER — Encounter: Payer: Self-pay | Admitting: Student

## 2021-01-09 ENCOUNTER — Other Ambulatory Visit: Payer: Self-pay

## 2021-01-09 ENCOUNTER — Ambulatory Visit (INDEPENDENT_AMBULATORY_CARE_PROVIDER_SITE_OTHER): Payer: Medicare Other | Admitting: Student

## 2021-01-09 VITALS — BP 130/69 | HR 60 | Ht 72.0 in | Wt 169.0 lb

## 2021-01-09 DIAGNOSIS — Z950 Presence of cardiac pacemaker: Secondary | ICD-10-CM | POA: Diagnosis not present

## 2021-01-09 DIAGNOSIS — I10 Essential (primary) hypertension: Secondary | ICD-10-CM | POA: Diagnosis not present

## 2021-01-09 DIAGNOSIS — I4891 Unspecified atrial fibrillation: Secondary | ICD-10-CM

## 2021-01-09 DIAGNOSIS — I442 Atrioventricular block, complete: Secondary | ICD-10-CM

## 2021-01-09 LAB — CUP PACEART INCLINIC DEVICE CHECK
Battery Remaining Longevity: 0 mo
Battery Voltage: 2.59 V
Brady Statistic RA Percent Paced: 31 %
Brady Statistic RV Percent Paced: 98 %
Date Time Interrogation Session: 20220607140214
Implantable Lead Implant Date: 20111128
Implantable Lead Implant Date: 20111128
Implantable Lead Location: 753859
Implantable Lead Location: 753860
Implantable Pulse Generator Implant Date: 20111128
Lead Channel Impedance Value: 375 Ohm
Lead Channel Impedance Value: 525 Ohm
Lead Channel Pacing Threshold Amplitude: 0.75 V
Lead Channel Pacing Threshold Amplitude: 1.125 V
Lead Channel Pacing Threshold Pulse Width: 0.4 ms
Lead Channel Pacing Threshold Pulse Width: 0.4 ms
Lead Channel Sensing Intrinsic Amplitude: 12 mV
Lead Channel Sensing Intrinsic Amplitude: 2.4 mV
Lead Channel Setting Pacing Amplitude: 1.375
Lead Channel Setting Pacing Amplitude: 1.75 V
Lead Channel Setting Pacing Pulse Width: 0.4 ms
Lead Channel Setting Sensing Sensitivity: 2.5 mV
Pulse Gen Model: 2110
Pulse Gen Serial Number: 7132800

## 2021-01-09 NOTE — Patient Instructions (Addendum)
Medication Instructions: Your physician recommends that you continue on your current medications as directed. Please refer to the Current Medication list given to you today.  Labwork: Your physician recommends that you return for lab work between 7/11-7/15 at any [place]- BMET and CBC   Procedures/Testing: Your physician has recommended that you have a Generator Change of your device on 02/23/2021. This is a procedure that replaces a Pacemaker ICD generator that is at the end of its service life. The remaining lifespan of a pacemaker is determined during visits to the [facility]. The battery in a pacemaker does not stop suddenly but rather loses its charge slowly, which lets the cardiologist plan the replacement date.  Follow-Up: Your physician recommends that you schedule a follow-up appointment in 10 - 14 days from  7/22 with the Device clinic for a wound check  Your physician recommends that you schedule a follow-up appointment in 3 months from 7/22 with Dr. Caryl Comes.   If you need a refill on your cardiac medications before your next appointment, please call your pharmacy.   -------------------------------------------------------------------------------------------------------------  Please wash with the CHG Soap the night before and morning of procedure (follow instruction page "Preparing For Surgery").   Please report to the [place] Entrance of [facility] at 7:30am ([place], [place] [address])  DO NOT eat or drink anything after midnight the night before procedure  You need to hold ALL medications the morning of your procedure.  You will need someone to drive you home after the procedure. ------------------------------------------------------------------------------------------------------------- [facility] - Preparing For Surgery  Before surgery, you can play an important role. Because skin is not sterile, your skin needs to be as  free of germs as possible. You can reduce the number of germs on your skin by washing with CHG (chlorahexidine gluconate) Soap before surgery.  CHG is an antiseptic cleaner which kills germs and bonds with the skin to continue killing germs even after washing.   Please do not use if you have an allergy to CHG or antibacterial soaps.  If your skin becomes reddened/irritated stop using the CHG.   Do not shave (including legs and underarms) for at least 48 hours prior to first CHG shower.  It is OK to shave your face.  Please follow these instructions carefully:  1.  Shower the night before surgery and the morning of surgery with CHG.  2.  If you choose to wash your hair, wash your hair first as usual with your normal shampoo.  3.  After you shampoo, rinse your hair and body thoroughly to remove the shampoo.  4.  Use CHG as you would any other liquid soap.  You can apply CHG directly to the skin and wash gently with a clean washcloth. 5.  Apply the CHG Soap to your body ONLY FROM THE NECK DOWN.  Do not use on open wounds or open sores.  Avoid contact with your eyes, ears, mouth and genitals (private parts).  Wash genitals (private parts) with your normal soap.  6.  Wash thoroughly, paying special attention to the area where your surgery will be performed.  7.  Thoroughly rinse your body with warm water from the neck down.   8.  DO NOT shower/wash with your normal soap after using and rinsing off the CHG soap.  9.  Pat yourself dry with a clean towel.           10.  Wear clean pajamas.  11.  Place clean sheets on your bed the night of your first shower and do not sleep with pets.  Day of Surgery: Do not apply any deodorants/lotions.  Please wear clean clothes to the hospital/surgery center.

## 2021-01-09 NOTE — Progress Notes (Signed)
Electrophysiology Office Note Date: 01/09/2021  ID:  Gerald Birkenhead., DOB 03-Oct-1933, MRN [id number]  PCP: Patient, No Pcp Per (Inactive) Primary Cardiologist: None Electrophysiologist: Virl Axe, MD   CC: Pacemaker follow-up  Gerald Ausmus. is a 87 y.o. male seen today for Virl Axe, MD for routine electrophysiology followup.  Since last being seen in our clinic the patient reports doing well. His device has met ERI by battery voltage as of 12/26/2020 check.   he denies chest pain, palpitations, dyspnea, PND, orthopnea, nausea, vomiting, dizziness, syncope, edema, weight gain, or early satiety.  Device History: St. Jude Dual Chamber PPM implanted 2011 for CHB  Past Medical History:  Diagnosis Date  . 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 Cataracts, bilateral   . CHB (complete heart block) ([place]) nov 2011  . CKD (chronic kidney disease), stage II   . COPD (chronic obstructive pulmonary disease) ([place])   . Diabetes mellitus   . DVT of upper extremity (deep vein thrombosis) ([place])    post pacer  . Hyperlipidemia   . Hypertension   . Macular degeneration   . Pacemaker    st judes   Past Surgical History:  Procedure Laterality Date  . PACEMAKER PLACEMENT  Jul 02 2010  . SQUAMOUS CELL CARCINOMA EXCISION     and XRT  . xrt      Current Outpatient Medications  Medication Sig Dispense Refill  . acetaminophen (TYLENOL) 500 MG tablet Take 500 mg by mouth every 6 (six) hours as needed.    Marland Kitchen b complex vitamins tablet Take 1 tablet by mouth daily.    . chlorpheniramine (CHLOR-TRIMETON) 2 MG/5ML syrup Take by mouth daily.    . Coenzyme Q10 (COQ10 PO) Take by mouth every morning.    Marland Kitchen COVID-19 mRNA Vac-TriS, Pfizer, (PFIZER-BIONT COVID-19 VAC-TRIS) SUSP injection Inject into the muscle. 0.3 mL 0  . COVID-19 mRNA vaccine, Pfizer, 30 MCG/0.3ML injection INJECT AS DIRECTED .3 mL 0  . Dextromethorphan-Guaifenesin (MUCINEX DM PO) Take by mouth 2 (two) times daily.    .  fenofibrate (TRICOR) 145 MG tablet Take 145 mg by mouth at bedtime.    . Glucosamine-Chondroitin 250-200 MG CAPS Take by mouth. Take two tablets every morning    . Lactobacillus (ACIDOPHILUS) CAPS Take by mouth. Take 1-3 capsules three times a day with meals    . Lutein 20 MG TABS Take 1 tablet by mouth daily.    . Misc Natural Products (LUTEIN 20 PO) Take 20 mg by mouth daily.    . Niacin (NICOTINIC ACID PO) Take 250 mg by mouth 2 (two) times daily.    . repaglinide (PRANDIN) 2 MG tablet Take 2 mg by mouth 3 (three) times daily before meals.    . simvastatin (ZOCOR) 10 MG tablet Take 1 tablet (10 mg total) by mouth at bedtime. 30 tablet 6  . SODIUM BICARBONATE PO Take 162-165 mg by mouth daily.    Marland Kitchen Specialty Vitamins Products (ICAPS LUTEIN-ZEAXANTHIN PO) Take 1 capsule by mouth 2 (two) times daily.    . trandolapril-verapamil (TARKA) 4-240 MG per tablet Take 1/2 tablet daily    . UNABLE TO FIND Take 20 mg by mouth daily. Zeaxanthin daily    . VITAMIN A PO Take 2,300 mcg by mouth daily.     No current facility-administered medications for this visit.    Allergies:   Other   Social History: Social History   Socioeconomic History  .  Marital status: Married    Spouse name: Not on file  . Number of children: Not on file  . Years of education: Not on file  . Highest education level: Not on file  Occupational History  . Not on file  Tobacco Use  . Smoking status: Smoker, Current Status Unknown    Packs/day: 1.00    Types: Cigarettes  . Smokeless tobacco: Never Used  Substance and Sexual Activity  . Alcohol use: No  . Drug use: No  . Sexual activity: Not on file  Other Topics Concern  . Not on file  Social History Narrative  . Not on file   Social Determinants of Health   Financial Resource Strain: Not on file  Food Insecurity: Not on file  Transportation Needs: Not on file  Physical Activity: Not on file  Stress: Not on file  Social Connections: Not on file  Intimate  Partner Violence: Not on file    Family History: Family History  Problem Relation Age of Onset  . Heart failure Father      Review of Systems: All other systems reviewed and are otherwise negative except as noted above.  Physical Exam: Vitals:   01/09/21 1255  BP: 130/69  Pulse: 60  SpO2: 96%  Weight: 169 lb (76.7 kg)  Height: 6' (1.829 m)     GEN- The patient is elderly appearing, alert and oriented x 3 today.   HEENT: normocephalic, atraumatic; sclera clear, conjunctiva pink; hearing intact; oropharynx clear; neck supple  Lungs- Clear to ausculation bilaterally, normal work of breathing.  No wheezes, rales, rhonchi Heart- Regular rate and rhythm, no murmurs, rubs or gallops  GI- soft, non-tender, non-distended, bowel sounds present  Extremities- no clubbing or cyanosis. No edema MS- no significant deformity or atrophy Skin- warm and dry, no rash or lesion; PPM pocket well healed Psych- euthymic mood, full affect Neuro- strength and sensation are intact  PPM Interrogation- reviewed in detail today,  See PACEART report  EKG:  EKG is not ordered today.  Recent Labs: No results found for requested labs within last 8760 hours.   Wt Readings from Last 3 Encounters:  01/09/21 169 lb (76.7 kg)  09/26/20 169 lb (76.7 kg)  06/06/20 169 lb 3.2 oz (76.7 kg)     Other studies Reviewed: Additional studies/ records that were reviewed today include: Previous EP office notes, Previous remote checks, Most recent labwork.   Assessment and Plan:  1. CHB s/p St. Jude PPM  Normal PPM function for device at ERI by battery voltage as of check 12/26/2020.  See Claudia Desanctis Art report No changes today Explained risks, benefits, and alternatives to PPM generator change including but not limited to bleeding, infection, and pneumothorax. Pt verbalized understanding and agrees to proceed. His wife heard the first audible alert, but not the second. The patient was unable to appreciate the audible  alert.   2. HTN Well controlled  3. Atrial fibrillation, paroxysmal CHA2DS2VASC of 4.  He has episodes as long as 9 hours. He refuses eliquis due to cost and his brother having had severe epistaxis on eliquis.Marland Kitchen He wishes to continue only a baby aspirin and says that Dr. Caryl Comes has previously instructed him this was sufficient.  He is unwilling to engage with me in conversation about this.  He cuts me off and shoos me out of the room when I mention coumadin to see if he is interested in considering.   Current medicines are reviewed at length with the patient today.  The patient does not have concerns regarding his medicines.  The following changes were made today:  none  Labs/ tests ordered today include:  Orders Placed This Encounter  Procedures  . Basic metabolic panel  . CBC  . CUP PACEART INCLINIC DEVICE CHECK   Disposition:   Follow up as usual with Dr. Caryl Comes post gen change.   Signed, Shirley Friar, PA-C  01/09/2021 2:10 PM  [address] [place] [id number] [phone] (office) [phone] (fax)

## 2021-01-15 ENCOUNTER — Telehealth: Payer: Self-pay | Admitting: Internal Medicine

## 2021-01-15 NOTE — Telephone Encounter (Signed)
Attempted to call patient. Unable to leave voicemail.  

## 2021-01-15 NOTE — Telephone Encounter (Signed)
Patient called in to see if we got forms from [facility] in regards to Elquis. Inform patient that Elquis is not on his list of medication. Patient stated that he still have his prescription for dec when Dr. Caryl Comes gave it to him. Patient stated at first he was hesitant bout the medication because of side effects and cost of it but now is ready to take the medication. He is getting assistant with being able to purchase this medication. Please advise

## 2021-01-16 NOTE — Telephone Encounter (Signed)
**Note De-Identified Elwyn Klosinski Obfuscation** I have not received any forms from [facility] concerning the ps Eliquis.  I did call the pt ack to discuss but got no answer and no way to leave message as the pts VM has not been set up.  I will call back later.

## 2021-01-17 NOTE — Telephone Encounter (Signed)
**Note De-identified Gerald Hardin Obfuscation** No answer and no way to leave a message

## 2021-01-18 NOTE — Telephone Encounter (Signed)
Document printed, signed, dated and placed with medical records for faxing.

## 2021-01-18 NOTE — Telephone Encounter (Signed)
**Note De-Identified Breahna Boylen Obfuscation** We did receive a [employer] Eliquis PA form for [employer] (no other identifier) Dava Rensch fax.  I called the pts home and cell phones to advise but got no answer at either number and could not leave a message on his VM as neither has be set up,  I have completed the PA form and Emailed it to Dr Olin Pia nurse so she can obtain his signature, date it and so she can fax all to [place] at the fax number written on cover letter included or to place in the "to be faxed box" in Medical Records to be faxed.

## 2021-01-22 ENCOUNTER — Telehealth: Payer: Self-pay

## 2021-01-22 NOTE — Telephone Encounter (Signed)
Spoke with pt and advised d/t cath lab schedule change pt will need to now arrive at 930am on 02/23/2021 for 1130am procedure start time.  Pt verbalizes understanding and agrees with current plan.

## 2021-01-23 ENCOUNTER — Telehealth: Payer: Self-pay | Admitting: Internal Medicine

## 2021-01-23 NOTE — Telephone Encounter (Signed)
Follow Up:    Pt said he would like for you to call him please. He said he talked to you yesterday, but have some more questions.

## 2021-01-24 NOTE — Telephone Encounter (Signed)
Spoke with pt and scheduled lab appt for pt pre-procedure CBC and BMET on 02/15/2021 at 1130am.  Pt verbalizes understanding and agrees with current plan.

## 2021-02-08 ENCOUNTER — Ambulatory Visit (INDEPENDENT_AMBULATORY_CARE_PROVIDER_SITE_OTHER): Payer: Medicare Other

## 2021-02-08 ENCOUNTER — Other Ambulatory Visit: Payer: Self-pay

## 2021-02-08 DIAGNOSIS — I442 Atrioventricular block, complete: Secondary | ICD-10-CM

## 2021-02-08 LAB — CUP PACEART INCLINIC DEVICE CHECK
Battery Remaining Longevity: 0 mo
Battery Voltage: 2.59 V
Brady Statistic RA Percent Paced: 51 %
Brady Statistic RV Percent Paced: 99 %
Date Time Interrogation Session: 20220707160357
Implantable Lead Implant Date: 20111128
Implantable Lead Implant Date: 20111128
Implantable Lead Location: 753859
Implantable Lead Location: 753860
Implantable Pulse Generator Implant Date: 20111128
Lead Channel Impedance Value: 387.5 Ohm
Lead Channel Impedance Value: 525 Ohm
Lead Channel Pacing Threshold Amplitude: 0.75 V
Lead Channel Pacing Threshold Amplitude: 1 V
Lead Channel Pacing Threshold Pulse Width: 0.4 ms
Lead Channel Pacing Threshold Pulse Width: 0.4 ms
Lead Channel Sensing Intrinsic Amplitude: 12 mV
Lead Channel Sensing Intrinsic Amplitude: 2.3 mV
Lead Channel Setting Pacing Amplitude: 1.25 V
Lead Channel Setting Pacing Amplitude: 1.75 V
Lead Channel Setting Pacing Pulse Width: 0.4 ms
Lead Channel Setting Sensing Sensitivity: 2.5 mV
Pulse Gen Model: 2110
Pulse Gen Serial Number: 7132800

## 2021-02-08 NOTE — Progress Notes (Signed)
Pt walked into clinic for eval due to Patient notifier tone being received.  Normal device function, device has reached ERI on 02/01/21.  Thresholds, sensing, impedances consistent with previous measurements. Device programmed to maximize longevity. AT/AF Burden 7%, pt refuses [place] due to cost and family hsitory of bleeding.  No high ventricular rates noted. Device programmed at appropriate safety margins. Histogram distribution appropriate for patient activity level. Device programmed to optimize intrinsic conduction. Patient is scheduled for Generator changeout procedure on 02/23/21.   Patient notifier for battery programmed off. Patient refuses remote follow-up. Patient education completed.

## 2021-02-15 ENCOUNTER — Other Ambulatory Visit: Payer: Medicare Other

## 2021-02-15 ENCOUNTER — Other Ambulatory Visit: Payer: Self-pay

## 2021-02-15 LAB — CBC
Hematocrit: 41.2 % (ref 37.5–51.0)
Hemoglobin: 14.2 g/dL (ref 13.0–17.7)
MCH: 31.8 pg (ref 26.6–33.0)
MCHC: 34.5 g/dL (ref 31.5–35.7)
MCV: 92 fL (ref 79–97)
Platelets: 229 10*3/uL (ref 150–450)
RBC: 4.47 x10E6/uL (ref 4.14–5.80)
RDW: 13.7 % (ref 11.6–15.4)
WBC: 6.5 10*3/uL (ref 3.4–10.8)

## 2021-02-15 LAB — BASIC METABOLIC PANEL
BUN/Creatinine Ratio: 10 (ref 10–24)
BUN: 11 mg/dL (ref 8–27)
CO2: 26 mmol/L (ref 20–29)
Calcium: 9.5 mg/dL (ref 8.6–10.2)
Chloride: 105 mmol/L (ref 96–106)
Creatinine, Ser: 1.15 mg/dL (ref 0.76–1.27)
Glucose: 148 mg/dL — ABNORMAL HIGH (ref 65–99)
Potassium: 4.5 mmol/L (ref 3.5–5.2)
Sodium: 141 mmol/L (ref 134–144)
eGFR: 62 mL/min/{1.73_m2} (ref >59)

## 2021-02-19 ENCOUNTER — Telehealth: Payer: Self-pay | Admitting: Internal Medicine

## 2021-02-19 NOTE — Telephone Encounter (Signed)
Patient states Dr. Dennison Nancy was supposed to send a clearance to start the eliquis. He would like to know if it was received.

## 2021-02-19 NOTE — Telephone Encounter (Signed)
Pt states Dr. Richrd Humbles office was going to fax over a clearance for pt to have shots in his eyes.  Advised we have not received anything at this time.  His wife will contact their office tomorrow and have them resend.  She also provided the information to contact Dr. Richrd Humbles office.  She said contact Courtney at [phone].  Pt wanted to get clearance from our office to start his Eliquis after his procedure on Friday.  Advised I will send to Dr. Olin Pia nurse and she will contact him after discussing with Dr. Caryl Comes.  Pt appreciative for call.

## 2021-02-21 ENCOUNTER — Telehealth: Payer: Self-pay | Admitting: Internal Medicine

## 2021-02-21 NOTE — Telephone Encounter (Signed)
Patient is following up. He states Dr. Richrd Humbles office resubmitted the fax and he would like to ensure it was received. He states a call back is not necessary unless we still haven't received it.  Please advise.

## 2021-02-21 NOTE — Telephone Encounter (Signed)
Pt is calling to speak with Rosann Auerbach in regards to his retina doctor from [facility]

## 2021-02-21 NOTE — Telephone Encounter (Addendum)
Spoke with pt and advised fax for pre-op clearance has not been received from Dr [employer] office. RN contacted Loma Sousa at Dr [facility] office and advise pre-op clearance form has not been received.  Loma Sousa states she will speak with Dr Fermin Schwab but she feels there may be some confusion as to if clearance is needed for eye injection procedure.  Dr Fermin Schwab states it is fine for pt to continue Eliquis with the injection.  She will review with Dr Fermin Schwab and contact RN at [phone] if needed.  Will await contact from Dr Mora Bellman office.

## 2021-02-22 NOTE — Telephone Encounter (Signed)
    Pt is calling back, he said he hoped he get a callback from [place] or Sonia Baller today before 4pm. He said he wanted to know if Dr. Caryl Comes, did receive a the release form for him to get back taking eliquis

## 2021-02-22 NOTE — Pre-Procedure Instructions (Signed)
Instructed patient on the following items: Arrival time 0930 Nothing to eat or drink after midnight No meds AM of procedure Responsible person to drive you home and stay with you for 24 hrs Wash with special soap night before and morning of procedure  

## 2021-02-23 ENCOUNTER — Ambulatory Visit (HOSPITAL_COMMUNITY)
Admission: RE | Admit: 2021-02-23 | Discharge: 2021-02-23 | Disposition: A | Discharge status: Home / Self Care | Payer: Medicare Other | Attending: Internal Medicine | Admitting: Internal Medicine

## 2021-02-23 ENCOUNTER — Ambulatory Visit (HOSPITAL_COMMUNITY)
Admission: RE | Disposition: A | Discharge status: Home / Self Care | Payer: Self-pay | Source: Home / Self Care | Attending: Internal Medicine

## 2021-02-23 ENCOUNTER — Other Ambulatory Visit: Payer: Self-pay

## 2021-02-23 DIAGNOSIS — E1122 Type 2 diabetes mellitus with diabetic chronic kidney disease: Secondary | ICD-10-CM | POA: Insufficient documentation

## 2021-02-23 DIAGNOSIS — Z7982 Long term (current) use of aspirin: Secondary | ICD-10-CM | POA: Diagnosis not present

## 2021-02-23 DIAGNOSIS — F17210 Nicotine dependence, cigarettes, uncomplicated: Secondary | ICD-10-CM | POA: Insufficient documentation

## 2021-02-23 DIAGNOSIS — I480 Paroxysmal atrial fibrillation: Secondary | ICD-10-CM | POA: Diagnosis not present

## 2021-02-23 DIAGNOSIS — Z8249 Family history of ischemic heart disease and other diseases of the circulatory system: Secondary | ICD-10-CM | POA: Diagnosis not present

## 2021-02-23 DIAGNOSIS — Z79899 Other long term (current) drug therapy: Secondary | ICD-10-CM | POA: Diagnosis not present

## 2021-02-23 DIAGNOSIS — Z45010 Encounter for checking and testing of cardiac pacemaker pulse generator [battery]: Secondary | ICD-10-CM | POA: Insufficient documentation

## 2021-02-23 DIAGNOSIS — I442 Atrioventricular block, complete: Secondary | ICD-10-CM | POA: Diagnosis not present

## 2021-02-23 DIAGNOSIS — I129 Hypertensive chronic kidney disease with stage 1 through stage 4 chronic kidney disease, or unspecified chronic kidney disease: Secondary | ICD-10-CM | POA: Diagnosis not present

## 2021-02-23 DIAGNOSIS — Z91048 Other nonmedicinal substance allergy status: Secondary | ICD-10-CM | POA: Insufficient documentation

## 2021-02-23 DIAGNOSIS — Z91012 Allergy to eggs: Secondary | ICD-10-CM | POA: Diagnosis not present

## 2021-02-23 DIAGNOSIS — J449 Chronic obstructive pulmonary disease, unspecified: Secondary | ICD-10-CM | POA: Insufficient documentation

## 2021-02-23 DIAGNOSIS — N182 Chronic kidney disease, stage 2 (mild): Secondary | ICD-10-CM | POA: Diagnosis not present

## 2021-02-23 HISTORY — PX: PPM GENERATOR CHANGEOUT: EP1233

## 2021-02-23 LAB — GLUCOSE, CAPILLARY
Glucose-Capillary: 101 mg/dL — ABNORMAL HIGH (ref 70–99)
Glucose-Capillary: 78 mg/dL (ref 70–99)

## 2021-02-23 SURGERY — PPM GENERATOR CHANGEOUT

## 2021-02-23 MED ORDER — SODIUM CHLORIDE 0.9 % IV SOLN
80.0000 mg | INTRAVENOUS | Status: AC
  Administered 2021-02-23: 80 mg

## 2021-02-23 MED ORDER — CEFAZOLIN SODIUM-DEXTROSE 2-4 GM/100ML-% IV SOLN
2.0000 g | INTRAVENOUS | Status: AC
  Administered 2021-02-23: 2 g via INTRAVENOUS

## 2021-02-23 MED ORDER — LIDOCAINE HCL 1 % IJ SOLN
INTRAMUSCULAR | Status: AC
  Filled 2021-02-23: qty 20

## 2021-02-23 MED ORDER — ACETAMINOPHEN 325 MG PO TABS: 325.0000 mg | ORAL_TABLET | ORAL | Status: DC | PRN

## 2021-02-23 MED ORDER — CEFAZOLIN SODIUM-DEXTROSE 2-4 GM/100ML-% IV SOLN
INTRAVENOUS | Status: AC
  Filled 2021-02-23: qty 100

## 2021-02-23 MED ORDER — LIDOCAINE HCL (PF) 1 % IJ SOLN
INTRAMUSCULAR | Status: DC | PRN
  Administered 2021-02-23: 60 mL

## 2021-02-23 MED ORDER — SODIUM CHLORIDE 0.9 % IV SOLN: INTRAVENOUS | Status: DC

## 2021-02-23 MED ORDER — SODIUM CHLORIDE 0.9 % IV SOLN
INTRAVENOUS | Status: AC
  Filled 2021-02-23: qty 2

## 2021-02-23 MED ORDER — CHLORHEXIDINE GLUCONATE 4 % EX LIQD
60.0000 mL | Freq: Once | CUTANEOUS | Status: DC
Start: 2021-02-23 — End: 2021-02-23

## 2021-02-23 MED ORDER — APIXABAN 5 MG PO TABS: 5.0000 mg | ORAL_TABLET | Freq: Two times a day (BID) | ORAL | 3 refills | Status: DC

## 2021-02-23 MED ORDER — ONDANSETRON HCL 4 MG/2ML IJ SOLN: 4.0000 mg | Freq: Four times a day (QID) | INTRAMUSCULAR | Status: DC | PRN

## 2021-02-23 SURGICAL SUPPLY — 6 items
CABLE SURGICAL S-101-97-12 (CABLE) ×2 IMPLANT
HEMOSTAT SURGICEL 2X4 FIBR (HEMOSTASIS) ×2 IMPLANT
PACEMAKER ASSURITY DR-RF (Pacemaker) ×2 IMPLANT
PAD PRO RADIOLUCENT 2001M-C (PAD) ×2 IMPLANT
POUCH AIGIS-R ANTIBACT ICD (Mesh General) ×2 IMPLANT
TRAY PACEMAKER INSERTION (PACKS) ×2 IMPLANT

## 2021-02-23 NOTE — H&P (Signed)
Patient Care Team: Patient, No Pcp Per (Inactive) as PCP - General (General Practice) Deboraha Sprang, MD as PCP - Electrophysiology (Cardiology)   HPI  Gerald Dove Christipher Casucci. is a 87 y.o. male admitted for [place] pacemaker generator replacement now at [facility]  implanted 2012 for syncope with intermittent CHB  2011 Echo Normal LV function   Paroxysmal afib, of short duration  (<9 hrs) consistent with SCAF   Chronically SOB   Records and Results Reviewed   Past Medical History:  Diagnosis Date   CA - cancer    Right Axillary Squamous Cell   Cataracts, bilateral    CHB (complete heart block) ([place]) nov 2011   CKD (chronic kidney disease), stage II    COPD (chronic obstructive pulmonary disease) ([place])    Diabetes mellitus    DVT of upper extremity (deep vein thrombosis) ([place])    post pacer   Hyperlipidemia    Hypertension    Macular degeneration    Pacemaker    st judes    Past Surgical History:  Procedure Laterality Date   PACEMAKER PLACEMENT  Jul 02 2010   SQUAMOUS CELL CARCINOMA EXCISION     and XRT   xrt      Current Facility-Administered Medications  Medication Dose Route Frequency Provider Last Rate Last Admin   0.9 %  sodium chloride infusion   Intravenous Continuous Shirley Friar, PA-C       0.9 %  sodium chloride infusion   Intravenous Continuous Shirley Friar, PA-C       ceFAZolin (ANCEF) IVPB 2g/100 mL premix  2 g Intravenous On Call Shirley Friar, PA-C       chlorhexidine (HIBICLENS) 4 % liquid 4 application  60 mL Topical Once Shirley Friar, PA-C       gentamicin (GARAMYCIN) 80 mg in sodium chloride 0.9 % 500 mL irrigation  80 mg Irrigation On Call Shirley Friar, PA-C        Allergies  Allergen Reactions   Other Other (See Comments)    Other reaction(s): Cough, Other (See Comments) Freshly sprayed hairspray causes coughing, sneezing, and watery eyes. HAIR SPRAY CAUSES SNEEZING, WATERY EYES AND  COUGH   Do not eat Red Beef and Popcorn    Chicken Meat (Diagnostic) Other (See Comments)    [place]aceo Pro / Can eat on if cooked in non cholesterol oil   Eggs Or Egg-Derived Products Other (See Comments)    Yoke      Social History   Tobacco Use   Smoking status: Smoker, Current Status Unknown    Packs/day: 1.00    Types: Cigarettes   Smokeless tobacco: Never  Substance Use Topics   Alcohol use: No   Drug use: No     Family History  Problem Relation Age of Onset   Heart failure Father      Current Meds  Medication Sig   acetaminophen (TYLENOL) 500 MG tablet Take 1,000 mg by mouth daily before breakfast. Caplets   Ascorbic Acid (VITAMIN C) 1000 MG tablet Take 1,000 mg by mouth daily.   aspirin EC 81 MG tablet Take 81 mg by mouth at bedtime. Swallow whole.   b complex vitamins tablet Take 1 tablet by mouth at bedtime.   chlorpheniramine (CHLOR-TRIMETON) 4 MG tablet Take 4 mg by mouth daily before breakfast.   Coenzyme Q10 (COQ10) 100 MG CAPS Take 300 mg by mouth daily after breakfast.   Dextromethorphan-guaiFENesin ([place] FAST-MAX DM  MAX PO) Take 1 tablet by mouth 2 (two) times daily before a meal. 20-400 mg   fenofibrate (TRICOR) 145 MG tablet Take 145 mg by mouth at bedtime.   ferrous sulfate 325 (65 FE) MG tablet Take 325 mg by mouth 3 (three) times a week.   GLUCOSAMINE-CHONDROITIN PO Take 2 capsules by mouth daily after breakfast. 600/1200 mg   Lactobacillus (ACIDOPHILUS) CAPS Take 2 capsules by mouth 3 (three) times daily. 5-8 minutes before each meal with 1/2 cup of caffeine coffee Probiotic 100 Million   Lutein 20 MG TABS Take 20 mg by mouth daily after breakfast.   Niacin (NICOTINIC ACID PO) Take 250 mg by mouth 2 (two) times daily after a meal. "DO NOT GIVE NIACIN"   repaglinide (PRANDIN) 2 MG tablet Take 2 mg by mouth 3 (three) times daily before meals. 15 minutes before each meal   simvastatin (ZOCOR) 10 MG tablet Take 10 mg by mouth See admin instructions. Must  take after supper "Must Have AURA-Binda Brand " made in [place] ONLY   SODIUM BICARBONATE PO Take 162-165 mg by mouth at bedtime.   trandolapril-verapamil (TARKA) 4-240 MG per tablet Take 0.5 tablets by mouth at bedtime.   UNABLE TO FIND Take 20 mg by mouth daily after breakfast. Zeaxanthin   VITAMIN A PO Take 2,300 mcg by mouth daily after breakfast.   Vitamin D3 (VITAMIN D) 25 MCG tablet Take 1,000 Units by mouth every other day.     Review of Systems negative except from HPI and PMH  Physical Exam BP (!) 141/52 Comment: 2nd bp after not talking  Pulse (!) 56   Temp 97.8 F (36.6 C) (Oral)   Ht 6' (1.829 m)   Wt 77.1 kg   SpO2 99%   BMI 23.06 kg/m  Well developed and well nourished in no acute distress HENT normal E scleral and icterus clear Neck Supple Wheezes Device pocket well healed; without hematoma or erythema.  There is no tethering  There is signicant atrophy in the area related to his cancer treatment Regular rate and rhythm, no murmurs gallops or rub Soft with active bowel sounds No clubbing cyanosis  Edema Alert and oriented, grossly normal motor and sensory function Skin Warm and Dry    Assessment and  Plan  Complete heart block- intermittent .   Pacemaker: St. Jude        Hypertension well-controlled   Atrial fib-SCAF now greater than 2 days   PVCs  Frequent   COPD  For generator replacement Will use aegis pouch secondary to concerns of poor healing given the "traumas' to the area   We have reviewed the benefits and risks of generator replacement.  These include but are not limited to lead fracture and infection.  The patient understands, agrees and is willing to proceed.

## 2021-02-23 NOTE — Discharge Instructions (Signed)

## 2021-02-26 ENCOUNTER — Encounter (HOSPITAL_COMMUNITY): Payer: Self-pay | Admitting: Internal Medicine

## 2021-02-26 MED FILL — Lidocaine HCl Local Inj 1%: INTRAMUSCULAR | Qty: 60 | Status: AC

## 2021-02-26 NOTE — Telephone Encounter (Signed)
Spoke with pt and advised fax for pre-op clearance has not been received from Dr [employer] office. RN contacted Loma Sousa at Dr [facility] office and advise pre-op clearance form has not been received.  Loma Sousa states she will speak with Dr Fermin Schwab but she feels there may be some confusion as to if clearance is needed for eye injection procedure.  Dr Fermin Schwab states it is fine for pt to continue Eliquis with the injection.  She will review with Dr Fermin Schwab and contact RN at [phone] if needed.  Will await contact from Dr Mora Bellman office.

## 2021-03-07 ENCOUNTER — Ambulatory Visit (INDEPENDENT_AMBULATORY_CARE_PROVIDER_SITE_OTHER): Payer: Medicare Other

## 2021-03-07 ENCOUNTER — Telehealth: Payer: Self-pay

## 2021-03-07 ENCOUNTER — Other Ambulatory Visit: Payer: Self-pay

## 2021-03-07 DIAGNOSIS — I442 Atrioventricular block, complete: Secondary | ICD-10-CM | POA: Diagnosis not present

## 2021-03-07 LAB — CUP PACEART INCLINIC DEVICE CHECK
Battery Remaining Longevity: 112 mo
Battery Voltage: 3.08 V
Brady Statistic RA Percent Paced: 76 %
Brady Statistic RV Percent Paced: 99 %
Date Time Interrogation Session: 20220803122549
Implantable Lead Implant Date: 20111128
Implantable Lead Implant Date: 20111128
Implantable Lead Location: 753859
Implantable Lead Location: 753860
Implantable Pulse Generator Implant Date: 20220722
Lead Channel Impedance Value: 400 Ohm
Lead Channel Impedance Value: 525 Ohm
Lead Channel Pacing Threshold Amplitude: 0.75 V
Lead Channel Pacing Threshold Amplitude: 1.125 V
Lead Channel Pacing Threshold Pulse Width: 0.4 ms
Lead Channel Pacing Threshold Pulse Width: 0.4 ms
Lead Channel Sensing Intrinsic Amplitude: 1.9 mV
Lead Channel Setting Pacing Amplitude: 1.375
Lead Channel Setting Pacing Amplitude: 1.75 V
Lead Channel Setting Pacing Pulse Width: 0.4 ms
Lead Channel Setting Sensing Sensitivity: 2.5 mV
Pulse Gen Model: 2272
Pulse Gen Serial Number: 3936953

## 2021-03-07 NOTE — Telephone Encounter (Signed)
Our office has yet to receive a clearance request from [facility] for this pt. I did reach out to [facility] with [facility], Dr. Dennison Nancy office [phone]. Per my conversation with Loma Sousa we discussed that the pt was seen by our [facility] today and asked abut the clearance form for his cataract surgery. Per Loma Sousa, surgery has not been scheduled yet as well as the pt is going to need an appt with their office before a clearance request will be sent over to our office. Loma Sousa stated she works in the retina area with Dr. Dennison Nancy, however the clearance will be coming from the cornea dept for Dr. Dennison Nancy. Dr. Richrd Humbles office will contact the pt about an appt. I have asked once they are ready to plan surgery for the pt to please fax over a clearance request to our office; fax # [phone] ATTN: PRE OP TEAM. Will have the pre op provider assessment at that time for clearance. Loma Sousa thanked me for the call today. I will send as FYI only to Dr. Olin Pia nurse Rosann Auerbach and device clinic.

## 2021-03-07 NOTE — Telephone Encounter (Signed)
Patient seen today in device clinic. Patient was requesting for from [place] that was faxed over per patient in regard to his cataract surgery and Eliquis medication regimen. Unable to find any forms scanned into Epic from [place] around date given per patient of 02/16/21. Patient states he has called our office "several times and our fax machine must be out of paper." Apologized to patient. Advised patient I would form Dr. Aquilla Hacker nurse Rosann Auerbach to see if she is able to assist any further? Patient agreeable to plan.

## 2021-03-07 NOTE — Telephone Encounter (Signed)
Patient was given remote monitor at gen change. He has not plugged in. Instructed to do so. Please follow up to see if it has connected.

## 2021-03-07 NOTE — Progress Notes (Signed)
Wound check appointment. Dermabond removed. Wound without redness or edema. Incision edges approximated, wound well healed. Normal device function. Thresholds, sensing, and impedances consistent with implant measurements. Device programmed at chronic lead settings d/t mature leads. Histogram distribution appropriate for patient and level of activity. AT/AF burden 7.8%, EGMs appear AF w/ controlled ventricular rates, longest in duration 8 hrs 16 minutes. [place] on file ~ Eliquis. No or high ventricular rates noted. Patient educated about wound care and arm mobility. ROV in 3 months with Dr. Caryl Comes. Patient has remote monitor at home but not plugged in. Patient instructed to do so.

## 2021-03-07 NOTE — Patient Instructions (Addendum)
   After Your Pacemaker   Monitor your pacemaker site for redness, swelling, and drainage. Call the device clinic at [phone] if you experience these symptoms or fever/chills.  Your incision was closed with Dermabond:  You may shower 1 day after your defibrillator implant and wash your incision with soap and water. Avoid lotions, ointments, or perfumes over your incision until it is well-healed.  You may use a hot tub or a pool after your wound check appointment if the incision is completely closed.  You may drive, unless driving has been restricted by your healthcare providers.  Your Pacemaker is MRI compatible.  Remote monitoring is used to monitor your pacemaker from home. This monitoring is scheduled every 91 days by our office. It allows us to keep an eye on the functioning of your device to ensure it is working properly. You will routinely see your Electrophysiologist annually (more often if necessary).  

## 2021-03-08 NOTE — Telephone Encounter (Signed)
The patient monitor did connect with the patient.  Last communication date is 03/07/2021.

## 2021-03-27 ENCOUNTER — Telehealth: Payer: Self-pay | Admitting: Internal Medicine

## 2021-03-27 NOTE — Telephone Encounter (Signed)
Spoke to the patient and he is asking that 2 copies of a letter sent from Dr Dennison Nancy office be mailed out to him. Verified address patient would like the letters mailed to. Advised patient I would place in the office mail today to be picked up.

## 2021-03-27 NOTE — Telephone Encounter (Signed)
Pt would like a callback from [facility] or her assistant. Pt would not elaborate on what he wanted he only said "it's too complicated" to explain. Please callback at his monther in [place]  on phone number listed, No Text, No Voicemail Please advise pt further

## 2021-05-21 DIAGNOSIS — I480 Paroxysmal atrial fibrillation: Secondary | ICD-10-CM | POA: Insufficient documentation

## 2021-05-28 ENCOUNTER — Ambulatory Visit (INDEPENDENT_AMBULATORY_CARE_PROVIDER_SITE_OTHER): Payer: Medicare Other

## 2021-05-28 DIAGNOSIS — Z950 Presence of cardiac pacemaker: Secondary | ICD-10-CM

## 2021-05-28 DIAGNOSIS — I442 Atrioventricular block, complete: Secondary | ICD-10-CM | POA: Diagnosis not present

## 2021-05-29 ENCOUNTER — Encounter: Payer: Medicare Other | Admitting: Internal Medicine

## 2021-05-29 DIAGNOSIS — Z950 Presence of cardiac pacemaker: Secondary | ICD-10-CM

## 2021-05-29 DIAGNOSIS — I442 Atrioventricular block, complete: Secondary | ICD-10-CM

## 2021-05-29 DIAGNOSIS — I480 Paroxysmal atrial fibrillation: Secondary | ICD-10-CM

## 2021-05-29 LAB — CUP PACEART REMOTE DEVICE CHECK
Battery Remaining Longevity: 119 mo
Battery Remaining Percentage: 95.5 %
Battery Voltage: 3.01 V
Brady Statistic AP VP Percent: 69 %
Brady Statistic AP VS Percent: 1 %
Brady Statistic AS VP Percent: 29 %
Brady Statistic AS VS Percent: 1 %
Brady Statistic RA Percent Paced: 57 %
Brady Statistic RV Percent Paced: 98 %
Date Time Interrogation Session: 20221024020016
Implantable Lead Implant Date: 20111128
Implantable Lead Implant Date: 20111128
Implantable Lead Location: 753859
Implantable Lead Location: 753860
Implantable Pulse Generator Implant Date: 20220722
Lead Channel Impedance Value: 410 Ohm
Lead Channel Impedance Value: 510 Ohm
Lead Channel Pacing Threshold Amplitude: 0.625 V
Lead Channel Pacing Threshold Amplitude: 1.125 V
Lead Channel Pacing Threshold Pulse Width: 0.4 ms
Lead Channel Pacing Threshold Pulse Width: 0.4 ms
Lead Channel Sensing Intrinsic Amplitude: 12 mV
Lead Channel Sensing Intrinsic Amplitude: 4.1 mV
Lead Channel Setting Pacing Amplitude: 1.375
Lead Channel Setting Pacing Amplitude: 1.625
Lead Channel Setting Pacing Pulse Width: 0.4 ms
Lead Channel Setting Sensing Sensitivity: 2.5 mV
Pulse Gen Model: 2272
Pulse Gen Serial Number: 3936953

## 2021-05-29 NOTE — Progress Notes (Deleted)
HPI  Gerald Hardin. is a 87 y.o. male seen following pacemaker implantation for complete heart block and syncope in  December 2012. At that time he presented with syncope.  Generator change 6/22  Echocardiogram November 2011 demonstrated normal left ventricular function without wall motion abnormalities. It mildly elevated pulmonary pressures  Chronically sob, the context of longstanding and ongoing tobacco use.    The patient denies chest pain***, shortness of breath***, nocturnal dyspnea***, orthopnea*** or peripheral edema***.  There have been no palpitations***, lightheadedness*** or syncope***.  Complains of ***.    Date Cr K Hgb  10/21 1.39      7/22 1.15 4.5 14.2      Past Medical History:  Diagnosis Date   CA - cancer    Right Axillary Squamous Cell   Cataracts, bilateral    CHB (complete heart block) ([place]) nov 2011   CKD (chronic kidney disease), stage II    COPD (chronic obstructive pulmonary disease) (HCC)    Diabetes mellitus    DVT of upper extremity (deep vein thrombosis) ([place])    post pacer   Hyperlipidemia    Hypertension    Macular degeneration    Pacemaker    st judes    Past Surgical History:  Procedure Laterality Date   PACEMAKER PLACEMENT  Jul 02 2010   [facility] GENERATOR CHANGEOUT N/A 02/23/2021   Procedure: PPM GENERATOR CHANGEOUT;  Surgeon: Deboraha Sprang, MD;  Location: [place] [facility];  Service: Cardiovascular;  Laterality: N/A;   SQUAMOUS CELL CARCINOMA EXCISION     and XRT   xrt      Current Outpatient Medications  Medication Sig Dispense Refill   acetaminophen (TYLENOL) 500 MG tablet Take 1,000 mg by mouth daily before breakfast. Caplets     apixaban (ELIQUIS) 5 MG TABS tablet Take 1 tablet (5 mg total) by mouth 2 (two) times daily. 180 tablet 3   Ascorbic Acid (VITAMIN C) 1000 MG tablet Take 1,000 mg by mouth daily.     b complex vitamins tablet Take 1 tablet by mouth at bedtime.     chlorpheniramine (CHLOR-TRIMETON) 4 MG tablet  Take 4 mg by mouth daily before breakfast.     Coenzyme Q10 (COQ10) 100 MG CAPS Take 300 mg by mouth daily after breakfast.     COVID-19 mRNA Vac-TriS, Pfizer, (PFIZER-BIONT COVID-19 VAC-TRIS) SUSP injection Inject into the muscle. 0.3 mL 0   Dextromethorphan-guaiFENesin (MUCINEX FAST-MAX DM MAX PO) Take 1 tablet by mouth 2 (two) times daily before a meal. 20-400 mg     fenofibrate (TRICOR) 145 MG tablet Take 145 mg by mouth at bedtime.     ferrous sulfate 325 (65 FE) MG tablet Take 325 mg by mouth 3 (three) times a week.     GLUCOSAMINE-CHONDROITIN PO Take 2 capsules by mouth daily after breakfast. 600/1200 mg     Lactobacillus (ACIDOPHILUS) CAPS Take 2 capsules by mouth 3 (three) times daily. 5-8 minutes before each meal with 1/2 cup of caffeine coffee Probiotic 100 Million     Lutein 20 MG TABS Take 20 mg by mouth daily after breakfast.     Niacin (NICOTINIC ACID PO) Take 250 mg by mouth 2 (two) times daily after a meal. "DO NOT GIVE NIACIN"     repaglinide (PRANDIN) 2 MG tablet Take 2 mg by mouth 3 (three) times daily before meals. 15 minutes before each meal     simvastatin (ZOCOR) 10 MG tablet Take 10 mg by mouth See  admin instructions. Must take after supper "Must Have AURA-Binda Brand " made in [place] ONLY 30 tablet 6   SODIUM BICARBONATE PO Take 162-165 mg by mouth at bedtime.     trandolapril-verapamil (TARKA) 4-240 MG per tablet Take 0.5 tablets by mouth at bedtime.     UNABLE TO FIND Take 20 mg by mouth daily after breakfast. Zeaxanthin     VITAMIN A PO Take 2,300 mcg by mouth daily after breakfast.     Vitamin D3 (VITAMIN D) 25 MCG tablet Take 1,000 Units by mouth every other day.     No current facility-administered medications for this visit.    Allergies  Allergen Reactions   Other Other (See Comments)    Other reaction(s): Cough, Other (See Comments) Freshly sprayed hairspray causes coughing, sneezing, and watery eyes. HAIR SPRAY CAUSES SNEEZING, WATERY EYES AND  COUGH   Do not eat Red Beef and Popcorn    Chicken Meat (Diagnostic) Other (See Comments)    Maceo Pro / Can eat on if cooked in non cholesterol oil   Eggs Or Egg-Derived Products Other (See Comments)    Yoke    Review of Systems negative except from HPI and PMH  Physical Exam There were no vitals taken for this visit.  Well developed and well nourished in no acute distress HENT normal Neck supple with JVP-flat Clear Device pocket well healed; without hematoma or erythema.  There is no tethering  Regular rate and rhythm, no *** gallop No ***/*** murmur Abd-soft with active BS No Clubbing cyanosis *** edema Skin-warm and dry A & Oriented  Grossly normal sensory and motor function  ECG ***     Assessment and  Plan  Complete heart block- intermittent .   Pacemaker: St. Jude       Hypertension well-controlled   Atrial fib-SCAF now greater than 2 days  PVCs  Frequent   COPD  ***

## 2021-05-30 ENCOUNTER — Other Ambulatory Visit: Payer: Self-pay

## 2021-05-30 ENCOUNTER — Ambulatory Visit (INDEPENDENT_AMBULATORY_CARE_PROVIDER_SITE_OTHER): Payer: Medicare Other | Admitting: Internal Medicine

## 2021-05-30 ENCOUNTER — Encounter: Payer: Self-pay | Admitting: Internal Medicine

## 2021-05-30 VITALS — BP 98/56 | HR 70 | Ht 72.0 in | Wt 170.4 lb

## 2021-05-30 DIAGNOSIS — I480 Paroxysmal atrial fibrillation: Secondary | ICD-10-CM

## 2021-05-30 DIAGNOSIS — I442 Atrioventricular block, complete: Secondary | ICD-10-CM

## 2021-05-30 DIAGNOSIS — Z950 Presence of cardiac pacemaker: Secondary | ICD-10-CM

## 2021-05-30 LAB — CUP PACEART INCLINIC DEVICE CHECK
Battery Remaining Longevity: 114 mo
Battery Voltage: 3.01 V
Brady Statistic RA Percent Paced: 56 %
Brady Statistic RV Percent Paced: 98 %
Date Time Interrogation Session: 20221026163355
Implantable Lead Implant Date: 20111128
Implantable Lead Implant Date: 20111128
Implantable Lead Location: 753859
Implantable Lead Location: 753860
Implantable Pulse Generator Implant Date: 20220722
Lead Channel Impedance Value: 425 Ohm
Lead Channel Impedance Value: 525 Ohm
Lead Channel Pacing Threshold Amplitude: 1 V
Lead Channel Pacing Threshold Amplitude: 1 V
Lead Channel Pacing Threshold Pulse Width: 0.4 ms
Lead Channel Pacing Threshold Pulse Width: 0.4 ms
Lead Channel Sensing Intrinsic Amplitude: 1.5 mV
Lead Channel Setting Pacing Amplitude: 1.375
Lead Channel Setting Pacing Amplitude: 1.625
Lead Channel Setting Pacing Pulse Width: 0.4 ms
Lead Channel Setting Sensing Sensitivity: 2.5 mV
Pulse Gen Model: 2272
Pulse Gen Serial Number: 3936953

## 2021-05-30 NOTE — Patient Instructions (Addendum)
Medication Instructions:  Your physician recommends that you continue on your current medications as directed. Please refer to the Current Medication list given to you today.  *If you need a refill on your cardiac medications before your next appointment, please call your pharmacy*   Lab Work: None ordered.  If you have labs (blood work) drawn today and your tests are completely normal, you will receive your results only by: [place] (if you have MyChart) OR A paper copy in the mail If you have any lab test that is abnormal or we need to change your treatment, we will call you to review the results.   Testing/Procedures: None ordered.    Follow-Up: At [facility], you and your health needs are our priority.  As part of our continuing mission to provide you with exceptional heart care, we have created designated Provider Care Teams.  These Care Teams include your primary Cardiologist (physician) and Advanced Practice Providers (APPs -  Physician Assistants and Nurse Practitioners) who all work together to provide you with the care you need, when you need it.  We recommend signing up for the patient portal called "MyChart".  Sign up information is provided on this After Visit Summary.  MyChart is used to connect with patients for Virtual Visits (Telemedicine).  Patients are able to view lab/test results, encounter notes, upcoming appointments, etc.  Non-urgent messages can be sent to your provider as well.   To learn more about what you can do with MyChart, go to [email or web address].    Your next appointment:   3 - 4 months  The format for your next appointment:   In Person  Provider:   Virl Axe, MD  Dr Olin Pia scheduler will contact you with appointment

## 2021-05-30 NOTE — Progress Notes (Signed)
Patient Care Team: Patient, No Pcp Per (Inactive) as PCP - General (General Practice) Deboraha Sprang, MD as PCP - Electrophysiology (Cardiology)   HPI  Gerald Dove Koua Deeg. is a 87 y.o. male seen following pacemaker implantation for complete heart block and syncope in  December 2012.  -  Echocardiogram November 2011 demonstrated normal left ventricular function without wall motion abnormalities. It mildly elevated pulmonary pressures  Chronically short of breath.  Mostly in a chair.  Exercises little, walks little.  Limited mostly by his hip.  Sleeping a lot.  Complaints related to the removal of his adhesive at the time of his wound check.  Date Cr K Hgb  10/21 1.39      7/22 1.15 4.4 14.2      Past Medical History:  Diagnosis Date   CA - cancer    Right Axillary Squamous Cell   Cataracts, bilateral    CHB (complete heart block) ([place]) nov 2011   CKD (chronic kidney disease), stage II    COPD (chronic obstructive pulmonary disease) (HCC)    Diabetes mellitus    DVT of upper extremity (deep vein thrombosis) ([place])    post pacer   Hyperlipidemia    Hypertension    Macular degeneration    Pacemaker    st judes    Past Surgical History:  Procedure Laterality Date   PACEMAKER PLACEMENT  Jul 02 2010   [facility] GENERATOR CHANGEOUT N/A 02/23/2021   Procedure: PPM GENERATOR CHANGEOUT;  Surgeon: Deboraha Sprang, MD;  Location: [place] [facility];  Service: Cardiovascular;  Laterality: N/A;   SQUAMOUS CELL CARCINOMA EXCISION     and XRT   xrt      Current Outpatient Medications  Medication Sig Dispense Refill   acetaminophen (TYLENOL) 500 MG tablet Take 1,000 mg by mouth daily before breakfast. Caplets     apixaban (ELIQUIS) 5 MG TABS tablet Take 1 tablet (5 mg total) by mouth 2 (two) times daily. 180 tablet 3   Ascorbic Acid (VITAMIN C) 1000 MG tablet Take 1,000 mg by mouth daily.     b complex vitamins tablet Take 1 tablet by mouth at bedtime.     chlorpheniramine  (CHLOR-TRIMETON) 4 MG tablet Take 4 mg by mouth daily before breakfast.     Coenzyme Q10 (COQ10) 100 MG CAPS Take 300 mg by mouth daily after breakfast.     COVID-19 mRNA Vac-TriS, Pfizer, (PFIZER-BIONT COVID-19 VAC-TRIS) SUSP injection Inject into the muscle. 0.3 mL 0   Dextromethorphan-guaiFENesin (MUCINEX FAST-MAX DM MAX PO) Take 1 tablet by mouth 2 (two) times daily before a meal. 20-400 mg     fenofibrate (TRICOR) 145 MG tablet Take 145 mg by mouth at bedtime.     ferrous sulfate 325 (65 FE) MG tablet Take 325 mg by mouth 3 (three) times a week.     GLUCOSAMINE-CHONDROITIN PO Take 2 capsules by mouth daily after breakfast. 600/1200 mg     Lactobacillus (ACIDOPHILUS) CAPS Take 2 capsules by mouth 3 (three) times daily. 5-8 minutes before each meal with 1/2 cup of caffeine coffee Probiotic 100 Million     Lutein 20 MG TABS Take 20 mg by mouth daily after breakfast.     Niacin (NICOTINIC ACID PO) Take 250 mg by mouth 2 (two) times daily after a meal. "DO NOT GIVE NIACIN"     repaglinide (PRANDIN) 2 MG tablet Take 2 mg by mouth 3 (three) times daily before meals. 15 minutes before  each meal     simvastatin (ZOCOR) 10 MG tablet Take 10 mg by mouth See admin instructions. Must take after supper "Must Have AURA-Binda Brand " made in [place] ONLY 30 tablet 6   SODIUM BICARBONATE PO Take 162-165 mg by mouth at bedtime.     trandolapril-verapamil (TARKA) 4-240 MG per tablet Take 0.5 tablets by mouth at bedtime.     UNABLE TO FIND Take 20 mg by mouth daily after breakfast. Zeaxanthin     VITAMIN A PO Take 2,300 mcg by mouth daily after breakfast.     Vitamin D3 (VITAMIN D) 25 MCG tablet Take 1,000 Units by mouth every other day.     No current facility-administered medications for this visit.    Allergies  Allergen Reactions   Other Other (See Comments)    Other reaction(s): Cough, Other (See Comments) Freshly sprayed hairspray causes coughing, sneezing, and watery eyes. HAIR SPRAY CAUSES  SNEEZING, WATERY EYES AND COUGH   Do not eat Red Beef and Popcorn    Chicken Meat (Diagnostic) Other (See Comments)    Maceo Pro / Can eat on if cooked in non cholesterol oil   Eggs Or Egg-Derived Products Other (See Comments)    Yoke    Review of Systems negative except from HPI and PMH  Physical Exam BP (!) 98/56   Pulse 70   Ht 6' (1.829 m)   Wt 170 lb 6.4 oz (77.3 kg)   SpO2 97%   BMI 23.11 kg/m  Well developed and well nourished in no acute distress HENT normal Neck supple  Distnant  Device pocket well healed; without hematoma or erythema.  There is no tethering  Regular rate and rhythm, no  gallop No  murmur Abd-soft with active BS No Clubbing cyanosis  edema atrophic right hand Skin-warm and dry A & Oriented  Grossly normal sensory and motor function  ECG atrial fibrillation with underlying ventricular pacing at 70 -/12/38    Assessment and  Plan  Complete heart block- intermittent .   Pacemaker: St. Jude       Hypertension well-controlled   Atrial fib-persistent  PVCs  Frequent   COPD    Persistent atrial fibrillation.  Continue Eliquis at 5 mg twice daily.  Rate is not an issue.  Hypertension.  Right now we will continue to trandolapril/verapamil 2/120--if blood pressure continues to be low we will have to stop this.  Pain at device site but no evidence of infection.  No tethering although he does note some thinning

## 2021-06-05 NOTE — Progress Notes (Signed)
Remote pacemaker transmission.   

## 2021-08-28 ENCOUNTER — Ambulatory Visit (INDEPENDENT_AMBULATORY_CARE_PROVIDER_SITE_OTHER): Payer: Medicare Other

## 2021-08-28 DIAGNOSIS — Z950 Presence of cardiac pacemaker: Secondary | ICD-10-CM | POA: Diagnosis not present

## 2021-08-28 DIAGNOSIS — I442 Atrioventricular block, complete: Secondary | ICD-10-CM

## 2021-08-28 LAB — CUP PACEART REMOTE DEVICE CHECK
Battery Remaining Longevity: 115 mo
Battery Remaining Percentage: 95.5 %
Battery Voltage: 3.01 V
Brady Statistic AP VP Percent: 52 %
Brady Statistic AP VS Percent: 1 %
Brady Statistic AS VP Percent: 45 %
Brady Statistic AS VS Percent: 1 %
Brady Statistic RA Percent Paced: 38 %
Brady Statistic RV Percent Paced: 97 %
Date Time Interrogation Session: 20230123020027
Implantable Lead Implant Date: 20111128
Implantable Lead Implant Date: 20111128
Implantable Lead Location: 753859
Implantable Lead Location: 753860
Implantable Pulse Generator Implant Date: 20220722
Lead Channel Impedance Value: 400 Ohm
Lead Channel Impedance Value: 490 Ohm
Lead Channel Pacing Threshold Amplitude: 0.875 V
Lead Channel Pacing Threshold Amplitude: 1.125 V
Lead Channel Pacing Threshold Pulse Width: 0.4 ms
Lead Channel Pacing Threshold Pulse Width: 0.4 ms
Lead Channel Sensing Intrinsic Amplitude: 12 mV
Lead Channel Sensing Intrinsic Amplitude: 2.9 mV
Lead Channel Setting Pacing Amplitude: 1.375
Lead Channel Setting Pacing Amplitude: 1.875
Lead Channel Setting Pacing Pulse Width: 0.4 ms
Lead Channel Setting Sensing Sensitivity: 2.5 mV
Pulse Gen Model: 2272
Pulse Gen Serial Number: 3936953

## 2021-09-06 ENCOUNTER — Encounter: Payer: Self-pay | Admitting: Internal Medicine

## 2021-09-06 ENCOUNTER — Other Ambulatory Visit: Payer: Self-pay

## 2021-09-06 ENCOUNTER — Ambulatory Visit: Payer: Medicare Other | Admitting: Internal Medicine

## 2021-09-06 ENCOUNTER — Ambulatory Visit (INDEPENDENT_AMBULATORY_CARE_PROVIDER_SITE_OTHER): Payer: Medicare Other | Admitting: Internal Medicine

## 2021-09-06 VITALS — BP 162/84 | HR 82 | Ht 72.0 in

## 2021-09-06 DIAGNOSIS — Z950 Presence of cardiac pacemaker: Secondary | ICD-10-CM

## 2021-09-06 DIAGNOSIS — I493 Ventricular premature depolarization: Secondary | ICD-10-CM | POA: Diagnosis not present

## 2021-09-06 DIAGNOSIS — I442 Atrioventricular block, complete: Secondary | ICD-10-CM

## 2021-09-06 DIAGNOSIS — I4819 Other persistent atrial fibrillation: Secondary | ICD-10-CM

## 2021-09-06 NOTE — Progress Notes (Signed)
Patient Care Team: Patient, No Pcp Per (Inactive) as PCP - General (General Practice) Deboraha Sprang, MD as PCP - Electrophysiology (Cardiology)   HPI  Gerald Hardin. is a 88 y.o. male seen following pacemaker implantation for complete heart block and syncope in  December 2012; Gen change 7/22- some concerns thereafter of pain but without evidence of infection 10/22  Echocardiogram November 2011 demonstrated normal left ventricular function without wall motion abnormalities. It mildly elevated pulmonary pressures  Chronically short of breath  fleeting chest pains--no edema  palps syncope   Date Cr K Hgb  10/21 1.39      7/22 1.15 4.4 14.2      Past Medical History:  Diagnosis Date   CA - cancer    Right Axillary Squamous Cell   Cataracts, bilateral    CHB (complete heart block) ([place]) nov 2011   CKD (chronic kidney disease), stage II    COPD (chronic obstructive pulmonary disease) (HCC)    Diabetes mellitus    DVT of upper extremity (deep vein thrombosis) ([place])    post pacer   Hyperlipidemia    Hypertension    Macular degeneration    Pacemaker    st judes    Past Surgical History:  Procedure Laterality Date   PACEMAKER PLACEMENT  Jul 02 2010   [facility] GENERATOR CHANGEOUT N/A 02/23/2021   Procedure: PPM GENERATOR CHANGEOUT;  Surgeon: Deboraha Sprang, MD;  Location: [place] [facility];  Service: Cardiovascular;  Laterality: N/A;   SQUAMOUS CELL CARCINOMA EXCISION     and XRT   xrt      Current Outpatient Medications  Medication Sig Dispense Refill   acetaminophen (TYLENOL) 500 MG tablet Take 1,000 mg by mouth daily before breakfast. Caplets     apixaban (ELIQUIS) 5 MG TABS tablet Take 1 tablet (5 mg total) by mouth 2 (two) times daily. 180 tablet 3   Ascorbic Acid (VITAMIN C) 1000 MG tablet Take 1,000 mg by mouth daily.     b complex vitamins tablet Take 1 tablet by mouth at bedtime.     chlorpheniramine (CHLOR-TRIMETON) 4 MG tablet Take 4 mg by mouth daily  before breakfast.     Coenzyme Q10 (COQ10) 100 MG CAPS Take 300 mg by mouth daily after breakfast.     COVID-19 mRNA Vac-TriS, Pfizer, (PFIZER-BIONT COVID-19 VAC-TRIS) SUSP injection Inject into the muscle. 0.3 mL 0   Dextromethorphan-guaiFENesin (MUCINEX FAST-MAX DM MAX PO) Take 1 tablet by mouth 2 (two) times daily before a meal. 20-400 mg     fenofibrate (TRICOR) 145 MG tablet Take 145 mg by mouth at bedtime.     ferrous sulfate 325 (65 FE) MG tablet Take 325 mg by mouth 3 (three) times a week.     GLUCOSAMINE-CHONDROITIN PO Take 2 capsules by mouth daily after breakfast. 600/1200 mg     Lactobacillus (ACIDOPHILUS) CAPS Take 2 capsules by mouth 3 (three) times daily. 5-8 minutes before each meal with 1/2 cup of caffeine coffee Probiotic 100 Million     Lutein 20 MG TABS Take 20 mg by mouth daily after breakfast.     Niacin (NICOTINIC ACID PO) Take 250 mg by mouth 2 (two) times daily after a meal. "DO NOT GIVE NIACIN"     repaglinide (PRANDIN) 2 MG tablet Take 2 mg by mouth 3 (three) times daily before meals. 15 minutes before each meal     simvastatin (ZOCOR) 10 MG tablet Take 10 mg by  mouth See admin instructions. Must take after supper "Must Have AURA-Binda Brand " made in [place] ONLY 30 tablet 6   SODIUM BICARBONATE PO Take 162-165 mg by mouth at bedtime.     trandolapril-verapamil (TARKA) 4-240 MG per tablet Take 0.5 tablets by mouth at bedtime.     UNABLE TO FIND Take 20 mg by mouth daily after breakfast. Zeaxanthin     VITAMIN A PO Take 2,300 mcg by mouth daily after breakfast.     Vitamin D3 (VITAMIN D) 25 MCG tablet Take 1,000 Units by mouth every other day.     No current facility-administered medications for this visit.    Allergies  Allergen Reactions   Other Other (See Comments)    Other reaction(s): Cough, Other (See Comments) Freshly sprayed hairspray causes coughing, sneezing, and watery eyes. HAIR SPRAY CAUSES SNEEZING, WATERY EYES AND COUGH   Do not eat Red Beef and  Popcorn    Chicken Meat (Diagnostic) Other (See Comments)    Maceo Pro / Can eat on if cooked in non cholesterol oil   Eggs Or Egg-Derived Products Other (See Comments)    Yoke    Review of Systems negative except from HPI and PMH  Physical Exam BP (!) 162/84 (BP Location: Left Arm, Patient Position: Sitting, Cuff Size: Small)    Pulse 82    Ht 6' (1.829 m)    BMI 23.11 kg/m  Well developed and well nourished in no acute distress HENT normal Neck supple with JVP-flat Clear decreased on R  Device pocket well healed; without hematoma or erythema.  There is no tethering  Regular rate and rhythm, no  gallop No / murmur Abd-soft with active BS No Clubbing cyanosis  edema Skin-warm and dry A & Oriented     ECG AV pacing     Assessment and  Plan  Complete heart block- intermittent .   Pacemaker: St. Jude       Hypertension well-controlled   Atrial fib-persistent  PVCs  Frequent   COPD   Persistent afib now in sinus  continue anticoagulation with apixaban 5 bid  BP elevated  continue tranodpril verapamil 120/2 and will ask him to try and begin Home BP monitoring   PVCs quiescient

## 2021-09-06 NOTE — Patient Instructions (Signed)
Medication Instructions:  Your physician recommends that you continue on your current medications as directed. Please refer to the Current Medication list given to you today.  *If you need a refill on your cardiac medications before your next appointment, please call your pharmacy*   Lab Work: None ordered.  If you have labs (blood work) drawn today and your tests are completely normal, you will receive your results only by: [place] (if you have MyChart) OR A paper copy in the mail If you have any lab test that is abnormal or we need to change your treatment, we will call you to review the results.   Testing/Procedures: None ordered.    Follow-Up: At [facility], you and your health needs are our priority.  As part of our continuing mission to provide you with exceptional heart care, we have created designated Provider Care Teams.  These Care Teams include your primary Cardiologist (physician) and Advanced Practice Providers (APPs -  Physician Assistants and Nurse Practitioners) who all work together to provide you with the care you need, when you need it.  We recommend signing up for the patient portal called "MyChart".  Sign up information is provided on this After Visit Summary.  MyChart is used to connect with patients for Virtual Visits (Telemedicine).  Patients are able to view lab/test results, encounter notes, upcoming appointments, etc.  Non-urgent messages can be sent to your provider as well.   To learn more about what you can do with MyChart, go to [email or web address].    Your next appointment:   6 months with Dr Olin Pia Carin Hock or Joseph Art Dr Caryl Comes in 18 months

## 2021-09-07 NOTE — Progress Notes (Signed)
Remote pacemaker transmission.   

## 2021-09-11 NOTE — Addendum Note (Signed)
Addended by: Janan Halter F on: 09/11/2021 02:16 PM   Modules accepted: Orders

## 2021-11-26 ENCOUNTER — Ambulatory Visit (INDEPENDENT_AMBULATORY_CARE_PROVIDER_SITE_OTHER): Payer: Medicare Other

## 2021-11-26 DIAGNOSIS — I442 Atrioventricular block, complete: Secondary | ICD-10-CM

## 2021-11-27 LAB — CUP PACEART REMOTE DEVICE CHECK
Battery Remaining Longevity: 113 mo
Battery Remaining Percentage: 95 %
Battery Voltage: 2.99 V
Brady Statistic AP VP Percent: 51 %
Brady Statistic AP VS Percent: 1 %
Brady Statistic AS VP Percent: 46 %
Brady Statistic AS VS Percent: 1 %
Brady Statistic RA Percent Paced: 32 %
Brady Statistic RV Percent Paced: 97 %
Date Time Interrogation Session: 20230425100845
Implantable Lead Implant Date: 20111128
Implantable Lead Implant Date: 20111128
Implantable Lead Location: 753859
Implantable Lead Location: 753860
Implantable Pulse Generator Implant Date: 20220722
Lead Channel Impedance Value: 400 Ohm
Lead Channel Impedance Value: 490 Ohm
Lead Channel Pacing Threshold Amplitude: 0.75 V
Lead Channel Pacing Threshold Amplitude: 1 V
Lead Channel Pacing Threshold Pulse Width: 0.4 ms
Lead Channel Pacing Threshold Pulse Width: 0.4 ms
Lead Channel Sensing Intrinsic Amplitude: 1.5 mV
Lead Channel Sensing Intrinsic Amplitude: 10.5 mV
Lead Channel Setting Pacing Amplitude: 1.25 V
Lead Channel Setting Pacing Amplitude: 1.75 V
Lead Channel Setting Pacing Pulse Width: 0.4 ms
Lead Channel Setting Sensing Sensitivity: 2.5 mV
Pulse Gen Model: 2272
Pulse Gen Serial Number: 3936953

## 2021-12-12 NOTE — Progress Notes (Signed)
Remote pacemaker transmission.   

## 2022-03-01 NOTE — Progress Notes (Deleted)
Electrophysiology Office Note Date: 03/01/2022  ID:  Gerald Birkenhead., DOB 11-02-1933, MRN [id number]  PCP: Patient, No Pcp Per Primary Cardiologist: None Electrophysiologist: Virl Axe, MD   CC: Pacemaker follow-up  Gerald Ayre. is a 88 y.o. male seen today for Virl Axe, MD for routine electrophysiology followup.  Since last being seen in our clinic the patient reports doing ***.  he denies chest pain, palpitations, dyspnea, PND, orthopnea, nausea, vomiting, dizziness, syncope, edema, weight gain, or early satiety.  Device History: St. Jude Dual Chamber PPM implanted 2011 for CHB  Past Medical History:  Diagnosis Date   CA - cancer    Right Axillary Squamous Cell   Cataracts, bilateral    CHB (complete heart block) ([place]) nov 2011   CKD (chronic kidney disease), stage II    COPD (chronic obstructive pulmonary disease) ([place])    Diabetes mellitus    DVT of upper extremity (deep vein thrombosis) ([place])    post pacer   Hyperlipidemia    Hypertension    Macular degeneration    Pacemaker    st judes   Past Surgical History:  Procedure Laterality Date   PACEMAKER PLACEMENT  Jul 02 2010   [facility] GENERATOR CHANGEOUT N/A 02/23/2021   Procedure: PPM GENERATOR CHANGEOUT;  Surgeon: Deboraha Sprang, MD;  Location: [place] [facility];  Service: Cardiovascular;  Laterality: N/A;   SQUAMOUS CELL CARCINOMA EXCISION     and XRT   xrt      Current Outpatient Medications  Medication Sig Dispense Refill   acetaminophen (TYLENOL) 500 MG tablet Take 1,000 mg by mouth daily before breakfast. Caplets     apixaban (ELIQUIS) 5 MG TABS tablet Take 1 tablet (5 mg total) by mouth 2 (two) times daily. 180 tablet 3   Ascorbic Acid (VITAMIN C) 1000 MG tablet Take 1,000 mg by mouth daily.     b complex vitamins tablet Take 1 tablet by mouth at bedtime.     chlorpheniramine (CHLOR-TRIMETON) 4 MG tablet Take 4 mg by mouth daily before breakfast.     Coenzyme Q10 (COQ10) 100 MG CAPS Take 300  mg by mouth daily after breakfast.     COVID-19 mRNA Vac-TriS, Pfizer, (PFIZER-BIONT COVID-19 VAC-TRIS) SUSP injection Inject into the muscle. 0.3 mL 0   Dextromethorphan-guaiFENesin (MUCINEX FAST-MAX DM MAX PO) Take 1 tablet by mouth 2 (two) times daily before a meal. 20-400 mg     fenofibrate (TRICOR) 145 MG tablet Take 145 mg by mouth at bedtime.     ferrous sulfate 325 (65 FE) MG tablet Take 325 mg by mouth 3 (three) times a week.     GLUCOSAMINE-CHONDROITIN PO Take 2 capsules by mouth daily after breakfast. 600/1200 mg     Lactobacillus (ACIDOPHILUS) CAPS Take 2 capsules by mouth 3 (three) times daily. 5-8 minutes before each meal with 1/2 cup of caffeine coffee Probiotic 100 Million     Lutein 20 MG TABS Take 20 mg by mouth daily after breakfast.     Niacin (NICOTINIC ACID PO) Take 250 mg by mouth 2 (two) times daily after a meal. "DO NOT GIVE NIACIN"     repaglinide (PRANDIN) 2 MG tablet Take 2 mg by mouth 3 (three) times daily before meals. 15 minutes before each meal     simvastatin (ZOCOR) 10 MG tablet Take 10 mg by mouth See admin instructions. Must take after supper "Must Have AURA-Binda Brand " made in [place] ONLY 30 tablet 6  SODIUM BICARBONATE PO Take 162-165 mg by mouth at bedtime.     trandolapril-verapamil (TARKA) 4-240 MG per tablet Take 0.5 tablets by mouth at bedtime.     UNABLE TO FIND Take 20 mg by mouth daily after breakfast. Zeaxanthin     VITAMIN A PO Take 2,300 mcg by mouth daily after breakfast.     Vitamin D3 (VITAMIN D) 25 MCG tablet Take 1,000 Units by mouth every other day.     No current facility-administered medications for this visit.    Allergies:   Other, Chicken meat (diagnostic), and Eggs or egg-derived products   Social History: Social History   Socioeconomic History   Marital status: Married    Spouse name: Not on file   Number of children: Not on file   Years of education: Not on file   Highest education level: Not on file  Occupational  History   Not on file  Tobacco Use   Smoking status: Smoker, Current Status Unknown    Packs/day: 1.00    Types: Cigarettes   Smokeless tobacco: Never  Substance and Sexual Activity   Alcohol use: No   Drug use: No   Sexual activity: Not on file  Other Topics Concern   Not on file  Social History Narrative   Not on file   Social Determinants of Health   Financial Resource Strain: Not on file  Food Insecurity: Not on file  Transportation Needs: Not on file  Physical Activity: Not on file  Stress: Not on file  Social Connections: Not on file  Intimate Partner Violence: Not on file    Family History: Family History  Problem Relation Age of Onset   Heart failure Father      Review of Systems: All other systems reviewed and are otherwise negative except as noted above.  Physical Exam: There were no vitals filed for this visit.   GEN- The patient is well appearing, alert and oriented x 3 today.   HEENT: normocephalic, atraumatic; sclera clear, conjunctiva pink; hearing intact; oropharynx clear; neck supple  Lungs- Clear to ausculation bilaterally, normal work of breathing.  No wheezes, rales, rhonchi Heart- Regular rate and rhythm, no murmurs, rubs or gallops  GI- soft, non-tender, non-distended, bowel sounds present  Extremities- no clubbing or cyanosis. No edema MS- no significant deformity or atrophy Skin- warm and dry, no rash or lesion; PPM pocket well healed Psych- euthymic mood, full affect Neuro- strength and sensation are intact  PPM Interrogation- reviewed in detail today,  See PACEART report  EKG:  EKG is not ordered today. Personal review of ekg ordered  09/11/2021  shows ***   Recent Labs: No results found for requested labs within last 365 days.   Wt Readings from Last 3 Encounters:  05/30/21 170 lb 6.4 oz (77.3 kg)  02/23/21 170 lb (77.1 kg)  01/09/21 169 lb (76.7 kg)     Other studies Reviewed: Additional studies/ records that were reviewed  today include: Previous EP office notes, Previous remote checks, Most recent labwork.   Assessment and Plan:  1. CHB s/p St. Jude PPM  Normal PPM function See Pace Art report No changes today  2. HTN Stable on current regimen   Paroxysmal Atrial Fibrillation CHA2DS2VASc  of at least 4. Continue Eliquis 5 mg BID   Current medicines are reviewed at length with the patient today.    Labs/ tests ordered today include: *** No orders of the defined types were placed in this encounter.  Disposition:   Follow up with Dr. Caryl Comes in {Blank single:19197::"2 weeks","4 weeks","3 months","6 months","12 months","as usual post gen change"}    Signed, Shirley Friar, PA-C  03/01/2022 2:01 PM  [facility] [address] [place]  [address] [phone] (office) [phone] (fax)

## 2022-03-05 ENCOUNTER — Encounter: Payer: Medicare Other | Admitting: Student

## 2022-03-05 DIAGNOSIS — I480 Paroxysmal atrial fibrillation: Secondary | ICD-10-CM

## 2022-03-05 DIAGNOSIS — I10 Essential (primary) hypertension: Secondary | ICD-10-CM

## 2022-03-05 DIAGNOSIS — I4819 Other persistent atrial fibrillation: Secondary | ICD-10-CM

## 2022-03-05 DIAGNOSIS — I442 Atrioventricular block, complete: Secondary | ICD-10-CM

## 2022-03-10 NOTE — Progress Notes (Signed)
Cardiology Office Note Date:  03/10/2022  Patient ID:  Gerald Hardin., DOB 1934/04/29, MRN [id number] PCP:  Patient, No Pcp Per  Electrophysiologist: Dr. Caryl Comes    Chief Complaint:  6 mo  History of Present Illness: Gerald Hardin. is a 88 y.o. male with history of CHB w/PPM, HTN, HLD, DM, COPD, CKD (II-III), AFib, PVCs  He comes in today to be seen for Dr. Caryl Comes, last seen by him Feb 2023, BP was up some and asked to monitor this at home. No changes were made.  TODAY He is accompanied by his wife He is very tangential but denies any cardiac concerns. He continues to smoke with no intention of smoking Reports some DOE that he attributes to his age more then his smoking. He reports a sharp fleeting far left lateral chest pain that happens once every 6 mo that noeone for "decades" has been able to figure out. No CP or cardiac awareness otherwise. No near syncope or syncope. He comes in a wheelchair he says 2/2 terrible hip pain Denies bleeding or signs of bleeding  He has not seen a PMD in 3+ years No recent labs   Device information Abbott dual chamber PPM implanted 07/02/2010, gen change 02/23/2021   Past Medical History:  Diagnosis Date   CA - cancer    Right Axillary Squamous Cell   Cataracts, bilateral    CHB (complete heart block) ([place]) nov 2011   CKD (chronic kidney disease), stage II    COPD (chronic obstructive pulmonary disease) ([place])    Diabetes mellitus    DVT of upper extremity (deep vein thrombosis) ([place])    post pacer   Hyperlipidemia    Hypertension    Macular degeneration    Pacemaker    st judes    Past Surgical History:  Procedure Laterality Date   PACEMAKER PLACEMENT  Jul 02 2010   [facility] GENERATOR CHANGEOUT N/A 02/23/2021   Procedure: PPM GENERATOR CHANGEOUT;  Surgeon: Deboraha Sprang, MD;  Location: [place] [facility];  Service: Cardiovascular;  Laterality: N/A;   SQUAMOUS CELL CARCINOMA EXCISION     and XRT   xrt      Current  Outpatient Medications  Medication Sig Dispense Refill   acetaminophen (TYLENOL) 500 MG tablet Take 1,000 mg by mouth daily before breakfast. Caplets     apixaban (ELIQUIS) 5 MG TABS tablet Take 1 tablet (5 mg total) by mouth 2 (two) times daily. 180 tablet 3   Ascorbic Acid (VITAMIN C) 1000 MG tablet Take 1,000 mg by mouth daily.     b complex vitamins tablet Take 1 tablet by mouth at bedtime.     chlorpheniramine (CHLOR-TRIMETON) 4 MG tablet Take 4 mg by mouth daily before breakfast.     Coenzyme Q10 (COQ10) 100 MG CAPS Take 300 mg by mouth daily after breakfast.     COVID-19 mRNA Vac-TriS, Pfizer, (PFIZER-BIONT COVID-19 VAC-TRIS) SUSP injection Inject into the muscle. 0.3 mL 0   Dextromethorphan-guaiFENesin (MUCINEX FAST-MAX DM MAX PO) Take 1 tablet by mouth 2 (two) times daily before a meal. 20-400 mg     fenofibrate (TRICOR) 145 MG tablet Take 145 mg by mouth at bedtime.     ferrous sulfate 325 (65 FE) MG tablet Take 325 mg by mouth 3 (three) times a week.     GLUCOSAMINE-CHONDROITIN PO Take 2 capsules by mouth daily after breakfast. 600/1200 mg     Lactobacillus (ACIDOPHILUS) CAPS Take 2 capsules by mouth 3 (  three) times daily. 5-8 minutes before each meal with 1/2 cup of caffeine coffee Probiotic 100 Million     Lutein 20 MG TABS Take 20 mg by mouth daily after breakfast.     Niacin (NICOTINIC ACID PO) Take 250 mg by mouth 2 (two) times daily after a meal. "DO NOT GIVE NIACIN"     repaglinide (PRANDIN) 2 MG tablet Take 2 mg by mouth 3 (three) times daily before meals. 15 minutes before each meal     simvastatin (ZOCOR) 10 MG tablet Take 10 mg by mouth See admin instructions. Must take after supper "Must Have AURA-Binda Brand " made in [place] ONLY 30 tablet 6   SODIUM BICARBONATE PO Take 162-165 mg by mouth at bedtime.     trandolapril-verapamil (TARKA) 4-240 MG per tablet Take 0.5 tablets by mouth at bedtime.     UNABLE TO FIND Take 20 mg by mouth daily after breakfast. Zeaxanthin      VITAMIN A PO Take 2,300 mcg by mouth daily after breakfast.     Vitamin D3 (VITAMIN D) 25 MCG tablet Take 1,000 Units by mouth every other day.     No current facility-administered medications for this visit.    Allergies:   Other, Chicken meat (diagnostic), and Eggs or egg-derived products   Social History:  The patient  reports that he has been smoking cigarettes. He has been smoking an average of 1 pack per day. He has never used smokeless tobacco. He reports that he does not drink alcohol and does not use drugs.   Family History:  The patient's family history includes Heart failure in his father.  ROS:  Please see the history of present illness.    All other systems are reviewed and otherwise negative.   PHYSICAL EXAM:  VS:  There were no vitals taken for this visit. BMI: There is no height or weight on file to calculate BMI. Well nourished, well developed, in no acute distress HEENT: normocephalic, atraumatic, poor and missing dentition Neck: no JVD, carotid bruits or masses Cardiac:  RRR; no significant murmurs, no rubs, or gallops Lungs:  CTA b/l, no wheezing, rhonchi or rales Abd: soft, nontender MS: no deformity or advanced atrophy Ext: no edema Skin: warm and dry, no rash Neuro:  No gross deficits appreciated Psych: euthymic mood, full affect  PPM site (R sided) is stable, no tethering or discomfort   EKG:  not done today  Device interrogation done today and reviewed by myself:  Battery and lead measurements are good 24% Afib burden Appears about his baseline No HVR episodes   07/01/2010: TTE Study Conclusions  - Left ventricle: The cavity size was normal. Wall thickness was     increased in a pattern of mild LVH. Systolic function was     vigorous. The estimated ejection fraction was in the range of 65%     to 70%. Wall motion was normal; there were no regional wall motion     abnormalities. Doppler parameters are consistent with abnormal     left  ventricular relaxation (grade 1 diastolic dysfunction).     Doppler parameters are consistent with high ventricular filling     pressure.   - Mitral valve: Calcified annulus.   - Pulmonary arteries: Systolic pressure was mildly to moderately     increased. PA peak pressure: 53m Hg (S).   Impressions:    - Mildly elevated velocity across LVOT suggests mild AS but visually     valve appears to open  well; technically difficult study but LV     function appears to be normal.   Recent Labs: No results found for requested labs within last 365 days.  No results found for requested labs within last 365 days.   CrCl cannot be calculated (Patient's most recent lab result is older than the maximum 21 days allowed.).   Wt Readings from Last 3 Encounters:  05/30/21 170 lb 6.4 oz (77.3 kg)  02/23/21 170 lb (77.1 kg)  01/09/21 169 lb (76.7 kg)     Other studies reviewed: Additional studies/records reviewed today include: summarized above  ASSESSMENT AND PLAN:  PPM Intact function No programming changes made   Persistent AFib CHA2DS2Vasc is 4, on eliquis, appropriately dosed 24 % burden 97% V paced  HTN Rechecked is 110/60 left and 106/60 right   Lengthy discussion on need for a local PMD, he apparently is from [place] and got stuck here because of COVID. This is quite unclear, but he was given information for PMD/[facility] Also discussed getting labs done Would like to do fasting and get his lipids done as well. He is pretty particular about getting his labs, he would prefer to come in another day. But wants to keep an eye on the weather foercast and plan a day with good weather that they can come in with and he will call to get them done/scheduled  Disposition: F/u with [place] in 107mootherwise, sooner if needed  Current medicines are reviewed at length with the patient today.  The patient did not have any concerns regarding medicines.  SVenetia Night  PA-C 03/10/2022 10:53 AM     [facility] [address] [place] [phone](office)  [phone](fax)

## 2022-03-12 ENCOUNTER — Ambulatory Visit (INDEPENDENT_AMBULATORY_CARE_PROVIDER_SITE_OTHER): Payer: Medicare Other | Admitting: Physician Assistant

## 2022-03-12 ENCOUNTER — Encounter: Payer: Self-pay | Admitting: Physician Assistant

## 2022-03-12 VITALS — BP 110/60 | HR 72 | Ht 72.0 in | Wt 161.2 lb

## 2022-03-12 DIAGNOSIS — I442 Atrioventricular block, complete: Secondary | ICD-10-CM

## 2022-03-12 DIAGNOSIS — E785 Hyperlipidemia, unspecified: Secondary | ICD-10-CM | POA: Diagnosis not present

## 2022-03-12 DIAGNOSIS — I4819 Other persistent atrial fibrillation: Secondary | ICD-10-CM

## 2022-03-12 DIAGNOSIS — Z950 Presence of cardiac pacemaker: Secondary | ICD-10-CM

## 2022-03-12 DIAGNOSIS — I10 Essential (primary) hypertension: Secondary | ICD-10-CM | POA: Diagnosis not present

## 2022-03-12 LAB — CUP PACEART INCLINIC DEVICE CHECK
Battery Remaining Longevity: 105 mo
Battery Voltage: 2.99 V
Brady Statistic RA Percent Paced: 44 %
Brady Statistic RV Percent Paced: 97 %
Date Time Interrogation Session: 20230808175259
Implantable Lead Implant Date: 20111128
Implantable Lead Implant Date: 20111128
Implantable Lead Location: 753859
Implantable Lead Location: 753860
Implantable Pulse Generator Implant Date: 20220722
Lead Channel Impedance Value: 412.5 Ohm
Lead Channel Impedance Value: 512.5 Ohm
Lead Channel Pacing Threshold Amplitude: 0.75 V
Lead Channel Pacing Threshold Amplitude: 1 V
Lead Channel Pacing Threshold Pulse Width: 0.4 ms
Lead Channel Pacing Threshold Pulse Width: 0.4 ms
Lead Channel Sensing Intrinsic Amplitude: 3.9 mV
Lead Channel Sensing Intrinsic Amplitude: 8.5 mV
Lead Channel Setting Pacing Amplitude: 1.25 V
Lead Channel Setting Pacing Amplitude: 1.75 V
Lead Channel Setting Pacing Pulse Width: 0.4 ms
Lead Channel Setting Sensing Sensitivity: 2.5 mV
Pulse Gen Model: 2272
Pulse Gen Serial Number: 3936953

## 2022-03-12 NOTE — Patient Instructions (Addendum)
Medication Instructions:  none *If you need a refill on your cardiac medications before your next appointment, please call your pharmacy*   Lab Work: Schedule Cmet, Lipids and CBC come back to the office fasting.  If you have labs (blood work) drawn today and your tests are completely normal, you will receive your results only by: [place] (if you have MyChart) OR A paper copy in the mail If you have any lab test that is abnormal or we need to change your treatment, we will call you to review the results.   Testing/Procedures: none   Follow-Up: At [facility], you and your health needs are our priority.  As part of our continuing mission to provide you with exceptional heart care, we have created designated Provider Care Teams.  These Care Teams include your primary Cardiologist (physician) and Advanced Practice Providers (APPs -  Physician Assistants and Nurse Practitioners) who all work together to provide you with the care you need, when you need it.  We recommend signing up for the patient portal called "MyChart".  Sign up information is provided on this After Visit Summary.  MyChart is used to connect with patients for Virtual Visits (Telemedicine).  Patients are able to view lab/test results, encounter notes, upcoming appointments, etc.  Non-urgent messages can be sent to your provider as well.   To learn more about what you can do with MyChart, go to [email or web address].    Your next appointment:   6 month(s)  The format for your next appointment:   In Person  Provider:   You may see Virl Axe, MD or one of the following Advanced Practice Providers on your designated Care Team:   Tommye Standard, PA-C}   Other Instructions You need to establish with a primary care provider: [place] phone number is [phone].   Important Information About Sugar

## 2022-03-22 ENCOUNTER — Other Ambulatory Visit: Payer: Self-pay | Admitting: Student

## 2022-08-07 ENCOUNTER — Ambulatory Visit: Payer: Medicare Other | Attending: Internal Medicine | Admitting: Internal Medicine

## 2022-08-07 ENCOUNTER — Encounter: Payer: Self-pay | Admitting: Internal Medicine

## 2022-08-07 VITALS — BP 124/66 | HR 66 | Ht 72.0 in

## 2022-08-07 DIAGNOSIS — Z950 Presence of cardiac pacemaker: Secondary | ICD-10-CM | POA: Diagnosis not present

## 2022-08-07 DIAGNOSIS — I442 Atrioventricular block, complete: Secondary | ICD-10-CM

## 2022-08-07 DIAGNOSIS — I4819 Other persistent atrial fibrillation: Secondary | ICD-10-CM | POA: Insufficient documentation

## 2022-08-07 MED ORDER — ISOSORBIDE MONONITRATE ER 30 MG PO TB24: 15.0000 mg | ORAL_TABLET | Freq: Every day | ORAL | 3 refills | Status: DC

## 2022-08-07 NOTE — Patient Instructions (Signed)
Medication Instructions:  Your physician has recommended you make the following change in your medication:   ** Start Imdur '30mg'$  - 1/2 tablet by mouth nightly.  *If you need a refill on your cardiac medications before your next appointment, please call your pharmacy*   Lab Work: None ordered.  If you have labs (blood work) drawn today and your tests are completely normal, you will receive your results only by: [place] (if you have MyChart) OR A paper copy in the mail If you have any lab test that is abnormal or we need to change your treatment, we will call you to review the results.   Testing/Procedures: None ordered.    Follow-Up: At [facility], you and your health needs are our priority.  As part of our continuing mission to provide you with exceptional heart care, we have created designated Provider Care Teams.  These Care Teams include your primary Cardiologist (physician) and Advanced Practice Providers (APPs -  Physician Assistants and Nurse Practitioners) who all work together to provide you with the care you need, when you need it.  We recommend signing up for the patient portal called "MyChart".  Sign up information is provided on this After Visit Summary.  MyChart is used to connect with patients for Virtual Visits (Telemedicine).  Patients are able to view lab/test results, encounter notes, upcoming appointments, etc.  Non-urgent messages can be sent to your provider as well.   To learn more about what you can do with MyChart, go to [email or web address].    Your next appointment:   6 months with Tommye Standard, PA-C  Important Information About Sugar

## 2022-08-07 NOTE — Progress Notes (Signed)
Patient Care Team: Patient, No Pcp Per as PCP - General (General Practice) Gerald Sprang, MD as PCP - Electrophysiology (Cardiology)   HPI  Gerald Hardin. is a 89 y.o. male seen following pacemaker implantation for complete heart block and syncope in  December 2012; Gen change 7/22- some concerns thereafter of pain but without evidence of infection 10/22  Echocardiogram November 2011 demonstrated normal left ventricular function without wall motion abnormalities. It mildly elevated pulmonary pressures ,The patient denies nocturnal dyspnea, orthopnea or peripheral edema.  There have been no   lightheadedness or syncope.  Complains of extremely limited exercise tolerance.  Partly orthopedic partly dyspneic.  Has had some chest discomfort, typically lasts a couple of minutes and then abates.  Mostly at night..  Also palpitations.  Some associated discomfort.  Intermittent.  Date Cr K Hgb  10/21 1.39      7/22 1.15 4.4 14.2      Past Medical History:  Diagnosis Date   CA - cancer    Right Axillary Squamous Cell   Cataracts, bilateral    CHB (complete heart block) ([place]) nov 2011   CKD (chronic kidney disease), stage II    COPD (chronic obstructive pulmonary disease) ([facility])    Diabetes mellitus    DVT of upper extremity (deep vein thrombosis) ([place])    post pacer   Hyperlipidemia    Hypertension    Macular degeneration    Pacemaker    st judes    Past Surgical History:  Procedure Laterality Date   PACEMAKER PLACEMENT  Jul 02 2010   [facility] GENERATOR CHANGEOUT N/A 02/23/2021   Procedure: PPM GENERATOR CHANGEOUT;  Surgeon: Gerald Sprang, MD;  Location: [place] [facility];  Service: Cardiovascular;  Laterality: N/A;   SQUAMOUS CELL CARCINOMA EXCISION     and XRT   xrt      Current Outpatient Medications  Medication Sig Dispense Refill   acetaminophen (TYLENOL) 500 MG tablet Take 1,000 mg by mouth daily before breakfast. Caplets     Ascorbic Acid (VITAMIN C) 1000 MG  tablet Take 1,000 mg by mouth daily.     b complex vitamins tablet Take 1 tablet by mouth at bedtime.     chlorpheniramine (CHLOR-TRIMETON) 4 MG tablet Take 4 mg by mouth daily before breakfast.     Coenzyme Q10 (COQ10) 100 MG CAPS Take 300 mg by mouth daily after breakfast.     COVID-19 mRNA Vac-TriS, Pfizer, (PFIZER-BIONT COVID-19 VAC-TRIS) SUSP injection Inject into the muscle. 0.3 mL 0   Dextromethorphan-guaiFENesin (MUCINEX FAST-MAX DM MAX PO) Take 1 tablet by mouth 2 (two) times daily before a meal. 20-400 mg     ELIQUIS 5 MG TABS tablet TAKE 1 TABLET BY MOUTH 2 TIMES DAILY 180 tablet 3   fenofibrate (TRICOR) 145 MG tablet Take 145 mg by mouth at bedtime.  1/2 tab daily     ferrous sulfate 325 (65 FE) MG tablet Take 325 mg by mouth 3 (three) times a week.     GLUCOSAMINE-CHONDROITIN PO Take 2 capsules by mouth daily after breakfast. 600/1200 mg     Lactobacillus (ACIDOPHILUS) CAPS Take 2 capsules by mouth 3 (three) times daily. 5-8 minutes before each meal with 1/2 cup of caffeine coffee Probiotic 100 Million     Lutein 20 MG TABS Take 20 mg by mouth daily after breakfast.     Niacin (NICOTINIC ACID PO) Take 250 mg by mouth 2 (two) times daily after a  meal. "DO NOT GIVE NIACIN"     repaglinide (PRANDIN) 2 MG tablet Take 2 mg by mouth 3 (three) times daily before meals. 15 minutes before each meal     simvastatin (ZOCOR) 10 MG tablet Take 10 mg by mouth See admin instructions. Must take after supper "Must Have AURA-Binda Brand " made in [place] ONLY 30 tablet 6   SODIUM BICARBONATE PO Take 162-165 mg by mouth at bedtime.     trandolapril-verapamil (TARKA) 4-240 MG per tablet Take 0.5 tablets by mouth at bedtime.     UNABLE TO FIND Take 20 mg by mouth daily after breakfast. Zeaxanthin     VITAMIN A PO Take 2,300 mcg by mouth daily after breakfast.     Vitamin D3 (VITAMIN D) 25 MCG tablet Take 1,000 Units by mouth every other day.     No current facility-administered medications for this  visit.    Allergies  Allergen Reactions   Other Other (See Comments)    Other reaction(s): Cough, Other (See Comments) Freshly sprayed hairspray causes coughing, sneezing, and watery eyes. HAIR SPRAY CAUSES SNEEZING, WATERY EYES AND COUGH   Do not eat Red Beef and Popcorn    Chicken Meat (Diagnostic) Other (See Comments)    Maceo Pro / Can eat on if cooked in non cholesterol oil   Eggs Or Egg-Derived Products Other (See Comments)    [place]    Review of Systems negative except from HPI and PMH  Physical Exam BP 124/66   Pulse 66   Ht 6' (1.829 m)   SpO2 97%   BMI 21.86 kg/m  Well developed and well nourished in no acute distress sitting in wheel chair with a nasty recurring cough HENT normal Neck supple with JVP-flat Clear Device pocket well healed; without hematoma or erythema.  There is no tethering  Regular rate and rhythm, no  gallop No  murmur Abd-soft with active BS No Clubbing cyanosis  edema Skin-warm and dry A & Oriented  Grossly normal sensory and motor function  ECG atrial fibrillation with ventricular pacing and occasional PVC  Device function is  normal.   Programming changes    See Paceart for details     Assessment and  Plan  Complete heart block- intermittent .   Pacemaker: St. Jude       Hypertension well-controlled   Chest pain atypical  Atrial fib-persistent  PVCs  Frequent   COPD  Intermittent atrial fibrillation may be responsible for his palpitations.  Ventricular rates are well-controlled so will not make any medication adjustments.  Continues to take the Eliquis without clinical bleeding and hemoglobin is stable.  Has nocturnal chest discomfort consistent with angina decubitus.  No known coronary disease, very few x-rays throughout the the medical record, but suspect has coronary disease with his longstanding smoking.

## 2022-08-09 ENCOUNTER — Other Ambulatory Visit (HOSPITAL_BASED_OUTPATIENT_CLINIC_OR_DEPARTMENT_OTHER): Payer: Self-pay

## 2022-08-26 ENCOUNTER — Ambulatory Visit: Payer: Medicare Other | Attending: Internal Medicine

## 2022-08-26 DIAGNOSIS — I442 Atrioventricular block, complete: Secondary | ICD-10-CM

## 2022-08-28 LAB — CUP PACEART REMOTE DEVICE CHECK
Date Time Interrogation Session: 20240124125031
Implantable Lead Connection Status: 753985
Implantable Lead Connection Status: 753985
Implantable Lead Implant Date: 20111128
Implantable Lead Implant Date: 20111128
Implantable Lead Location: 753859
Implantable Lead Location: 753860
Implantable Pulse Generator Implant Date: 20220722
Pulse Gen Model: 2272
Pulse Gen Serial Number: 3936953

## 2022-09-09 ENCOUNTER — Telehealth: Payer: Self-pay | Admitting: Internal Medicine

## 2022-09-09 NOTE — Telephone Encounter (Signed)
Patient called to talk with Dr. Caryl Comes or nurse. Patient would not give out any additional information.

## 2022-09-09 NOTE — Telephone Encounter (Signed)
Spoke with patient and his wife and they have sent in a transmission successfully and would like a call back as to what to do next

## 2022-09-09 NOTE — Telephone Encounter (Signed)
Patient stated he may had a mild heart attach this morning 0400 due to chest pain this lasted 5 min. He did not seek medical attention. Patient stated denies chest pain, shortness of breath, and numbness of the extremities. Device clinic will contact patient to assist with manual transmission. According to Dr. Caryl Comes previous note :Has nocturnal chest discomfort consistent with angina decubitus.  No known coronary disease, very few x-rays throughout the the medical record, but suspect has coronary disease with his longstanding smoking.   ** Start Imdur '30mg'$  - 1/2 tablet by mouth nightly. Will forward this to an MD.

## 2022-09-09 NOTE — Telephone Encounter (Signed)
Spoke with DOD have the patient schedule f/u with Dr. Caryl Comes. Patient has scheduled telephone visit with Dr. Caryl Comes on 2/8. Patient stated he is taking medications as prescribed. Advised patient to immediately seek medical attention or call 911 if develop new or worsening symptoms. Patient voiced understanding.    Remote transmission reviewed.  There are no episodes to correlate with symptoms. Patient has persistent afib hx and only episodes were related to normal mode switching function of the device and none recorded during time period of symptoms.  Device function is normal.  Ventricular rates are well controlled during AF episodes.  Presenting rhythm is normal AS/VP 60-70bpm

## 2022-09-09 NOTE — Telephone Encounter (Signed)
Told patient that we need additional information to give to doctor or nurse. Patient then said that he thinks he had a mild heart attack this morning  Pt c/o of Chest Pain: STAT if CP now or developed within 24 hours  1. Are you having CP right now? Yes   2. Are you experiencing any other symptoms (ex. SOB, nausea, vomiting, sweating)? SOB  3. How long have you been experiencing CP? This morning (4:00 am)  4. Is your CP continuous or coming and going? continuous  5. Have you taken Nitroglycerin? No  ?

## 2022-09-09 NOTE — Telephone Encounter (Signed)
Remote transmission reviewed.  There are no episodes to correlate with symptoms. Patient has persistent afib hx and only episodes were related to normal mode switching function of the device and none recorded during time period of symptoms.  Device function is normal.  Ventricular rates are well controlled during AF episodes.  Presenting rhythm is normal AS/VP 60-70bpm

## 2022-09-12 ENCOUNTER — Telehealth: Payer: Self-pay

## 2022-09-12 ENCOUNTER — Encounter: Payer: Self-pay | Admitting: Internal Medicine

## 2022-09-12 ENCOUNTER — Ambulatory Visit: Payer: Medicare Other | Attending: Internal Medicine | Admitting: Internal Medicine

## 2022-09-12 VITALS — Ht 72.0 in

## 2022-09-12 DIAGNOSIS — R079 Chest pain, unspecified: Secondary | ICD-10-CM | POA: Diagnosis not present

## 2022-09-12 DIAGNOSIS — I442 Atrioventricular block, complete: Secondary | ICD-10-CM

## 2022-09-12 DIAGNOSIS — Z950 Presence of cardiac pacemaker: Secondary | ICD-10-CM

## 2022-09-12 DIAGNOSIS — I4819 Other persistent atrial fibrillation: Secondary | ICD-10-CM | POA: Diagnosis not present

## 2022-09-12 NOTE — Progress Notes (Signed)
Electrophysiology TeleHealth Note      Date:  09/12/2022   ID:  Gerald Hardin., DOB [birth year], MRN [id number]  Location: patient's home  Provider location: [address] [place]  Evaluation Performed: Follow-up visit  PCP:  Patient, No Pcp Per  Cardiologist:     Electrophysiologist:  SK   Chief Complaint:  chest pain   History of Present Illness:    Gerald Hardin. is a 89 y.o. male who presents via audio/video conferencing for a telehealth visit today.  Since last being seen in our clinic for seen following pacemaker implantation for complete heart block and syncope in  December 2012; Gen change 7/22- some concerns thereafter of pain but without evidence of infection 10/22   Echocardiogram November 2011 demonstrated normal left ventricular function without wall motion abnormalities. It mildly elevated pulmonary pressures     Date Cr K Hgb  10/21 1.39       7/22 1.15 4.4 14.2     the patient reports recurrent chest minute>>but then lasted about 8 minutes which is not his normal.  This was 3 days ago.  No recurrent pain.  Left-sided.  Pressure.  Reminded him of how he felt when he had his first nuclear pharmacological stress test 30 years ago.  No residual change in function    The patient denies symptoms of fevers, chills, cough, or new SOB worrisome for COVID 19.    Past Medical History:  Diagnosis Date   CA - cancer    Right Axillary Squamous Cell   Cataracts, bilateral    CHB (complete heart block) ([place]) nov 2011   CKD (chronic kidney disease), stage II    COPD (chronic obstructive pulmonary disease) (HCC)    Diabetes mellitus    DVT of upper extremity (deep vein thrombosis) ([place])    post pacer   Hyperlipidemia    Hypertension    Macular degeneration    Pacemaker    st judes    Past Surgical History:  Procedure Laterality Date   PACEMAKER PLACEMENT  Jul 02 2010   [facility] GENERATOR CHANGEOUT N/A 02/23/2021   Procedure: PPM GENERATOR  CHANGEOUT;  Surgeon: Deboraha Sprang, MD;  Location: [place] [facility];  Service: Cardiovascular;  Laterality: N/A;   SQUAMOUS CELL CARCINOMA EXCISION     and XRT   xrt      Current Outpatient Medications  Medication Sig Dispense Refill   acetaminophen (TYLENOL) 500 MG tablet Take 1,000 mg by mouth daily before breakfast. Caplets     Ascorbic Acid (VITAMIN C) 1000 MG tablet Take 1,000 mg by mouth daily.     b complex vitamins tablet Take 1 tablet by mouth at bedtime.     chlorpheniramine (CHLOR-TRIMETON) 4 MG tablet Take 4 mg by mouth daily before breakfast.     Coenzyme Q10 (COQ10) 100 MG CAPS Take 300 mg by mouth daily after breakfast.     COVID-19 mRNA Vac-TriS, Pfizer, (PFIZER-BIONT COVID-19 VAC-TRIS) SUSP injection Inject into the muscle. 0.3 mL 0   ELIQUIS 5 MG TABS tablet TAKE 1 TABLET BY MOUTH 2 TIMES DAILY 180 tablet 3   fenofibrate (TRICOR) 145 MG tablet Take 145 mg by mouth at bedtime.  1/2 tab daily     ferrous sulfate 325 (65 FE) MG tablet Take 325 mg by mouth 3 (three) times a week.     GLUCOSAMINE-CHONDROITIN PO Take 2 capsules by mouth daily after breakfast. 600/1200 mg  Lactobacillus (ACIDOPHILUS) CAPS Take 2 capsules by mouth 3 (three) times daily. 5-8 minutes before each meal with 1/2 cup of caffeine coffee Probiotic 100 Million     Niacin (NICOTINIC ACID PO) Take 250 mg by mouth daily. "DO NOT GIVE NIACIN"     repaglinide (PRANDIN) 2 MG tablet Take 2 mg by mouth. 15 minutes before each meal     simvastatin (ZOCOR) 10 MG tablet Take 10 mg by mouth See admin instructions. Must take after supper "Must Have AURA-Binda Brand " made in [place] ONLY 30 tablet 6   SODIUM BICARBONATE PO Take 162-165 mg by mouth at bedtime.     trandolapril-verapamil (TARKA) 4-240 MG per tablet Take 0.5 tablets by mouth at bedtime.     UNABLE TO FIND Take 20 mg by mouth daily after breakfast. Zeaxanthin     VITAMIN A PO Take 2,300 mcg by mouth daily after breakfast.     Vitamin D3 (VITAMIN D)  25 MCG tablet Take 1,000 Units by mouth every other day.     isosorbide mononitrate (IMDUR) 30 MG 24 hr tablet Take 0.5 tablets (15 mg total) by mouth daily. 45 tablet 3   Lutein 20 MG TABS Take 20 mg by mouth daily after breakfast.     No current facility-administered medications for this visit.    Allergies:   Other, Chicken meat (diagnostic), and Eggs or egg-derived products   ROS:  Please see the history of present illness.   All other systems are personally reviewed and negative.    Exam:    Vital Signs:  Ht 6' (1.829 m)   BMI 21.86 kg/m        Labs/Other Tests and Data Reviewed:    Other studies personally reviewed:      ASSESSMENT & PLAN:    Complete heart block- intermittent .    Pacemaker: St. Jude        Hypertension well-controlled    Chest pain atypical   Atrial fib-persistent   PVCs  Frequent    COPD  Atypical chest pain in a patient whose pretest probability is are quite high with longstanding smoking hypertension and diabetes.  Will continue him on his isosorbide and have him take it at night as most of his symptoms are in the early morning upon arising.  Will arrange for Lexi Myoview scan   Follow-up:  As Scheduled or based on test reuslts    Current medicines are reviewed at length with the patient today.   The patient does not have concerns regarding his medicines.  The following changes were made today:  to take isosorbide at night and not In am   Labs/ tests ordered today include: fasting lipids; lexi-myoview No orders of the defined types were placed in this encounter.     Today, I have spent 21 minutes with the patient with telehealth technology discussing the above.  Signed, Virl Axe, MD  09/12/2022 4:53 PM     [place] [address] [phone] (office) [phone] (fax)

## 2022-09-12 NOTE — Patient Instructions (Signed)
Medication Instructions:  Your physician recommends that you continue on your current medications as directed. Please refer to the Current Medication list given to you today.  *If you need a refill on your cardiac medications before your next appointment, please call your pharmacy*   Lab Work: None ordered.  If you have labs (blood work) drawn today and your tests are completely normal, you will receive your results only by: [place] (if you have MyChart) OR A paper copy in the mail If you have any lab test that is abnormal or we need to change your treatment, we will call you to review the results.   Testing/Procedures: Your physician has requested that you have a lexiscan myoview. For further information please visit [email or web address]. Please follow instruction sheet, as given.    Follow-Up: At [facility], you and your health needs are our priority.  As part of our continuing mission to provide you with exceptional heart care, we have created designated Provider Care Teams.  These Care Teams include your primary Cardiologist (physician) and Advanced Practice Providers (APPs -  Physician Assistants and Nurse Practitioners) who all work together to provide you with the care you need, when you need it.  We recommend signing up for the patient portal called "MyChart".  Sign up information is provided on this After Visit Summary.  MyChart is used to connect with patients for Virtual Visits (Telemedicine).  Patients are able to view lab/test results, encounter notes, upcoming appointments, etc.  Non-urgent messages can be sent to your provider as well.   To learn more about what you can do with MyChart, go to [email or web address].    Your next appointment:   To be scheduled

## 2022-09-12 NOTE — Telephone Encounter (Signed)
..   Pt understands that although there may be some limitations with this type of visit, we will take all precautions to reduce any security or privacy concerns.  Pt understands that this will be treated like an in office visit and we will file with pt's insurance, and there may be a patient responsible charge related to this service. ? ?

## 2022-09-13 ENCOUNTER — Telehealth (HOSPITAL_COMMUNITY): Payer: Self-pay | Admitting: *Deleted

## 2022-09-13 NOTE — Telephone Encounter (Signed)
Patient given detailed instructions per Myocardial Perfusion Study Information Sheet for the test on 09/18/2022 at 1:00. Patient notified to arrive 15 minutes early and that it is imperative to arrive on time for appointment to keep from having the test rescheduled.  If you need to cancel or reschedule your appointment, please call the office within 24 hours of your appointment. . Patient verbalized understanding.Gerald Hardin

## 2022-09-17 ENCOUNTER — Telehealth: Payer: Self-pay | Admitting: Internal Medicine

## 2022-09-17 NOTE — Telephone Encounter (Signed)
Patient stated he would like to speak directly with RN Molly/Lisa regarding test tomorrow.

## 2022-09-17 NOTE — Telephone Encounter (Signed)
Spoke with pt who is asking to review instructions for Myoview/Lexiscan scheduled for 09/18/2022.  Instructions reviewed.  Pt is asking if he will be receiving anesthesia for this procedure.  Pt advised he will not  receive anesthesia for this particular test nor will a provider be present unless needed.  Pt verbalizes understanding and thanked Therapist, sports for the callback.

## 2022-09-18 ENCOUNTER — Encounter (HOSPITAL_COMMUNITY): Payer: Medicare Other

## 2022-09-19 ENCOUNTER — Encounter (HOSPITAL_COMMUNITY): Payer: Medicare Other

## 2022-09-19 ENCOUNTER — Encounter (HOSPITAL_COMMUNITY): Payer: Self-pay | Admitting: Internal Medicine

## 2022-09-24 ENCOUNTER — Encounter (HOSPITAL_COMMUNITY): Payer: Medicare Other

## 2022-09-26 NOTE — Telephone Encounter (Signed)
Spoke with patient and he had already been given detailed instructions about his STRESS TEST for 09/27/22. He was reminded to arrive 15 minutes early.

## 2022-09-27 ENCOUNTER — Ambulatory Visit (HOSPITAL_COMMUNITY): Payer: Medicare Other | Attending: Internal Medicine

## 2022-09-27 DIAGNOSIS — R079 Chest pain, unspecified: Secondary | ICD-10-CM | POA: Diagnosis present

## 2022-09-27 MED ORDER — REGADENOSON 0.4 MG/5ML IV SOLN: 0.4000 mg | Freq: Once | INTRAVENOUS | Status: AC

## 2022-09-27 MED ORDER — TECHNETIUM TC 99M TETROFOSMIN IV KIT: 31.9000 | PACK | Freq: Once | INTRAVENOUS | Status: AC | PRN

## 2022-09-27 MED ORDER — TECHNETIUM TC 99M TETROFOSMIN IV KIT
10.5000 | PACK | Freq: Once | INTRAVENOUS | Status: AC | PRN
  Administered 2022-09-27: 10.5 via INTRAVENOUS

## 2022-09-30 LAB — MYOCARDIAL PERFUSION IMAGING
LV dias vol: 62 mL (ref 62–150)
LV sys vol: 22 mL
Nuc Stress EF: 64 %
Peak HR: 77 {beats}/min
Rest HR: 71 {beats}/min
Rest Nuclear Isotope Dose: 10.5 mCi
SDS: 7
SRS: 5
SSS: 13
ST Depression (mm): 0 mm
Stress Nuclear Isotope Dose: 31.9 mCi
TID: 0.99

## 2022-10-11 ENCOUNTER — Telehealth (HOSPITAL_COMMUNITY): Payer: Self-pay | Admitting: Radiology

## 2022-10-11 NOTE — Telephone Encounter (Signed)
Patient called to get Lexiscan scan report. Please call with results.

## 2022-10-14 NOTE — Progress Notes (Signed)
Remote pacemaker transmission.   

## 2022-10-16 ENCOUNTER — Telehealth: Payer: Self-pay

## 2022-10-16 NOTE — Telephone Encounter (Signed)
-----   Message from Deboraha Sprang, MD sent at 10/16/2022 10:50 AM EDT ----- Please Inform Patient that stress test showed no evidence of ischemia,  normal  heart muscle function but it does show possible heart muscle damage-- something we didn't expect.  If he is continuing to have pain, lets arrange CTA please;  o/w would follow clincially   Thanks

## 2022-10-16 NOTE — Telephone Encounter (Signed)
Spoke with pt and advised of results.  Pt denies any recurrence of chest pain. Pt advised without chest pain Dr Caryl Comes will follow clinically.  Pt has an appointment scheduled for 02/07/2023 with Dr Caryl Comes. Reviewed ED precautions with pt.  Pt verbalizes understanding and agrees with current plan.

## 2022-11-25 ENCOUNTER — Ambulatory Visit (INDEPENDENT_AMBULATORY_CARE_PROVIDER_SITE_OTHER): Payer: Medicare Other

## 2022-11-25 DIAGNOSIS — I442 Atrioventricular block, complete: Secondary | ICD-10-CM

## 2022-11-26 LAB — CUP PACEART REMOTE DEVICE CHECK
Battery Remaining Longevity: 61 mo
Battery Remaining Percentage: 83 %
Battery Voltage: 2.99 V
Brady Statistic AP VP Percent: 62 %
Brady Statistic AP VS Percent: 1 %
Brady Statistic AS VP Percent: 35 %
Brady Statistic AS VS Percent: 1 %
Brady Statistic RA Percent Paced: 41 %
Brady Statistic RV Percent Paced: 97 %
Date Time Interrogation Session: 20240422033413
Implantable Lead Connection Status: 753985
Implantable Lead Connection Status: 753985
Implantable Lead Implant Date: 20111128
Implantable Lead Implant Date: 20111128
Implantable Lead Location: 753859
Implantable Lead Location: 753860
Implantable Pulse Generator Implant Date: 20220722
Lead Channel Impedance Value: 400 Ohm
Lead Channel Impedance Value: 480 Ohm
Lead Channel Pacing Threshold Amplitude: 0.625 V
Lead Channel Pacing Threshold Amplitude: 0.875 V
Lead Channel Pacing Threshold Pulse Width: 0.4 ms
Lead Channel Pacing Threshold Pulse Width: 0.4 ms
Lead Channel Sensing Intrinsic Amplitude: 10.3 mV
Lead Channel Sensing Intrinsic Amplitude: 3.4 mV
Lead Channel Setting Pacing Amplitude: 1.125
Lead Channel Setting Pacing Amplitude: 5 V
Lead Channel Setting Pacing Pulse Width: 0.4 ms
Lead Channel Setting Sensing Sensitivity: 2.5 mV
Pulse Gen Model: 2272
Pulse Gen Serial Number: 3936953

## 2022-11-27 ENCOUNTER — Telehealth: Payer: Self-pay

## 2022-11-27 NOTE — Telephone Encounter (Signed)
Following alert received from [employer] received for atrial amplitude auto adjusted to 5 volts, unable to properly run when in AF.   DC apt made 11/28/22 @ 2:00 PM check RA threshold and possibly turn off auto capture.

## 2022-11-27 NOTE — Telephone Encounter (Signed)
Patient is requesting copies of stress test. States he has tried calling medical records for 6 weeks and no one has returned call.

## 2022-11-28 ENCOUNTER — Ambulatory Visit: Payer: Medicare Other | Attending: Cardiovascular Disease

## 2022-11-28 ENCOUNTER — Telehealth: Payer: Self-pay

## 2022-11-28 DIAGNOSIS — I442 Atrioventricular block, complete: Secondary | ICD-10-CM

## 2022-11-28 LAB — CUP PACEART INCLINIC DEVICE CHECK
Battery Remaining Longevity: 94 mo
Battery Voltage: 2.99 V
Brady Statistic RA Percent Paced: 40 %
Brady Statistic RV Percent Paced: 97 %
Date Time Interrogation Session: 20240425175204
Implantable Lead Connection Status: 753985
Implantable Lead Connection Status: 753985
Implantable Lead Implant Date: 20111128
Implantable Lead Implant Date: 20111128
Implantable Lead Location: 753859
Implantable Lead Location: 753860
Implantable Pulse Generator Implant Date: 20220722
Lead Channel Impedance Value: 412.5 Ohm
Lead Channel Impedance Value: 475 Ohm
Lead Channel Pacing Threshold Amplitude: 0 V
Lead Channel Pacing Threshold Amplitude: 0.75 V
Lead Channel Pacing Threshold Amplitude: 0.75 V
Lead Channel Pacing Threshold Pulse Width: 0.4 ms
Lead Channel Pacing Threshold Pulse Width: 0.4 ms
Lead Channel Pacing Threshold Pulse Width: 0.4 ms
Lead Channel Sensing Intrinsic Amplitude: 0.7 mV
Lead Channel Sensing Intrinsic Amplitude: 10.3 mV
Lead Channel Setting Pacing Amplitude: 1.125
Lead Channel Setting Pacing Amplitude: 2.5 V
Lead Channel Setting Pacing Pulse Width: 0.4 ms
Lead Channel Setting Sensing Sensitivity: 2.5 mV
Pulse Gen Model: 2272
Pulse Gen Serial Number: 3936953

## 2022-11-28 NOTE — Telephone Encounter (Signed)
Patient brought in today to device clinic to check RA threshold in high output.  Normal device check.  RA threshold testing impact due to patient in Afib for past 28 days.  Turned off auto cap and hard programmed with appropriate safety margin.  (Full details in PACEART report).  This is an elevation in burden for patient although he does have dx of persistent afib, it had been more intermittent.   Confirmed patient is on [facility] and is not having any symptoms.  Ventricular rates are well controlled.   This is just a flag for Dr. Graciela Husbands as this is an increase in patient's overall trend.

## 2022-11-28 NOTE — Progress Notes (Signed)
Patient brought in to clinic due to RA threshold going in to high output. Pacemaker check in clinic. Normal device function.  RA high output triggered due to patient being in persistent AF X 28 days when attempting auto threshold tests.  Patient has dx of persistent AF and is on an OAC.   Thresholds, sensing, impedances consistent with previous measurements. Device programmed to maximize longevity. Numerous mode switch episodes related to AF. AT/AF burden 34% since last check; however, 100% in AF for past 28 days. Patient is asymptomatic and with good ventricular rate control.  Device programmed at appropriate safety margins. Histogram distribution appropriate for patient activity level. Device programmed to optimize intrinsic conduction. Estimated longevity 8 years. Patient enrolled in remote follow-up.  PROGRAMMING CHANGES TODAY: 1.  Turned RA auto capture OFF and hard programmed output to 2.5V @ 0.81ms. 2.  Turned RA sensitivity to auto (0.51mv) to support better safety margins.

## 2022-12-02 NOTE — Telephone Encounter (Signed)
noted 

## 2022-12-03 NOTE — Telephone Encounter (Signed)
Spoke with pt who states he is upset that it has taken so long for him to hear from Medical Records.  Pt states he has called every week for the past 6 plus weeks to the number provided for medical records and left voicemail messages.  Today is the first call he has had from the medical records department.  Pt states he now has the records he needed but it should not have taken this long. Pt advised will forward concern to management to make them aware.  Pt thanked Charity fundraiser for the call.

## 2022-12-05 ENCOUNTER — Telehealth: Payer: Self-pay | Admitting: Internal Medicine

## 2022-12-05 NOTE — Telephone Encounter (Signed)
Spoke with pt who states he is having cataract surgery this month and needs to know how long to hold Eliquis.  Pt advised [facility] will need to dax clearance request to [phone].  Pt verbalizes understanding and thanked Charity fundraiser for the call.

## 2022-12-05 NOTE — Telephone Encounter (Signed)
Pt is requesting a callback from [facility] in regards to a upcoming surgery he stated he's having at [facility] and would only like to speak with her about it. Please advise

## 2022-12-20 ENCOUNTER — Telehealth: Payer: Self-pay | Admitting: Internal Medicine

## 2022-12-20 ENCOUNTER — Telehealth: Payer: Self-pay | Admitting: *Deleted

## 2022-12-20 NOTE — Telephone Encounter (Signed)
   Pre-operative Risk Assessment    Patient Name: Gerald Hardin.  DOB: [birth year] MRN: [id number]      Request for Surgical Clearance    Procedure:   CATARACT REMOVAL   Date of Surgery:  Clearance 12/26/22                                 Surgeon:  NOT LISTED Surgeon's Group or Practice Name:  [facility] PRE-ANESTHESIA TESTING UNIT  Phone number:  [phone] Fax number:  [phone] ATTN: Gerald Sox, RN   Type of Clearance Requested:   - Medical ; NO MEDICATIONS INDICATED AS NEEDING TO BE HELD   Type of Anesthesia:  MAC WITH TOPICAL   Additional requests/questions:    Gerald Hardin   12/20/2022, 10:24 AM

## 2022-12-20 NOTE — Telephone Encounter (Signed)
   Patient Name: Gerald Hardin.  DOB: [birth year] MRN: [id number]  Primary Cardiologist: Dr. Graciela Husbands  Chart reviewed as part of pre-operative protocol coverage. Cataract extractions are recognized in guidelines as low risk surgeries that do not typically require specific preoperative testing or holding of blood thinner therapy. Therefore, given past medical history and time since last visit, based on ACC/AHA guidelines, Gerald Hardin. would be at acceptable risk for the planned procedure without further cardiovascular testing.   I will route this recommendation to the requesting party via Epic fax function and remove from pre-op pool.  Please call with questions.  Levi Aland, NP-C  12/20/2022, 10:47 AM [address] Office [phone] Fax [phone]

## 2022-12-20 NOTE — Telephone Encounter (Signed)
Pt is calling regarding cataract surgical clearance.  Pt advised clearance has been completed and returned to [facility]. Pt may continue Eliquis as prescribed.  Pt verbalizes understanding and thanked Charity fundraiser for the call back.

## 2022-12-20 NOTE — Telephone Encounter (Signed)
Patient is calling to talk with Dr. Graciela Husbands or nurse. Would not give out any details.

## 2023-01-01 NOTE — Progress Notes (Signed)
Remote pacemaker transmission.   

## 2023-02-03 ENCOUNTER — Other Ambulatory Visit: Payer: Self-pay | Admitting: Physician Assistant

## 2023-02-03 NOTE — Telephone Encounter (Signed)
Prescription refill request for Eliquis received. Indication:afib Last office visit:1/24 ZOX:WRUEA labs Age:89  Weight:73.1  kg  Prescription refilled

## 2023-02-04 ENCOUNTER — Ambulatory Visit: Payer: Medicare Other | Admitting: Internal Medicine

## 2023-02-07 ENCOUNTER — Encounter: Payer: Medicare Other | Admitting: Internal Medicine

## 2023-02-17 ENCOUNTER — Telehealth: Payer: Self-pay | Admitting: Internal Medicine

## 2023-02-17 NOTE — Telephone Encounter (Signed)
Pt would like to talk with a nurse about his heart monitor. Please advise.

## 2023-02-17 NOTE — Telephone Encounter (Signed)
Call to patient. He states letter Saturday. They are going to call Tuesday to check "monitor".  States hit has not been checked in 2 years.  6 weeks ago had cataract removal. He has final post op appt tomorrow at [facility]. He has eye injections next Monday at [facility] again. He discusses most of his history and going back to [place].  I have asked several times what he is calling about. He gets angry because I do not understand.  I asked did he want to reschedule and he states no but then after several more minutes he states he does want to reschedule.   Advised I will have someone reach out to him to reschedule.  He states he is available on any Friday or after his Injections. He will be gone today after 10:30 .  Please call to reschedule his pacemaker check

## 2023-02-17 NOTE — Telephone Encounter (Signed)
I tried to call the patient twice but got a busy signal.

## 2023-02-18 NOTE — Telephone Encounter (Signed)
I called the patient and no answer/no voicemail.

## 2023-02-24 ENCOUNTER — Ambulatory Visit (INDEPENDENT_AMBULATORY_CARE_PROVIDER_SITE_OTHER): Payer: Medicare Other

## 2023-02-24 DIAGNOSIS — I442 Atrioventricular block, complete: Secondary | ICD-10-CM

## 2023-02-26 LAB — CUP PACEART REMOTE DEVICE CHECK
Battery Remaining Longevity: 98 mo
Battery Remaining Percentage: 80 %
Battery Voltage: 2.99 V
Brady Statistic AP VP Percent: 0 %
Brady Statistic AP VS Percent: 0 %
Brady Statistic AS VP Percent: 0 %
Brady Statistic AS VS Percent: 0 %
Brady Statistic RA Percent Paced: 1 %
Brady Statistic RV Percent Paced: 98 %
Date Time Interrogation Session: 20240722021053
Implantable Lead Connection Status: 753985
Implantable Lead Connection Status: 753985
Implantable Lead Implant Date: 20111128
Implantable Lead Implant Date: 20111128
Implantable Lead Location: 753859
Implantable Lead Location: 753860
Implantable Pulse Generator Implant Date: 20220722
Lead Channel Impedance Value: 410 Ohm
Lead Channel Impedance Value: 480 Ohm
Lead Channel Pacing Threshold Amplitude: 1 V
Lead Channel Pacing Threshold Pulse Width: 0.4 ms
Lead Channel Sensing Intrinsic Amplitude: 0.7 mV
Lead Channel Sensing Intrinsic Amplitude: 6.4 mV
Lead Channel Setting Pacing Amplitude: 1.25 V
Lead Channel Setting Pacing Amplitude: 2.5 V
Lead Channel Setting Pacing Pulse Width: 0.4 ms
Lead Channel Setting Sensing Sensitivity: 2.5 mV
Pulse Gen Model: 2272
Pulse Gen Serial Number: 3936953

## 2023-02-28 NOTE — Telephone Encounter (Signed)
Letter sent at 02/28/2023.

## 2023-03-08 LAB — LAB REPORT - SCANNED
A1c: 5.2
EGFR: 68
PSA, Total: 5.2

## 2023-03-14 NOTE — Progress Notes (Signed)
Remote pacemaker transmission.   

## 2023-03-17 ENCOUNTER — Ambulatory Visit: Payer: Medicare Other | Attending: Internal Medicine | Admitting: Internal Medicine

## 2023-03-17 ENCOUNTER — Encounter: Payer: Self-pay | Admitting: Internal Medicine

## 2023-03-17 ENCOUNTER — Telehealth: Payer: Self-pay | Admitting: Internal Medicine

## 2023-03-17 VITALS — BP 114/58 | HR 70 | Ht 72.0 in

## 2023-03-17 DIAGNOSIS — I442 Atrioventricular block, complete: Secondary | ICD-10-CM | POA: Diagnosis present

## 2023-03-17 DIAGNOSIS — I4819 Other persistent atrial fibrillation: Secondary | ICD-10-CM | POA: Insufficient documentation

## 2023-03-17 DIAGNOSIS — Z950 Presence of cardiac pacemaker: Secondary | ICD-10-CM | POA: Diagnosis present

## 2023-03-17 LAB — CUP PACEART INCLINIC DEVICE CHECK
Battery Remaining Longevity: 102 mo
Battery Voltage: 2.99 V
Brady Statistic RA Percent Paced: 0.04 %
Brady Statistic RV Percent Paced: 99 %
Date Time Interrogation Session: 20240812153734
Implantable Lead Connection Status: 753985
Implantable Lead Connection Status: 753985
Implantable Lead Implant Date: 20111128
Implantable Lead Implant Date: 20111128
Implantable Lead Location: 753859
Implantable Lead Location: 753860
Implantable Pulse Generator Implant Date: 20220722
Lead Channel Impedance Value: 412.5 Ohm
Lead Channel Impedance Value: 475 Ohm
Lead Channel Pacing Threshold Amplitude: 0 V
Lead Channel Pacing Threshold Amplitude: 1 V
Lead Channel Pacing Threshold Amplitude: 1 V
Lead Channel Pacing Threshold Pulse Width: 0.4 ms
Lead Channel Pacing Threshold Pulse Width: 0.4 ms
Lead Channel Pacing Threshold Pulse Width: 0.4 ms
Lead Channel Sensing Intrinsic Amplitude: 0.8 mV
Lead Channel Sensing Intrinsic Amplitude: 6.4 mV
Lead Channel Setting Pacing Amplitude: 1.125
Lead Channel Setting Pacing Amplitude: 2.5 V
Lead Channel Setting Pacing Pulse Width: 0.4 ms
Lead Channel Setting Sensing Sensitivity: 2.5 mV
Pulse Gen Model: 2272
Pulse Gen Serial Number: 3936953

## 2023-03-17 NOTE — Patient Instructions (Addendum)
Medication Instructions:  Your physician recommends that you continue on your current medications as directed. Please refer to the Current Medication list given to you today.  *If you need a refill on your cardiac medications before your next appointment, please call your pharmacy*   Lab Work: TODAY: CBC If you have labs (blood work) drawn today and your tests are completely normal, you will receive your results only by: MyChart Message (if you have MyChart) OR A paper copy in the mail If you have any lab test that is abnormal or we need to change your treatment, we will call you to review the results.  Follow-Up: At [facility], you and your health needs are our priority.  As part of our continuing mission to provide you with exceptional heart care, we have created designated Provider Care Teams.  These Care Teams include your primary Cardiologist (physician) and Advanced Practice Providers (APPs -  Physician Assistants and Nurse Practitioners) who all work together to provide you with the care you need, when you need it.  Your next appointment:   6 months  Provider:   You may see Sherryl Manges, MD or one of the following Advanced Practice Providers on your designated Care Team:   Francis Dowse, [place] [address] [place], [place] Sherie Don, NP Canary Brim, NP

## 2023-03-17 NOTE — Telephone Encounter (Signed)
Patient wants call back from RN Mindi Junker to confirm tests faxed from his [place] doctor has been received for today's visit.

## 2023-03-17 NOTE — Telephone Encounter (Signed)
Patient has appt with Dr. Graciela Husbands today at 2:45pm.  No uploaded documents with  tests from MD in [place] in chart.  Will forward to covering nurse.

## 2023-03-17 NOTE — Progress Notes (Signed)
Patient Care Team: Patient, No Pcp Per as PCP - General (General Practice) Duke Salvia, MD as PCP - Electrophysiology (Cardiology)   HPI  Gerald Hardin. is a 89 y.o. male seen following pacemaker implantation for complete heart block and syncope in  December 2012; Gen change 7/22- some concerns thereafter of pain but without evidence of infection 10/22 Atrial fibrillation now persistent  Chronic shortness of breath.  Minimal ambulation.  Some epigastric discomfort when he eats meals or drinks coffee.  Improves with deep breathing.  No edema.  No overt bleeding.  No palpitations.  Notably no change in exercise tolerance over the last 6 months.     DATE TEST EF   2/24 MYOVIEW 64% Prior infarction           Date Cr K Hgb  10/21 1.39      7/22 1.15 4.4 14.2  8/24 1.06 4.2       Past Medical History:  Diagnosis Date   CA - cancer    Right Axillary Squamous Cell   Cataracts, bilateral    CHB (complete heart block) (HCC) nov 2011   CKD (chronic kidney disease), stage II    COPD (chronic obstructive pulmonary disease) (HCC)    Diabetes mellitus    DVT of upper extremity (deep vein thrombosis) (HCC)    post pacer   Hyperlipidemia    Hypertension    Macular degeneration    Pacemaker    st judes    Past Surgical History:  Procedure Laterality Date   PACEMAKER PLACEMENT  Jul 02 2010   [facility] GENERATOR CHANGEOUT N/A 02/23/2021   Procedure: PPM GENERATOR CHANGEOUT;  Surgeon: Duke Salvia, MD;  Location: [facility] INVASIVE CV LAB;  Service: Cardiovascular;  Laterality: N/A;   SQUAMOUS CELL CARCINOMA EXCISION     and XRT   xrt      Current Outpatient Medications  Medication Sig Dispense Refill   acetaminophen (TYLENOL) 500 MG tablet Take 1,000 mg by mouth daily before breakfast. Caplets     apixaban (ELIQUIS) 5 MG TABS tablet Take 1 tablet (5 mg total) by mouth 2 (two) times daily. NEEDS LABS FOR ELIQUIS REFILLS, PLEASE CALL OFFICE 60 tablet 0   Ascorbic Acid  (VITAMIN C) 1000 MG tablet Take 1,000 mg by mouth daily.     b complex vitamins tablet Take 1 tablet by mouth at bedtime.     chlorpheniramine (CHLOR-TRIMETON) 4 MG tablet Take 4 mg by mouth daily before breakfast.     Coenzyme Q10 (COQ10) 100 MG CAPS Take 300 mg by mouth daily after breakfast.     COVID-19 mRNA Vac-TriS, Pfizer, (PFIZER-BIONT COVID-19 VAC-TRIS) SUSP injection Inject into the muscle. 0.3 mL 0   fenofibrate (TRICOR) 145 MG tablet Take 145 mg by mouth at bedtime.  1/2 tab daily     ferrous sulfate 325 (65 FE) MG tablet Take 325 mg by mouth 3 (three) times a week.     GLUCOSAMINE-CHONDROITIN PO Take 2 capsules by mouth daily after breakfast. 600/1200 mg     isosorbide mononitrate (IMDUR) 30 MG 24 hr tablet Take 0.5 tablets (15 mg total) by mouth daily. 45 tablet 3   Lactobacillus (ACIDOPHILUS) CAPS Take 2 capsules by mouth 3 (three) times daily. 5-8 minutes before each meal with 1/2 cup of caffeine coffee Probiotic 100 Million     Lutein 20 MG TABS Take 20 mg by mouth daily after breakfast.     Niacin (NICOTINIC ACID  PO) Take 250 mg by mouth daily. "DO NOT GIVE NIACIN"     repaglinide (PRANDIN) 2 MG tablet Take 2 mg by mouth. 15 minutes before each meal     simvastatin (ZOCOR) 10 MG tablet Take 10 mg by mouth See admin instructions. Must take after supper "Must Have AURA-Binda Brand " made in [place] ONLY 30 tablet 6   SODIUM BICARBONATE PO Take 162-165 mg by mouth at bedtime.     trandolapril-verapamil (TARKA) 4-240 MG per tablet Take 0.5 tablets by mouth at bedtime.     UNABLE TO FIND Take 20 mg by mouth daily after breakfast. Zeaxanthin     VITAMIN A PO Take 2,300 mcg by mouth daily after breakfast.     Vitamin D3 (VITAMIN D) 25 MCG tablet Take 1,000 Units by mouth every other day.     No current facility-administered medications for this visit.   Facility-Administered Medications Ordered in Other Visits  Medication Dose Route Frequency Provider Last Rate Last Admin    regadenoson (LEXISCAN) injection SOLN 0.4 mg  0.4 mg Intravenous Once Chilton Si, MD       technetium tetrofosmin (TC-MYOVIEW) injection 31.9 millicurie  31.9 millicurie Intravenous Once PRN Chilton Si, MD        Allergies  Allergen Reactions   Other Other (See Comments)    Other reaction(s): Cough, Other (See Comments) Freshly sprayed hairspray causes coughing, sneezing, and watery eyes. HAIR SPRAY CAUSES SNEEZING, WATERY EYES AND COUGH   Do not eat Red Beef and Popcorn    Chicken Meat (Diagnostic) Other (See Comments)    Foy [place] / Can eat on if cooked in non cholesterol oil   Egg-Derived Products Other (See Comments)    [place]    Review of Systems negative except from HPI and PMH  Physical Exam BP (!) 114/58   Pulse 70   Ht 6' (1.829 m)   SpO2 97%   BMI 21.86 kg/m  Well developed and well nourished in no acute distress HENT normal Neck supple   Bilateral crackles and decreased breath sounds Device pocket well healed; without hematoma or erythema.  There is no tethering  Regular rate and rhythm, no  gallop No murmur Abd-soft with active BS No Clubbing cyanosis no edema Skin-warm and dry A & Oriented  Grossly normal sensory and motor function  ECG atrial fibrillation with ventricular pacing upright QRS lead I and Qr lead V1  Device function is normal. Programming changes none   See Paceart for details    Device function is  normal.   Programming changes    See Paceart for details     Assessment and  Plan  Complete heart block- intermittent .   Pacemaker: St. Jude       Hypertension well-controlled   Chest pain atypical  Atrial fib-persistent  PVCs  Frequent   COPD  Complete heart block stable post pacing  Blood pressure well-controlled we will continue trandolapril/verapamil  No overt bleeding.  Some skin bleeding.  Continue Eliquis 5 mg twice daily and will check a CBC.  Atrial fibrillation is now persistent.  It has been recurring  over the last 12 months prior to 3 months ago and has been persisting.  Without change in symptoms, we will not pursue cardioversion which would require adjunctive antiarrhythmic therapy

## 2023-03-17 NOTE — Telephone Encounter (Signed)
We received lab results that have been reviewed and sent to medical records to be scanned it.

## 2023-03-24 ENCOUNTER — Other Ambulatory Visit: Payer: Self-pay | Admitting: Physician Assistant

## 2023-03-24 DIAGNOSIS — I480 Paroxysmal atrial fibrillation: Secondary | ICD-10-CM

## 2023-03-24 NOTE — Telephone Encounter (Signed)
Eliquis 5mg  refill request received. Patient is 89 years old, weight-73.1kg, Crea-1.06  on 03/07/23  via scanned labs from [facility] at [place], [place], and last seen by Dr. Graciela Husbands on 03/17/23. Dose is appropriate based on dosing criteria.

## 2023-03-27 ENCOUNTER — Telehealth: Payer: Self-pay | Admitting: Internal Medicine

## 2023-03-27 NOTE — Telephone Encounter (Signed)
Patient is returning call in regards to lab results.

## 2023-03-28 NOTE — Telephone Encounter (Signed)
Pt following up on the phone note from yesterday 8/22. Please advise

## 2023-03-28 NOTE — Telephone Encounter (Signed)
Patient returned staff call. 

## 2023-03-28 NOTE — Telephone Encounter (Signed)
Spoke with pt and advised of lab results per Dr Graciela Husbands.  Pt verbalizes understanding and thanked Charity fundraiser for the call.

## 2023-05-26 ENCOUNTER — Ambulatory Visit: Payer: Medicare Other

## 2023-05-26 DIAGNOSIS — I4819 Other persistent atrial fibrillation: Secondary | ICD-10-CM

## 2023-05-26 DIAGNOSIS — I442 Atrioventricular block, complete: Secondary | ICD-10-CM | POA: Diagnosis not present

## 2023-05-26 LAB — CUP PACEART REMOTE DEVICE CHECK
Battery Remaining Longevity: 95 mo
Battery Remaining Percentage: 77 %
Battery Voltage: 2.99 V
Brady Statistic AP VP Percent: 0 %
Brady Statistic AP VS Percent: 0 %
Brady Statistic AS VP Percent: 0 %
Brady Statistic AS VS Percent: 0 %
Brady Statistic RA Percent Paced: 1 %
Brady Statistic RV Percent Paced: 99 %
Date Time Interrogation Session: 20241021020013
Implantable Lead Connection Status: 753985
Implantable Lead Connection Status: 753985
Implantable Lead Implant Date: 20111128
Implantable Lead Implant Date: 20111128
Implantable Lead Location: 753859
Implantable Lead Location: 753860
Implantable Pulse Generator Implant Date: 20220722
Lead Channel Impedance Value: 390 Ohm
Lead Channel Impedance Value: 480 Ohm
Lead Channel Pacing Threshold Amplitude: 0.75 V
Lead Channel Pacing Threshold Pulse Width: 0.4 ms
Lead Channel Sensing Intrinsic Amplitude: 0.8 mV
Lead Channel Sensing Intrinsic Amplitude: 7 mV
Lead Channel Setting Pacing Amplitude: 1 V
Lead Channel Setting Pacing Amplitude: 2.5 V
Lead Channel Setting Pacing Pulse Width: 0.4 ms
Lead Channel Setting Sensing Sensitivity: 2.5 mV
Pulse Gen Model: 2272
Pulse Gen Serial Number: 3936953

## 2023-06-12 NOTE — Progress Notes (Signed)
Remote pacemaker transmission.   

## 2023-08-23 ENCOUNTER — Other Ambulatory Visit: Payer: Self-pay | Admitting: Internal Medicine

## 2023-08-25 ENCOUNTER — Ambulatory Visit (INDEPENDENT_AMBULATORY_CARE_PROVIDER_SITE_OTHER): Payer: Medicare Other

## 2023-08-25 DIAGNOSIS — I442 Atrioventricular block, complete: Secondary | ICD-10-CM | POA: Diagnosis not present

## 2023-08-25 LAB — CUP PACEART REMOTE DEVICE CHECK
Battery Remaining Longevity: 92 mo
Battery Remaining Percentage: 75 %
Battery Voltage: 2.99 V
Brady Statistic AP VP Percent: 0 %
Brady Statistic AP VS Percent: 0 %
Brady Statistic AS VP Percent: 0 %
Brady Statistic AS VS Percent: 0 %
Brady Statistic RA Percent Paced: 1 %
Brady Statistic RV Percent Paced: 99 %
Date Time Interrogation Session: 20250120020014
Implantable Lead Connection Status: 753985
Implantable Lead Connection Status: 753985
Implantable Lead Implant Date: 20111128
Implantable Lead Implant Date: 20111128
Implantable Lead Location: 753859
Implantable Lead Location: 753860
Implantable Pulse Generator Implant Date: 20220722
Lead Channel Impedance Value: 430 Ohm
Lead Channel Impedance Value: 480 Ohm
Lead Channel Pacing Threshold Amplitude: 0.75 V
Lead Channel Pacing Threshold Pulse Width: 0.4 ms
Lead Channel Sensing Intrinsic Amplitude: 0.8 mV
Lead Channel Sensing Intrinsic Amplitude: 7.2 mV
Lead Channel Setting Pacing Amplitude: 1 V
Lead Channel Setting Pacing Amplitude: 2.5 V
Lead Channel Setting Pacing Pulse Width: 0.4 ms
Lead Channel Setting Sensing Sensitivity: 2.5 mV
Pulse Gen Model: 2272
Pulse Gen Serial Number: 3936953

## 2023-09-01 ENCOUNTER — Telehealth: Payer: Self-pay | Admitting: Internal Medicine

## 2023-09-01 MED ORDER — ISOSORBIDE MONONITRATE ER 30 MG PO TB24: 30.0000 mg | ORAL_TABLET | Freq: Every day | ORAL | 2 refills | Status: DC

## 2023-09-01 NOTE — Telephone Encounter (Signed)
*  STAT* If patient is at the pharmacy, call can be transferred to refill team.   1. Which medications need to be refilled? (please list name of each medication and dose if known)   isosorbide mononitrate (IMDUR) 30 MG 24 hr tablet    2. Which pharmacy/location (including street and city if local pharmacy) is medication to be sent to? [id number] - [place], [place] - [address]   3. Do they need a 30 day or 90 day supply? 90

## 2023-09-01 NOTE — Telephone Encounter (Signed)
Pt's medication was sent to pt's pharmacy as requested. Confirmation received.

## 2023-09-02 ENCOUNTER — Ambulatory Visit: Payer: Medicare Other | Admitting: Internal Medicine

## 2023-09-02 DIAGNOSIS — I442 Atrioventricular block, complete: Secondary | ICD-10-CM

## 2023-09-02 DIAGNOSIS — I493 Ventricular premature depolarization: Secondary | ICD-10-CM

## 2023-09-02 DIAGNOSIS — I4819 Other persistent atrial fibrillation: Secondary | ICD-10-CM

## 2023-09-02 DIAGNOSIS — Z950 Presence of cardiac pacemaker: Secondary | ICD-10-CM

## 2023-09-03 ENCOUNTER — Emergency Department (HOSPITAL_COMMUNITY): Payer: Medicare Other

## 2023-09-03 ENCOUNTER — Inpatient Hospital Stay (HOSPITAL_COMMUNITY)
Admission: EM | Admit: 2023-09-03 | Discharge: 2023-09-10 | DRG: 054 | Disposition: A | Discharge status: Hospice | Payer: Medicare Other | Attending: Family Medicine | Admitting: Family Medicine

## 2023-09-03 ENCOUNTER — Other Ambulatory Visit: Payer: Self-pay

## 2023-09-03 ENCOUNTER — Encounter (HOSPITAL_COMMUNITY): Payer: Self-pay

## 2023-09-03 DIAGNOSIS — Y92009 Unspecified place in unspecified non-institutional (private) residence as the place of occurrence of the external cause: Secondary | ICD-10-CM

## 2023-09-03 DIAGNOSIS — Z515 Encounter for palliative care: Secondary | ICD-10-CM

## 2023-09-03 DIAGNOSIS — Z91018 Allergy to other foods: Secondary | ICD-10-CM

## 2023-09-03 DIAGNOSIS — F17210 Nicotine dependence, cigarettes, uncomplicated: Secondary | ICD-10-CM | POA: Diagnosis present

## 2023-09-03 DIAGNOSIS — Z66 Do not resuscitate: Secondary | ICD-10-CM | POA: Diagnosis not present

## 2023-09-03 DIAGNOSIS — E785 Hyperlipidemia, unspecified: Secondary | ICD-10-CM | POA: Diagnosis present

## 2023-09-03 DIAGNOSIS — H3530 Unspecified macular degeneration: Secondary | ICD-10-CM | POA: Diagnosis present

## 2023-09-03 DIAGNOSIS — M25511 Pain in right shoulder: Secondary | ICD-10-CM | POA: Diagnosis not present

## 2023-09-03 DIAGNOSIS — M4317 Spondylolisthesis, lumbosacral region: Secondary | ICD-10-CM | POA: Diagnosis present

## 2023-09-03 DIAGNOSIS — Z91012 Allergy to eggs: Secondary | ICD-10-CM

## 2023-09-03 DIAGNOSIS — M25551 Pain in right hip: Secondary | ICD-10-CM | POA: Diagnosis present

## 2023-09-03 DIAGNOSIS — E1140 Type 2 diabetes mellitus with diabetic neuropathy, unspecified: Secondary | ICD-10-CM | POA: Diagnosis present

## 2023-09-03 DIAGNOSIS — G8929 Other chronic pain: Secondary | ICD-10-CM | POA: Diagnosis present

## 2023-09-03 DIAGNOSIS — R296 Repeated falls: Secondary | ICD-10-CM | POA: Diagnosis present

## 2023-09-03 DIAGNOSIS — S2239XA Fracture of one rib, unspecified side, initial encounter for closed fracture: Secondary | ICD-10-CM | POA: Insufficient documentation

## 2023-09-03 DIAGNOSIS — Z923 Personal history of irradiation: Secondary | ICD-10-CM

## 2023-09-03 DIAGNOSIS — R937 Abnormal findings on diagnostic imaging of other parts of musculoskeletal system: Secondary | ICD-10-CM | POA: Diagnosis not present

## 2023-09-03 DIAGNOSIS — D72829 Elevated white blood cell count, unspecified: Secondary | ICD-10-CM | POA: Diagnosis present

## 2023-09-03 DIAGNOSIS — J449 Chronic obstructive pulmonary disease, unspecified: Secondary | ICD-10-CM | POA: Diagnosis present

## 2023-09-03 DIAGNOSIS — F0390 Unspecified dementia without behavioral disturbance: Secondary | ICD-10-CM | POA: Diagnosis present

## 2023-09-03 DIAGNOSIS — Z79899 Other long term (current) drug therapy: Secondary | ICD-10-CM

## 2023-09-03 DIAGNOSIS — S81012A Laceration without foreign body, left knee, initial encounter: Secondary | ICD-10-CM | POA: Diagnosis present

## 2023-09-03 DIAGNOSIS — I442 Atrioventricular block, complete: Secondary | ICD-10-CM | POA: Diagnosis present

## 2023-09-03 DIAGNOSIS — S0191XA Laceration without foreign body of unspecified part of head, initial encounter: Secondary | ICD-10-CM | POA: Diagnosis not present

## 2023-09-03 DIAGNOSIS — I4891 Unspecified atrial fibrillation: Secondary | ICD-10-CM | POA: Diagnosis not present

## 2023-09-03 DIAGNOSIS — N179 Acute kidney failure, unspecified: Secondary | ICD-10-CM | POA: Diagnosis present

## 2023-09-03 DIAGNOSIS — J439 Emphysema, unspecified: Secondary | ICD-10-CM | POA: Diagnosis present

## 2023-09-03 DIAGNOSIS — I429 Cardiomyopathy, unspecified: Secondary | ICD-10-CM | POA: Diagnosis present

## 2023-09-03 DIAGNOSIS — W19XXXA Unspecified fall, initial encounter: Secondary | ICD-10-CM | POA: Diagnosis not present

## 2023-09-03 DIAGNOSIS — H9193 Unspecified hearing loss, bilateral: Secondary | ICD-10-CM | POA: Diagnosis present

## 2023-09-03 DIAGNOSIS — Z86718 Personal history of other venous thrombosis and embolism: Secondary | ICD-10-CM

## 2023-09-03 DIAGNOSIS — R9089 Other abnormal findings on diagnostic imaging of central nervous system: Secondary | ICD-10-CM | POA: Diagnosis present

## 2023-09-03 DIAGNOSIS — R9401 Abnormal electroencephalogram [EEG]: Secondary | ICD-10-CM | POA: Diagnosis present

## 2023-09-03 DIAGNOSIS — I129 Hypertensive chronic kidney disease with stage 1 through stage 4 chronic kidney disease, or unspecified chronic kidney disease: Secondary | ICD-10-CM | POA: Diagnosis present

## 2023-09-03 DIAGNOSIS — R918 Other nonspecific abnormal finding of lung field: Secondary | ICD-10-CM | POA: Diagnosis not present

## 2023-09-03 DIAGNOSIS — T148XXA Other injury of unspecified body region, initial encounter: Secondary | ICD-10-CM | POA: Diagnosis present

## 2023-09-03 DIAGNOSIS — G9340 Encephalopathy, unspecified: Secondary | ICD-10-CM | POA: Diagnosis present

## 2023-09-03 DIAGNOSIS — E1122 Type 2 diabetes mellitus with diabetic chronic kidney disease: Secondary | ICD-10-CM | POA: Diagnosis present

## 2023-09-03 DIAGNOSIS — C7931 Secondary malignant neoplasm of brain: Principal | ICD-10-CM | POA: Diagnosis present

## 2023-09-03 DIAGNOSIS — I493 Ventricular premature depolarization: Secondary | ICD-10-CM | POA: Diagnosis present

## 2023-09-03 DIAGNOSIS — R569 Unspecified convulsions: Secondary | ICD-10-CM | POA: Diagnosis not present

## 2023-09-03 DIAGNOSIS — G936 Cerebral edema: Secondary | ICD-10-CM | POA: Diagnosis present

## 2023-09-03 DIAGNOSIS — Z8249 Family history of ischemic heart disease and other diseases of the circulatory system: Secondary | ICD-10-CM

## 2023-09-03 DIAGNOSIS — R55 Syncope and collapse: Secondary | ICD-10-CM | POA: Diagnosis present

## 2023-09-03 DIAGNOSIS — S61412A Laceration without foreign body of left hand, initial encounter: Secondary | ICD-10-CM | POA: Diagnosis present

## 2023-09-03 DIAGNOSIS — R29810 Facial weakness: Secondary | ICD-10-CM | POA: Diagnosis present

## 2023-09-03 DIAGNOSIS — G9389 Other specified disorders of brain: Secondary | ICD-10-CM

## 2023-09-03 DIAGNOSIS — W010XXA Fall on same level from slipping, tripping and stumbling without subsequent striking against object, initial encounter: Secondary | ICD-10-CM | POA: Diagnosis present

## 2023-09-03 DIAGNOSIS — Z950 Presence of cardiac pacemaker: Secondary | ICD-10-CM

## 2023-09-03 DIAGNOSIS — Z7189 Other specified counseling: Secondary | ICD-10-CM | POA: Diagnosis not present

## 2023-09-03 DIAGNOSIS — S0101XA Laceration without foreign body of scalp, initial encounter: Secondary | ICD-10-CM | POA: Diagnosis present

## 2023-09-03 DIAGNOSIS — Z7901 Long term (current) use of anticoagulants: Secondary | ICD-10-CM

## 2023-09-03 DIAGNOSIS — E1165 Type 2 diabetes mellitus with hyperglycemia: Secondary | ICD-10-CM | POA: Diagnosis present

## 2023-09-03 DIAGNOSIS — S2232XA Fracture of one rib, left side, initial encounter for closed fracture: Secondary | ICD-10-CM | POA: Diagnosis present

## 2023-09-03 DIAGNOSIS — N182 Chronic kidney disease, stage 2 (mild): Secondary | ICD-10-CM | POA: Diagnosis present

## 2023-09-03 DIAGNOSIS — Z85828 Personal history of other malignant neoplasm of skin: Secondary | ICD-10-CM

## 2023-09-03 DIAGNOSIS — I4819 Other persistent atrial fibrillation: Secondary | ICD-10-CM | POA: Diagnosis present

## 2023-09-03 DIAGNOSIS — Z888 Allergy status to other drugs, medicaments and biological substances status: Secondary | ICD-10-CM

## 2023-09-03 DIAGNOSIS — M24521 Contracture, right elbow: Secondary | ICD-10-CM | POA: Diagnosis present

## 2023-09-03 LAB — CBC WITH DIFFERENTIAL/PLATELET
Abs Immature Granulocytes: 0.12 10*3/uL — ABNORMAL HIGH (ref 0.00–0.07)
Basophils Absolute: 0.1 10*3/uL (ref 0.0–0.1)
Basophils Relative: 0 %
Eosinophils Absolute: 0.2 10*3/uL (ref 0.0–0.5)
Eosinophils Relative: 1 %
HCT: 39.8 % (ref 39.0–52.0)
Hemoglobin: 13.4 g/dL (ref 13.0–17.0)
Immature Granulocytes: 1 %
Lymphocytes Relative: 10 %
Lymphs Abs: 1.4 10*3/uL (ref 0.7–4.0)
MCH: 31.2 pg (ref 26.0–34.0)
MCHC: 33.7 g/dL (ref 30.0–36.0)
MCV: 92.8 fL (ref 80.0–100.0)
Monocytes Absolute: 1.5 10*3/uL — ABNORMAL HIGH (ref 0.1–1.0)
Monocytes Relative: 11 %
Neutro Abs: 10.3 10*3/uL — ABNORMAL HIGH (ref 1.7–7.7)
Neutrophils Relative %: 77 %
Platelets: 246 10*3/uL (ref 150–400)
RBC: 4.29 MIL/uL (ref 4.22–5.81)
RDW: 13.8 % (ref 11.5–15.5)
WBC: 13.5 10*3/uL — ABNORMAL HIGH (ref 4.0–10.5)
nRBC: 0 % (ref 0.0–0.2)

## 2023-09-03 LAB — URINALYSIS, W/ REFLEX TO CULTURE (INFECTION SUSPECTED)
Bilirubin Urine: NEGATIVE
Glucose, UA: NEGATIVE mg/dL
Ketones, ur: NEGATIVE mg/dL
Leukocytes,Ua: NEGATIVE
Nitrite: NEGATIVE
Protein, ur: NEGATIVE mg/dL
Specific Gravity, Urine: 1.043 — ABNORMAL HIGH (ref 1.005–1.030)
pH: 6 (ref 5.0–8.0)

## 2023-09-03 LAB — COMPREHENSIVE METABOLIC PANEL
ALT: 20 U/L (ref 0–44)
AST: 34 U/L (ref 15–41)
Albumin: 3.3 g/dL — ABNORMAL LOW (ref 3.5–5.0)
Alkaline Phosphatase: 39 U/L (ref 38–126)
Anion gap: 11 (ref 5–15)
BUN: 14 mg/dL (ref 8–23)
CO2: 19 mmol/L — ABNORMAL LOW (ref 22–32)
Calcium: 8.6 mg/dL — ABNORMAL LOW (ref 8.9–10.3)
Chloride: 105 mmol/L (ref 98–111)
Creatinine, Ser: 1.26 mg/dL — ABNORMAL HIGH (ref 0.61–1.24)
GFR, Estimated: 55 mL/min — ABNORMAL LOW (ref >60)
Glucose, Bld: 161 mg/dL — ABNORMAL HIGH (ref 70–99)
Potassium: 4.2 mmol/L (ref 3.5–5.1)
Sodium: 135 mmol/L (ref 135–145)
Total Bilirubin: 0.9 mg/dL (ref 0.0–1.2)
Total Protein: 5.9 g/dL — ABNORMAL LOW (ref 6.5–8.1)

## 2023-09-03 LAB — I-STAT CG4 LACTIC ACID, ED: Lactic Acid, Venous: 2.4 mmol/L (ref 0.5–1.9)

## 2023-09-03 MED ORDER — VITAMIN C 500 MG PO TABS: 1000.0000 mg | ORAL_TABLET | Freq: Every day | ORAL | Status: DC

## 2023-09-03 MED ORDER — IPRATROPIUM-ALBUTEROL 0.5-2.5 (3) MG/3ML IN SOLN
3.0000 mL | Freq: Four times a day (QID) | RESPIRATORY_TRACT | Status: DC | PRN
  Administered 2023-09-08: 3 mL via RESPIRATORY_TRACT
  Filled 2023-09-03: qty 3

## 2023-09-03 MED ORDER — IOHEXOL 350 MG/ML SOLN
75.0000 mL | Freq: Once | INTRAVENOUS | Status: AC | PRN
  Administered 2023-09-03: 75 mL via INTRAVENOUS

## 2023-09-03 MED ORDER — APIXABAN 5 MG PO TABS: 5.0000 mg | ORAL_TABLET | Freq: Two times a day (BID) | ORAL | Status: DC

## 2023-09-03 MED ORDER — ISOSORBIDE MONONITRATE ER 30 MG PO TB24
30.0000 mg | ORAL_TABLET | Freq: Every day | ORAL | Status: DC
  Administered 2023-09-03 – 2023-09-10 (×6): 30 mg via ORAL
  Filled 2023-09-03 (×7): qty 1

## 2023-09-03 MED ORDER — COQ10 100 MG PO CAPS: 300.0000 mg | ORAL_CAPSULE | Freq: Every day | ORAL | Status: DC

## 2023-09-03 MED ORDER — SIMVASTATIN 20 MG PO TABS
10.0000 mg | ORAL_TABLET | Freq: Every day | ORAL | Status: DC
  Administered 2023-09-05 – 2023-09-10 (×6): 10 mg via ORAL
  Filled 2023-09-03 (×6): qty 1

## 2023-09-03 MED ORDER — FENOFIBRATE 54 MG PO TABS
54.0000 mg | ORAL_TABLET | Freq: Every day | ORAL | Status: DC
  Administered 2023-09-06 – 2023-09-10 (×5): 54 mg via ORAL
  Filled 2023-09-03 (×6): qty 1

## 2023-09-03 MED ORDER — ACETAMINOPHEN 325 MG PO TABS
650.0000 mg | ORAL_TABLET | Freq: Four times a day (QID) | ORAL | Status: DC | PRN
  Administered 2023-09-03 – 2023-09-07 (×3): 650 mg via ORAL
  Filled 2023-09-03 (×3): qty 2

## 2023-09-03 NOTE — Assessment & Plan Note (Addendum)
CT pelvis: No acute intrathoracic, intra-abdominal, intrapelvic traumatic injury. Painful on examination without overt deformities or abnormalities. Limited ROM. PT/OT to assess.

## 2023-09-03 NOTE — ED Provider Notes (Signed)
[place] EMERGENCY DEPARTMENT AT [facility] Provider Note   CSN: [id number] Arrival date & time: 09/03/23  1259     History  Chief Complaint  Patient presents with   Gerald Maize Mauri Tolen. is a 89 y.o. male.  Patient here after mechanical fall at home.  He was using his walker when his leg gave out and he fell hit the right side of his head.  He is on Eliquis for pacemaker, CKD, COPD.  He denies any extremity pain.  Denies any headache or neck pain.  He has been having a couple falls here recently.  He denies loss of consciousness.  Level 2 trauma as he does have head injury on Eliquis.  He denies any weakness numbness tingling.  No nausea vomit diarrhea.  The history is provided by the patient.       Home Medications Prior to Admission medications   Medication Sig Start Date End Date Taking? Authorizing Provider  acetaminophen (TYLENOL) 500 MG tablet Take 1,000 mg by mouth daily as needed for moderate pain (pain score 4-6).   Yes [provider]  apixaban (ELIQUIS) 5 MG TABS tablet Take 1 tablet (5 mg total) by mouth 2 (two) times daily. 03/24/23  Yes Duke Salvia, MD  Ascorbic Acid (VITAMIN C) 1000 MG tablet Take 1,000 mg by mouth daily.   Yes [provider]  b complex vitamins tablet Take 1 tablet by mouth at bedtime.   Yes [provider]  chlorpheniramine (CHLOR-TRIMETON) 4 MG tablet Take 4 mg by mouth daily before breakfast.   Yes [provider]  Coenzyme Q10 (COQ10) 100 MG CAPS Take 300 mg by mouth daily after breakfast.   Yes [provider]  fenofibrate (TRICOR) 145 MG tablet Take 145 mg by mouth at bedtime.  1/2 tab daily   Yes [provider]  isosorbide mononitrate (IMDUR) 30 MG 24 hr tablet Take 1 tablet (30 mg total) by mouth daily. 09/01/23  Yes Duke Salvia, MD  Lutein 20 MG TABS Take 20 mg by mouth daily after breakfast.   Yes [provider]  Niacin (NICOTINIC ACID PO) Take 250  mg by mouth daily. "DO NOT GIVE NIACIN"   Yes [provider]  repaglinide (PRANDIN) 2 MG tablet Take 2 mg by mouth. 15 minutes before each meal   Yes [provider]  simvastatin (ZOCOR) 10 MG tablet Take 10 mg by mouth See admin instructions. Must take after supper "Must Have AURA-Binda Brand " made in [place] ONLY 06/07/11  Yes Duke Salvia, MD  SODIUM BICARBONATE PO Take 162-165 mg by mouth at bedtime.   Yes [provider]  trandolapril-verapamil (TARKA) 4-240 MG per tablet Take 0.5 tablets by mouth at bedtime. 12/11/10  Yes Duke Salvia, MD  UNABLE TO FIND Take 20 mg by mouth daily after breakfast. Zeaxanthin   Yes [provider]  VITAMIN A PO Take 2,300 mcg by mouth daily after breakfast.   Yes [provider]  Vitamin D3 (VITAMIN D) 25 MCG tablet Take 1,000 Units by mouth every other day.   Yes [provider]  COVID-19 mRNA Vac-TriS, Pfizer, (PFIZER-BIONT COVID-19 VAC-TRIS) SUSP injection Inject into the muscle. 11/14/20   Judyann Munson, MD  ferrous sulfate 325 (65 FE) MG tablet Take 325 mg by mouth 3 (three) times a week. Patient not taking: Reported on 09/03/2023    [provider]  GLUCOSAMINE-CHONDROITIN PO Take 2 capsules by  mouth daily after breakfast. 600/1200 mg    [provider]  Lactobacillus (ACIDOPHILUS) CAPS Take 2 capsules by mouth 3 (three) times daily. 5-8 minutes before each meal with 1/2 cup of caffeine coffee Probiotic 100 Million Patient not taking: Reported on 09/03/2023    [provider]      Allergies    Other, Chicken meat (diagnostic), and Egg-derived products    Review of Systems   Review of Systems  Physical Exam Updated Vital Signs BP (!) 142/65   Pulse 67   Temp 98.1 F (36.7 C) (Oral)   Resp (!) 25   Ht 5\' 11"  (1.803 m)   Wt 102.1 kg   SpO2 96%   BMI 31.38 kg/m  Physical Exam Vitals and nursing note reviewed.  Constitutional:      General: He is not in  acute distress.    Appearance: He is well-developed. He is not ill-appearing.  HENT:     Head: Normocephalic and atraumatic.     Nose: Nose normal.     Mouth/Throat:     Mouth: Mucous membranes are moist.  Eyes:     Extraocular Movements: Extraocular movements intact.     Conjunctiva/sclera: Conjunctivae normal.     Pupils: Pupils are equal, round, and reactive to light.  Cardiovascular:     Rate and Rhythm: Normal rate and regular rhythm.     Pulses: Normal pulses.     Heart sounds: Normal heart sounds. No murmur heard. Pulmonary:     Effort: Pulmonary effort is normal. No respiratory distress.     Breath sounds: Normal breath sounds.  Abdominal:     Palpations: Abdomen is soft.     Tenderness: There is no abdominal tenderness.  Musculoskeletal:        General: No swelling. Normal range of motion.     Cervical back: Normal range of motion and neck supple.  Skin:    General: Skin is warm and dry.     Capillary Refill: Capillary refill takes less than 2 seconds.     Comments: Skin tear to his left hand, skin tear to his left knee, 4 cm superficial laceration to the right scalp  Neurological:     General: No focal deficit present.     Mental Status: He is alert and oriented to person, place, and time.     Cranial Nerves: No cranial nerve deficit.     Sensory: No sensory deficit.     Motor: No weakness.     Coordination: Coordination normal.  Psychiatric:        Mood and Affect: Mood normal.     ED Results / Procedures / Treatments   Labs (all labs ordered are listed, but only abnormal results are displayed) Labs Reviewed  CBC WITH DIFFERENTIAL/PLATELET - Abnormal; Notable for the following components:      Result Value   WBC 13.5 (*)    Neutro Abs 10.3 (*)    Monocytes Absolute 1.5 (*)    Abs Immature Granulocytes 0.12 (*)    All other components within normal limits  COMPREHENSIVE METABOLIC PANEL - Abnormal; Notable for the following components:   CO2 19 (*)     Glucose, Bld 161 (*)    Creatinine, Ser 1.26 (*)    Calcium 8.6 (*)    Total Protein 5.9 (*)    Albumin 3.3 (*)    GFR, Estimated 55 (*)    All other components within normal limits  I-STAT CG4 LACTIC ACID, ED -  Abnormal; Notable for the following components:   Lactic Acid, Venous 2.4 (*)    All other components within normal limits    EKG None  Radiology DG Pelvis Portable Result Date: 09/03/2023 CLINICAL DATA:  Fall. EXAM: PORTABLE PELVIS 1-2 VIEWS COMPARISON:  None Available. FINDINGS: Subtle cortical irregularity of the left iliopectineal line, near the level of the left proximal superior pubic ramus. No evidence of diastasis. The femoral heads are seated within the acetabula. The sacroiliac joints and pubic symphysis appear anatomically aligned. Mild-to-moderate degenerative changes of the left-greater-than-right hips. Vascular calcifications are noted. IMPRESSION: Subtle cortical irregularity of the left iliopectineal line, near the level of the left proximal superior pubic ramus, is equivocal for fracture. Consider further evaluation with CT of the pelvis. Electronically Signed   By: Hart Robinsons M.D.   On: 09/03/2023 14:54   DG Hand Complete Left Result Date: 09/03/2023 CLINICAL DATA:  Fall and trauma to the left hand. EXAM: LEFT HAND - COMPLETE 3+ VIEW COMPARISON:  None Available. FINDINGS: No acute fracture or dislocation. The bones are osteopenic. The soft tissues are unremarkable. IMPRESSION: 1. No acute fracture or dislocation. 2. Osteopenia. Electronically Signed   By: Elgie Collard M.D.   On: 09/03/2023 14:53   DG Knee Complete 4 Views Left Result Date: 09/03/2023 CLINICAL DATA:  Fall and trauma to the left knee.  Pain. EXAM: LEFT KNEE - COMPLETE 4+ VIEW COMPARISON:  None Available. FINDINGS: There is no acute fracture or dislocation. The bones are osteopenic. There is mild arthritic changes with spurring. No significant joint effusion. The soft tissues are unremarkable.  Small focus of calcification in the soft tissues of the posterior knee. IMPRESSION: 1. No acute fracture or dislocation. 2. Mild arthritic changes. Electronically Signed   By: Elgie Collard M.D.   On: 09/03/2023 14:52   DG Chest Portable 1 View Result Date: 09/03/2023 CLINICAL DATA:  Fall. EXAM: PORTABLE CHEST 1 VIEW COMPARISON:  Chest radiograph dated July 03, 2010. FINDINGS: The heart size and mediastinal contours are within normal limits. Right subclavian AICD in place. Aortic atherosclerosis. Chronic appearing bilateral interstitial changes, most pronounced at the lung bases. Mild left basilar atelectasis/scarring. No focal consolidation, sizeable pleural effusion, or pneumothorax. Diffuse osseous demineralization. Subtle cortical irregularity of the left lateral sixth rib. IMPRESSION: 1. Subtle cortical irregularity of the left lateral sixth rib could represent a fracture. Recommend correlation with point tenderness. No pneumothorax. 2. Chronic appearing bilateral interstitial changes, most pronounced at the lung bases. Electronically Signed   By: Hart Robinsons M.D.   On: 09/03/2023 14:46    Procedures .Laceration Repair  Date/Time: 09/03/2023 1:20 PM  Performed by: Virgina Norfolk, DO Authorized by: Virgina Norfolk, DO   Consent:    Consent obtained:  Verbal   Consent given by:  Patient   Risks, benefits, and alternatives were discussed: yes     Risks discussed:  Infection, need for additional repair, nerve damage, poor wound healing, poor cosmetic result, pain, retained foreign body, vascular damage and tendon damage   Alternatives discussed:  No treatment Universal protocol:    Procedure explained and questions answered to patient or proxy's satisfaction: yes     Relevant documents present and verified: yes     Patient identity confirmed:  Verbally with patient Anesthesia:    Anesthesia method:  None Laceration details:    Location:  Scalp   Scalp location:  R temporal    Length (cm):  4   Depth (mm):  1 Pre-procedure details:  Preparation:  Patient was prepped and draped in usual sterile fashion Exploration:    Wound exploration: wound explored through full range of motion and entire depth of wound visualized     Wound extent: areolar tissue not violated, fascia not violated, no foreign body, no signs of injury, no tendon damage, no underlying fracture and no vascular damage   Treatment:    Area cleansed with:  Saline   Amount of cleaning:  Standard   Irrigation solution:  Sterile saline   Irrigation volume:  500 cc   Debridement:  None   Undermining:  None Skin repair:    Repair method:  Tissue adhesive and Steri-Strips   Number of Steri-Strips:  4 Approximation:    Approximation:  Close Repair type:    Repair type:  Simple Post-procedure details:    Dressing:  Open (no dressing)     Medications Ordered in ED Medications - No data to display  ED Course/ Medical Decision Making/ A&P                                 Medical Decision Making Amount and/or Complexity of Data Reviewed Labs: ordered. Radiology: ordered.   [name]. here after mechanical fall.  History of pacemaker on Eliquis.  Hit the right side of his head with small laceration that was repaired with Dermabond.  He has a history of hypertension high cholesterol CKD.  He is got a couple falls this week.  He has been having some falls with his walker.  He is got skin tear to his left knee left hand with a laceration to the right side of his forehead.  He is a level 2 trauma upon arrival given that he is on blood thinners.  Otherwise he appears well.  He lives with his wife and grandson.  Illogically intact.  Is not having any shortness of breath chest pain neck pain.  Overall chest x-ray shows may be sixth rib fracture was not particularly tender in this area.  No pneumothorax.  Lab work thus far unremarkable.  No significant leukocytosis or anemia.  Patient awaiting  remaining CT scan of his head neck x-ray of his left hand and left knee metabolic panel.  He is not having any major pain at this time.  He feels safe for discharge with walker.  Overall x-ray of the pelvis cannot fully rule out fracture of the pelvis.  Will get a CT scan of the chest abdomen pelvis to further evaluate for traumatic processes given rib fracture possible pelvic fracture.  Handed off to oncoming ED staff with patient pending CT scans.  This chart was dictated using voice recognition software.  Despite best efforts to proofread,  errors can occur which can change the documentation meaning.         Final Clinical Impression(s) / ED Diagnoses Final diagnoses:  Fall, initial encounter    Rx / DC Orders ED Discharge Orders     None         Virgina Norfolk, DO 09/03/23 1513

## 2023-09-03 NOTE — Assessment & Plan Note (Signed)
"  Multiple pulmonary nodules. Most significant: Right solid pulmonary nodule measuring 12 mm. Consider a non-contrast Chest CT at 3 months, a PET/CT, or tissue sampling."

## 2023-09-03 NOTE — ED Notes (Signed)
X-ray at bedside

## 2023-09-03 NOTE — Assessment & Plan Note (Signed)
CHRONIC limitation to right hand and upper extremity with history of squamous cell CA and prior XRT/surgery. Acute on chronic right shoulder pain. CT shows degenerative changes of b/l shoulders, may require dedicated imaging. No acute deformities or abnormalities.

## 2023-09-03 NOTE — Assessment & Plan Note (Signed)
Dermabond from EDP.

## 2023-09-03 NOTE — H&P (Cosign Needed Addendum)
Hospital Admission History and Physical Service Pager: [phone]  Patient name: Gerald Hardin Medical record number: [id number] Date of Birth: 02-Feb-1934 Age: 89 y.o. Gender: male  Primary Care Provider: Patient, No Pcp Per Consultants: None - may need neuro input  Code Status: FULL  Preferred Emergency Contact:   Lueck,Betty-[phone]  Chief Complaint: Multiple Falls   Assessment and Plan: Gerald Talerico. is a 89 y.o. male presenting after several falls (2-3, differs between family and patient recollection. Differential for presentation of this includes:  Ddx Fall:  Fall from environmental factors: Most likely cause given history provided from the patient.  However, will not rule out other differentials as he is a somewhat poor historian.  Patient does report that he tripped and fell over something his first fall and then subsequent falls were induced by injuries sustained from his first fall Syncope: Patient denies any syncopal event during these episodes. Seizure: No evidence of seizure-like activity, patient denies ever losing consciousness which is verified by family members at bedside. Stroke: CT head remarkable for "extensive vasogenic edema posteriorly in both cerebral hemispheres with evidence of underlying masses" without evidence of stroke.  However MRI of the brain ordered. Metabolic: No evidence of hypoglycemia on lab work or other metabolic causes Medication Induced: Patient does take a multitude of different vitamins that I am unsure of the side effects.  However not on any overtly sedating medications. Peripheral Neuropathy: Denies this Cardiac Arrhythmia  Valvular disorders: Possible although patient denies syncopal event during these falls, telemetry monitoring on. Other Intracranial Cause: See CT findings above.  This may be contributing to his balance although he declines difficulty with balance.  MRI pending.  Assessment & Plan Fall See differential  above, awaiting brain MR with PT/OT ordered and telemetry monitoring. Anticipate neurology input after MR brain. Echo pending.  - Admit to FMTS, Attending Dr. Manson Passey  - CK, CBC, BMP, Mag pending  - PT/OT - MR brain in AM  - Cardiac Tele - Interrogate pacer  - Fall precautions  - vte ppx with Eliquis (CT negative for bleed) - regular diet  - Echo  Abnormal brain CT extensive vasogenic edema posteriorly in both cerebral hemispheres with evidence of underlying masses. Not seen on CT from 2016. Further differentiation with MR. No focal deficits noted on my exam. Monitor clinically overnight.  Superficial abrasion Multiple superficial abrasions. Monitor and keep clean. On AC.   Right hip pain CT pelvis: No acute intrathoracic, intra-abdominal, intrapelvic traumatic injury. Painful on examination without overt deformities or abnormalities. Limited ROM. PT/OT to assess.  Right shoulder pain CHRONIC limitation to right hand and upper extremity with history of squamous cell CA and prior XRT/surgery. Acute on chronic right shoulder pain. CT shows degenerative changes of b/l shoulders, may require dedicated imaging. No acute deformities or abnormalities.  Medication management Multiple vitamins & OTC medications used at home: B-Complex, Glucosamine & Chondroitin complex, CoQ10, Lutein, Niacin, Zeaxanthin, Vit A PO, Vit D3. Check Vitamin levels in AM. Holding most for now, patient very particular about his medications. Does not want them to leave his room.  Rib fracture Left sixth rib fracture. Symptomatic management. IS and pulm toiletry to prevent atelectasis.  Laceration of head Dermabond from EDP.  Abnormal CT scan, lumbar spine "Bilateral L4 and L5 pars interarticularis defects with grade 1 anterolisthesis of L5 on S1." Questionable of this contribution to falls. Not a good surgical candidate if this was even indicated. Does have right lower extremity  limitations but this happened after the fall,  localized to the hip. Dedicated MR would be difficult given PPM but will discuss with day team.  Pulmonary nodules "Multiple pulmonary nodules. Most significant: Right solid pulmonary nodule measuring 12 mm. Consider a non-contrast Chest CT at 3 months, a PET/CT, or tissue sampling."  Chronic and Stable Problems:  Complete Heart Block  Pacemaker (St. Jude): Attempt interrogation, last assessed 08/25/2023. "Scheduled remote reviewed. Normal device function.  Presenting AF, burden 100% since April 2024.  Known persistent AF, on Eliquis per Epic." Echocardiogram from 06/2010 demonstrated normal left ventricular function without wall motion abnormalities, mildly elevated pulmonary pressures.  HTN: trandolapril-verapamil 4-240 MG per tablet & Imdur 30 mg.  Restarted home Imdur.  However, do not have the equivalent to his dual medication.  Patient may prefer to take his home med over changing to formulary equivalent.  Will defer to day team to discuss. Afib: On Eliquis, restarted after confirmed no hemorrhage  PVCs: Interrogating PPM  COPD  Chronic Cough: Duonebs for cough as needed. IS and pulm toilet   FEN/GI: Regular diet VTE Prophylaxis: Eliquis - compliant at home   Disposition: Med tele   History of Present Illness:  Gerald Lienau. is a 89 y.o. male presenting after a fall, fell twice prior to coming into the ED. history provided by patient and wife as well as grandson who are both present in the room.  Patient reports that he had 3 falls over the last week or 2.  He does not remember hitting his head but reports that he must have because he has bruising over his eye.  Additionally, reports that he had no syncopal event prior to the fall but that he fell over something initially causing pain in his hip.  He was then ambulating with a walker and fell a second time although family denies knowing about this.  Patient fell a final time while getting out of bed this morning before coming into the  ED.  This prompted an ED visit.  Patient denies any other symptoms presently other than right hip pain and right shoulder pain.  He has a chronic limitation to the right hand and right shoulder with contractures secondary to XRT from squamous cell carcinoma.   Denies upper respiratory infection or symptoms.  Patient does have chronic COPD per chart review and has a chronic cough.  Not on any inhalers at home.  He has been taking his Eliquis every day twice a day.  Patient denies loss of consciousness after the falls.  Family reports that there were no progressive symptoms prior to falling, patient had not had difficulty with balance or worsening neurologic symptoms per their assessment.  Denies nausea, vomiting, diarrhea, abdominal pain, visual changes, urinary symptoms.  In the ED, patient presented as a level 2 trauma secondary to the fact that he is on Eliquis at home.  Was otherwise well on admission.  He had a chest x-ray that showed sixth rib fracture.  Lab work was overall unremarkable other than slight leukocytosis with left shift.  He did have a slight bump in creatinine but not enough for an AKI.  CT scans obtained of head, neck, chest, abdomen, pelvis, L-spine.  Review Of Systems: Per HPI with the following additions: See Above   Pertinent Past Medical History: Complete heart block with pacemaker placement, hypertension, A-fib, PVCs, COPD and chronic cough, history of SCC Remainder reviewed in history tab.   Pertinent Past Surgical History: Pacemaker placement SCC  excision and XRT Remainder reviewed in history tab.  Pertinent Social History: Tobacco use: Yes-3/4PPD x 50 years Alcohol use: None Other Substance use: None Lives with wife  Pertinent Family History: None pertinent   Remainder reviewed in history tab.   Important Outpatient Medications: B-Complex, Glucosamine & Chondroitin complex, CoQ10, Lutein, Niacin, Zeaxanthin, Vit A PO, Vit D3, trandolapril-verapamil  4-240 MG per tablet & Imdur 30 mg, Eliquis 5mg  BID, simvastatin, fenofibrate, vitamin C, Tylenol as needed for pain, chlorpheniramine, Prandin  Remainder reviewed in medication history.   Objective: BP 137/85 (BP Location: Left Arm)   Pulse 71   Temp 98 F (36.7 C) (Oral)   Resp 18   Ht 5\' 11"  (1.803 m)   Wt 102.1 kg   SpO2 100%   BMI 31.38 kg/m  Exam: General: No acute distress, answers questions appropriately with family at bedside, patient is hard of hearing, pleasant Eyes:  EOMI, PERRL.   Ears:  External ears WNL Nose:  Septum midline  Mouth:  MMM Neck: No lymphadenopathy or deformities noted Cardiovascular: Regular rate and rhythm, no murmurs/gallop/rub Respiratory: Clear to auscultation bilaterally, no focal diminishment or crackles/faint wheezes heard throughout Gastrointestinal: No tenderness to palpation, nondistended, soft, bowel sounds present MSK: See neuroexam below, contracted right upper extremity otherwise able to move all extremities Derm: Multiple superficial abrasions-right hand and left hand (photos available, left eye bruising noted, small laceration on the head-right scalp Neuro: CN II: PERRL CN III, IV,VI: EOMI CV V: Normal sensation in V1, V2, V3 CVII: Symmetric smile and brow raise CN VIII: Chronic hearing loss bilaterally CN XII: Symmetric tongue protrusion  UE normal on left, patient has chronic contractures on the right at the elbow and at the wrist.  Unable to assess strength of right lower extremity secondary to pain noted in the hip.  Left lower extremity at baseline strength. No facial droop, dysarthria Psych: Normal Affect and mood   Labs:  CBC BMET  Recent Labs  Lab 09/03/23 1315  WBC 13.5*  HGB 13.4  HCT 39.8  PLT 246   Recent Labs  Lab 09/03/23 1350  NA 135  K 4.2  CL 105  CO2 19*  BUN 14  CREATININE 1.26*  GLUCOSE 161*  CALCIUM 8.6*     UA with small hemoglobin otherwise negative, lactate 2.4 EKG:   Previous  comparison available, patient has known pacer, ventricular rate of 70, QTc 462, T wave elevations in the inferior leads that are similar to previous, no obvious ST elevation or inversion on assessment.  Overall unchanged from comparison to previous   Imaging Studies Performed:  Chest x-ray showing sixth rib fracture  CT head showing extensive vasogenic edema in both cerebral hemispheres with evidence of underlying masses.  Cervical CT without acute C-spine changes  CT chest showed multiple pulmonary nodules, no acute intrathoracic findings  Abdominal CT showed no acute intra-abdominal findings  Pelvic CT showed no traumatic pelvic injury, L-spine CT showed grade 1 anterolisthesis of L5 on S1 with associated bilateral L5 pars interarticularis defects.   Alfredo Martinez, MD 09/03/2023, 7:49 PM PGY-3, [facility]  FPTS Intern pager: [phone], text pages welcome Secure chat group [facility] Teaching Service

## 2023-09-03 NOTE — Assessment & Plan Note (Signed)
extensive vasogenic edema posteriorly in both cerebral hemispheres with evidence of underlying masses. Not seen on CT from 2016. Further differentiation with MR. No focal deficits noted on my exam. Monitor clinically overnight.

## 2023-09-03 NOTE — Assessment & Plan Note (Signed)
"  Bilateral L4 and L5 pars interarticularis defects with grade 1 anterolisthesis of L5 on S1." Questionable of this contribution to falls. Not a good surgical candidate if this was even indicated. Does have right lower extremity limitations but this happened after the fall, localized to the hip. Dedicated MR would be difficult given PPM but will discuss with day team.

## 2023-09-03 NOTE — Assessment & Plan Note (Signed)
Left sixth rib fracture. Symptomatic management. IS and pulm toiletry to prevent atelectasis.

## 2023-09-03 NOTE — ED Triage Notes (Signed)
Pt here for a fall, has had multiple over the last couple of days. Pt is on eliquis. Pt has left laceration to head and bruising around left eye and abrasions to left hand. VSS. Axox4.

## 2023-09-03 NOTE — ED Notes (Signed)
EDP at bedside

## 2023-09-03 NOTE — Assessment & Plan Note (Signed)
Multiple vitamins & OTC medications used at home: B-Complex, Glucosamine & Chondroitin complex, CoQ10, Lutein, Niacin, Zeaxanthin, Vit A PO, Vit D3. Check Vitamin levels in AM. Holding most for now, patient very particular about his medications. Does not want them to leave his room.

## 2023-09-03 NOTE — Progress Notes (Signed)
Orthopedic Tech Progress Note Patient Details:  Gerald Hardin Sep 29, 1933 [id number]  Level 2 trauma    Patient ID: Gerald Coupe., male   DOB: 1934-04-22, 89 y.o.   MRN: [id number]  Gerald Hardin 09/03/2023, 2:03 PM

## 2023-09-03 NOTE — ED Notes (Signed)
Pt transported to CT ?

## 2023-09-03 NOTE — ED Notes (Signed)
This paramedic travelled with patient to CT

## 2023-09-03 NOTE — Assessment & Plan Note (Signed)
Multiple superficial abrasions. Monitor and keep clean. On AC.

## 2023-09-03 NOTE — ED Provider Notes (Signed)
  Physical Exam  BP 137/85 (BP Location: Left Arm)   Pulse 71   Temp 98 F (36.7 C) (Oral)   Resp 18   Ht 5\' 11"  (1.803 m)   Wt 102.1 kg   SpO2 100%   BMI 31.38 kg/m   Physical Exam  Procedures  Procedures  ED Course / MDM    Received care of patient from Dr. Lockie Mola. Please see prior history, physical and exam.  CT evaluated by me and radiology shows vasogenic edema, possible underlying masses.  CT also shows left rib fracture, pulmonary nodules, prostatomegaly, emphysema, grade 1 anterolisthesis.    Will admit given difficulty ambulating, CT findings, will need MRI in the AM due to pacemaker.  Discussed results with patient and wife on phone.  Admitted for further care.       Alvira Monday, MD 09/06/23 2215

## 2023-09-03 NOTE — Assessment & Plan Note (Signed)
See differential above, awaiting brain MR with PT/OT ordered and telemetry monitoring. Anticipate neurology input after MR brain. Echo pending.  - Admit to FMTS, Attending Dr. Manson Passey  - CK, CBC, BMP, Mag pending  - PT/OT - MR brain in AM  - Cardiac Tele - Interrogate pacer  - Fall precautions  - vte ppx with Eliquis (CT negative for bleed) - regular diet  - Echo

## 2023-09-04 ENCOUNTER — Inpatient Hospital Stay (HOSPITAL_COMMUNITY): Payer: Medicare Other

## 2023-09-04 ENCOUNTER — Other Ambulatory Visit (HOSPITAL_COMMUNITY): Payer: Medicare Other

## 2023-09-04 DIAGNOSIS — R9089 Other abnormal findings on diagnostic imaging of central nervous system: Secondary | ICD-10-CM | POA: Diagnosis not present

## 2023-09-04 DIAGNOSIS — S0191XA Laceration without foreign body of unspecified part of head, initial encounter: Secondary | ICD-10-CM

## 2023-09-04 DIAGNOSIS — W19XXXA Unspecified fall, initial encounter: Secondary | ICD-10-CM | POA: Diagnosis not present

## 2023-09-04 LAB — BASIC METABOLIC PANEL
Anion gap: 13 (ref 5–15)
BUN: 12 mg/dL (ref 8–23)
CO2: 20 mmol/L — ABNORMAL LOW (ref 22–32)
Calcium: 8.7 mg/dL — ABNORMAL LOW (ref 8.9–10.3)
Chloride: 104 mmol/L (ref 98–111)
Creatinine, Ser: 1.02 mg/dL (ref 0.61–1.24)
GFR, Estimated: >60 mL/min (ref >60)
Glucose, Bld: 132 mg/dL — ABNORMAL HIGH (ref 70–99)
Potassium: 4 mmol/L (ref 3.5–5.1)
Sodium: 137 mmol/L (ref 135–145)

## 2023-09-04 LAB — CBC
HCT: 39.5 % (ref 39.0–52.0)
Hemoglobin: 13 g/dL (ref 13.0–17.0)
MCH: 30.9 pg (ref 26.0–34.0)
MCHC: 32.9 g/dL (ref 30.0–36.0)
MCV: 93.8 fL (ref 80.0–100.0)
Platelets: 197 10*3/uL (ref 150–400)
RBC: 4.21 MIL/uL — ABNORMAL LOW (ref 4.22–5.81)
RDW: 13.5 % (ref 11.5–15.5)
WBC: 12.1 10*3/uL — ABNORMAL HIGH (ref 4.0–10.5)
nRBC: 0 % (ref 0.0–0.2)

## 2023-09-04 LAB — LACTIC ACID, PLASMA: Lactic Acid, Venous: 1.6 mmol/L (ref 0.5–1.9)

## 2023-09-04 LAB — CK: Total CK: 30 U/L — ABNORMAL LOW (ref 49–397)

## 2023-09-04 LAB — MAGNESIUM: Magnesium: 1.9 mg/dL (ref 1.7–2.4)

## 2023-09-04 LAB — VITAMIN B12: Vitamin B-12: 776 pg/mL (ref 180–914)

## 2023-09-04 LAB — VITAMIN D 25 HYDROXY (VIT D DEFICIENCY, FRACTURES): Vit D, 25-Hydroxy: 12.12 ng/mL — ABNORMAL LOW (ref 30–100)

## 2023-09-04 MED ORDER — GADOBUTROL 1 MMOL/ML IV SOLN
10.0000 mL | Freq: Once | INTRAVENOUS | Status: AC | PRN
  Administered 2023-09-04: 10 mL via INTRAVENOUS

## 2023-09-04 MED ORDER — LORAZEPAM 2 MG/ML IJ SOLN: 0.5000 mg | Freq: Once | INTRAMUSCULAR | Status: DC | PRN

## 2023-09-04 NOTE — Hospital Course (Addendum)
Gerald Hardin. is a 89 y.o.male with a history of Complete heart block with pacemaker placement, hypertension, A-fib, PVCs, COPD and chronic cough, history of SCC who was admitted to the [facility] Teaching Service at [facility] for recurrent falls. His hospital course is detailed below:  Fall Patient presented after multiple falls to the ED and received head CT that showed diffuse vasogenic edema with possible masses.  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 Neurology consulted and recommended EEG, QuantiFERON, HIV antibody, toxoplasma, cryptococcus antigen, and procalcitonin. 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  Family requested palliative care and oncology be consulted.  RUE Pain Chronic contracture with history of squamous cell carcinoma with prior XRT/surgery.  This remained stable.   Other chronic conditions were medically managed with home medications and formulary alternatives as necessary (COPD, HTN, afib, complete heart block with pacemaker)  PCP Follow-up Recommendations: Follow up vitamin D level (low - 12.12) Incidental pulmonary nodules found on Chest CT  Med rec - multiple vitamins/OTC meds

## 2023-09-04 NOTE — Progress Notes (Signed)
Due to this pt having a pacemaker they will need nurse monitoring a rep will be here to program pt for scan at 1 pm nurse has been notified.

## 2023-09-04 NOTE — Assessment & Plan Note (Addendum)
Left sixth rib fracture. Symptomatic management.  - Incentive spirometry and pulm toiletry to prevent atelectasis

## 2023-09-04 NOTE — Assessment & Plan Note (Addendum)
CT head with extensive vasogenic edema posteriorly in both cerebral hemispheres with evidence of underlying masses. Not seen on CT from 2016.  No obvious focal deficits on exam.  Concern for primary brain cancer or metastasis to brain from other cancer, does have history of squamous cell carcinoma and is a smoker -MRI brain further evaluation

## 2023-09-04 NOTE — Evaluation (Signed)
Physical Therapy Evaluation Patient Details Name: Gerald Hardin. MRN: [id number] DOB: 1933-09-18 Today's Date: 09/04/2023  History of Present Illness  Gerald Hardin. is a 89 y.o. male presenting 1/29 after several falls.  Last fall pt hit right side of head.  CT negative for acute head injury.  CT head remarkable for "extensive vasogenic edema posteriorly in both cerebral hemispheres with evidence of underlying masses" without evidence of acute stroke. Pt with 6th rib fx as well as L5-S1 anterolisthesis and L5 pars defect.  PMH" arrythmia, peripheral neuropathy  Clinical Impression  Pt admitted with above diagnosis. Pt was only able to sit on stretcher for 8 min with CGA and cues for safety as pt is impulsive and confused.  Very weak and limited significantly in all aspects of mobility and cognition.  Pt reports he has had steady decline in function for last 4 weeks with pt not walking much with RW and having to usewheelchair and need assist with all aspects of care.  Pt will benefit from post acute rehab < 3 hours day due to decr endurance, decr balance, decr cognition and decr mobility unless wife and grandson can provide mod assist 24 hour care.  Pt currently with functional limitations due to the deficits listed below (see PT Problem List). Pt will benefit from acute skilled PT to increase their independence and safety with mobility to allow discharge.           If plan is discharge home, recommend the following: Two people to help with walking and/or transfers;Two people to help with bathing/dressing/bathroom;Assistance with cooking/housework;Assist for transportation;Help with stairs or ramp for entrance;Supervision due to cognitive status   Can travel by private vehicle   No    Equipment Recommendations Other (comment) (TBA)  Recommendations for Other Services       Functional Status Assessment Patient has had a recent decline in their functional status and demonstrates the ability  to make significant improvements in function in a reasonable and predictable amount of time.     Precautions / Restrictions Precautions Precautions: Fall Restrictions Weight Bearing Restrictions Per Provider Order: No      Mobility  Bed Mobility Overal bed mobility: Needs Assistance Bed Mobility: Supine to Sit     Supine to sit: Mod assist, HOB elevated     General bed mobility comments: Pt needed assist to move LES off bed as well as to assits with elevation of trunk.  Pt took incr time to come to edge of stretcher.    Transfers                   General transfer comment: unable to stand as pt c/o pain in bil LEs    Ambulation/Gait                  Stairs            Wheelchair Mobility     Tilt Bed    Modified Rankin (Stroke Patients Only)       Balance Overall balance assessment: Needs assistance Sitting-balance support: No upper extremity supported, Feet supported Sitting balance-Leahy Scale: Fair Sitting balance - Comments: can sit edge of stretcher without assist and without UE support.  Pt sitting angled with right hip scooted further than left hip. Took incr time for pt to get balance EOB however pt was able to get balance after a minute and sat a total of 8 minutes.  Pertinent Vitals/Pain Pain Assessment Pain Assessment: Faces Faces Pain Scale: Hurts even more Pain Location: right hip Pain Descriptors / Indicators: Grimacing, Guarding, Discomfort Pain Intervention(s): Limited activity within patient's tolerance, Monitored during session, Repositioned    Home Living Family/patient expects to be discharged to:: Private residence Living Arrangements: Spouse/significant other Available Help at Discharge: Family;Available 24 hours/day (lives with wife; grandson has come to assist) Type of Home: House Home Access: Ramped entrance       Home Layout: One level Home Equipment:  Wheelchair - Forensic psychologist (2 wheels);Tub bench;BSC/3in1 Additional Comments: legally blind and deaf, wife drives and they have a person they hire and grandson is here currently    Prior Function Prior Level of Function : Needs assist             Mobility Comments: last 3-4 weeks, wife has assisted with all mobility, used walker prior to 4 weeks ago but recenly having to use wheelchair ADLs Comments: B/D self prior to 4 weeks ago, wife has been helping recently     Extremity/Trunk Assessment   Upper Extremity Assessment Upper Extremity Assessment: RUE deficits/detail RUE Deficits / Details: limited by contractures hand and shoulder RUE: Shoulder pain with ROM;Shoulder pain at rest    Lower Extremity Assessment Lower Extremity Assessment: RLE deficits/detail;LLE deficits/detail RLE Deficits / Details: Pt yelled if PT tried to move LE RLE: Unable to fully assess due to pain RLE Sensation: history of peripheral neuropathy LLE Deficits / Details: Pt yelled if PT tried to move LE LLE: Unable to fully assess due to pain LLE Sensation: history of peripheral neuropathy    Cervical / Trunk Assessment Cervical / Trunk Assessment: Kyphotic  Communication   Communication Communication: Hearing impairment (Blind and deaf per pt) Cueing Techniques: Verbal cues;Tactile cues  Cognition Arousal: Alert Behavior During Therapy: WFL for tasks assessed/performed Overall Cognitive Status: Impaired/Different from baseline Area of Impairment: Orientation, Following commands, Safety/judgement, Awareness, Problem solving                 Orientation Level: Disoriented to, Place, Time, Situation     Following Commands: Follows one step commands inconsistently, Follows one step commands with increased time Safety/Judgement: Decreased awareness of safety, Decreased awareness of deficits   Problem Solving: Slow processing, Decreased initiation, Difficulty sequencing, Requires verbal  cues, Requires tactile cues General Comments: Pt confused stating his home address as to where he was. Difficulty for pt to focus on tasks.  Pt impulsive and restless.  Pt with very poor safety awareness.        General Comments General comments (skin integrity, edema, etc.): 70 bpm, 100% at rest, 135/113; was on 2LO2 on arrival. Asked nurse if O2 could be removed and she agreed to leave pt on RA.  179/61 post BP    Exercises     Assessment/Plan    PT Assessment Patient needs continued PT services  PT Problem List Decreased balance;Decreased mobility;Decreased knowledge of use of DME;Decreased safety awareness;Decreased strength;Decreased range of motion;Decreased activity tolerance       PT Treatment Interventions DME instruction;Gait training;Functional mobility training;Therapeutic activities;Therapeutic exercise;Balance training;Patient/family education    PT Goals (Current goals can be found in the Care Plan section)  Acute Rehab PT Goals Patient Stated Goal: unable to state PT Goal Formulation: With patient Time For Goal Achievement: 09/18/23 Potential to Achieve Goals: Fair    Frequency Min 1X/week     Co-evaluation               AM-PAC  PT "6 Clicks" Mobility  Outcome Measure Help needed turning from your back to your side while in a flat bed without using bedrails?: A Lot Help needed moving from lying on your back to sitting on the side of a flat bed without using bedrails?: A Lot Help needed moving to and from a bed to a chair (including a wheelchair)?: Total Help needed standing up from a chair using your arms (e.g., wheelchair or bedside chair)?: Total Help needed to walk in hospital room?: Total Help needed climbing 3-5 steps with a railing? : Total 6 Click Score: 8    End of Session Equipment Utilized During Treatment: Gait belt Activity Tolerance: Patient limited by fatigue;Patient limited by pain (liomited by confusion) Patient left: with call  bell/phone within reach (on stretcher) Nurse Communication: Mobility status (pt confusion) PT Visit Diagnosis: Unsteadiness on feet (R26.81);Muscle weakness (generalized) (M62.81);Pain Pain - Right/Left:  (bil) Pain - part of body: Hip;Knee;Leg;Ankle and joints of foot;Shoulder;Hand    Time: 4098-1191 PT Time Calculation (min) (ACUTE ONLY): 40 min   Charges:   PT Evaluation $PT Eval Moderate Complexity: 1 Mod PT Treatments $Therapeutic Activity: 8-22 mins PT General Charges $$ ACUTE PT VISIT: 1 Visit         Daimion Adamcik M,PT Acute Rehab Services [phone]   Bevelyn Buckles 09/04/2023, 10:46 AM

## 2023-09-04 NOTE — Assessment & Plan Note (Addendum)
Chronic limitation to right hand and upper extremity with history of squamous cell CA and prior XRT/surgery. Acute on chronic right shoulder pain. CT shows degenerative changes of b/l shoulders, may require dedicated imaging. No acute deformities or abnormalities. Not complaining of shoulder pain this morning - will continue to monitor

## 2023-09-04 NOTE — Assessment & Plan Note (Addendum)
Multiple pulmonary nodules found incidentally on CT chest, right solid pulmonary nodule 12 mm. - Will need outpatient follow-up

## 2023-09-04 NOTE — Assessment & Plan Note (Addendum)
Dermabond from EDP.

## 2023-09-04 NOTE — Progress Notes (Addendum)
Daily Progress Note Intern Pager: [phone]  Patient name: Gerald Hardin. Medical record number: [id number] Date of birth: [birth year] Age: 89 y.o. Gender: male  Primary Care Provider: Patient, No Pcp Per Consultants: None Code Status: Full  Pt Overview and Major Events to Date:  1/29 - admitted  Assessment and Plan:  89yo hx SCC of RUE, afib on Eliquis, pacemaker placement (d/t complete heart block) presenting with frequent falls suspected to be in setting of possible intracranial mass found on CT, currently awaiting MRI for more information.  Assessment & Plan Fall Suspect multiple falls to be in the setting of new findings on CT head including extensive vasogenic edema.  Concerning for metastasis to the brain or primary malignancy.  Will also consider cardiac cause and await echo - CK, CBC, BMP, Mag pending  - PT/OT - MR brain pending - Echo - Cardiac Tele - Interrogate pacer  - Fall precautions  - restart eliquis pending amount of edema/hemorrhage? On MRI  - regular diet  Abnormal brain CT CT head with extensive vasogenic edema posteriorly in both cerebral hemispheres with evidence of underlying masses. Not seen on CT from 2016.  No obvious focal deficits on exam.  Concern for primary brain cancer or metastasis to brain from other cancer, does have history of squamous cell carcinoma and is a smoker -MRI brain further evaluation Superficial abrasion Multiple superficial abrasions. Monitor and keep clean. On AC.   Right hip pain CT pelvis: No acute intrathoracic, intra-abdominal, intrapelvic traumatic injury. Painful on examination without overt deformities or abnormalities. Limited ROM. - PT/OT  Right shoulder pain Chronic limitation to right hand and upper extremity with history of squamous cell CA and prior XRT/surgery. Acute on chronic right shoulder pain. CT shows degenerative changes of b/l shoulders, may require dedicated imaging. No acute deformities or  abnormalities. Not complaining of shoulder pain this morning - will continue to monitor Medication management Multiple vitamins & OTC medications used at home: B-Complex, Glucosamine & Chondroitin complex, CoQ10, Lutein, Niacin, Zeaxanthin, Vit A PO, Vit D3. Holding most for now, patient very particular about his medications. Vitamin D level is low - Will need med rec outpatient Rib fracture Left sixth rib fracture. Symptomatic management.  - Incentive spirometry and pulm toiletry to prevent atelectasis Laceration of head Dermabond from EDP.  Abnormal CT scan, lumbar spine "Bilateral L4 and L5 pars interarticularis defects with grade 1 anterolisthesis of L5 on S1." Questionable of this contribution to falls. Not a good surgical candidate.  No pain at this time -Will continue to monitor for back pain or new weakness Pulmonary nodules Multiple pulmonary nodules found incidentally on CT chest, right solid pulmonary nodule 12 mm. - Will need outpatient follow-up    Chronic and Stable Problems:  Complete Heart Block (has Pacemaker - [employer]): Echo 06/2010 normal LV function without wall motion abnormalities, will need repeat. Hx persistent AF on eliquis HTN: trandolapril-verapamil 4-240mg  and Imdur 30mg . Restarted home imdur, holding combination pill at this time (can be given individually if needed to add back) Afib: On eliquis COPD: duonebs PRN, IS, pulmonary toiletry   FEN/GI: regular diet PPx: see above  Dispo:Home pending clinical improvement   Subjective:  Patient concerned about long ED stay, lack of coffee. No pain or acute complaints. Desires to watch news.  After long discussion decided to stay for MRI for further imaging. No medical complaints at this time.   Objective: Temp:  [97.6 F (36.4 C)-98.8 F (37.1 C)] 98  F (36.7 C) (01/30 0506) Pulse Rate:  [67-111] 70 (01/30 0600) Resp:  [12-25] 21 (01/30 0600) BP: (103-159)/(56-101) 124/69 (01/30 0600) SpO2:  [88 %-100  %] 100 % (01/30 0600) Weight:  [102.1 kg] 102.1 kg (01/29 1314) Physical Exam: General: No acute distress, chronically ill-appearing.  Bruising over left eye Cardiovascular: RRR, no m/r/g Respiratory: normal work of breathing on RA, coarse breath sounds mild Abdomen: Normal bowel sounds, soft, non-tender Extremities: No swelling BLE.  Multiple superficial scattered abrasions to bilateral upper extremities  Laboratory: Most recent CBC Lab Results  Component Value Date   WBC 12.1 (H) 09/04/2023   HGB 13.0 09/04/2023   HCT 39.5 09/04/2023   MCV 93.8 09/04/2023   PLT 197 09/04/2023   Most recent BMP    Latest Ref Rng & Units 09/04/2023    4:02 AM  BMP  Glucose 70 - 99 mg/dL 324   BUN 8 - 23 mg/dL 12   Creatinine 4.01 - 1.24 mg/dL 0.27   Sodium 253 - 664 mmol/L 137   Potassium 3.5 - 5.1 mmol/L 4.0   Chloride 98 - 111 mmol/L 104   CO2 22 - 32 mmol/L 20   Calcium 8.9 - 10.3 mg/dL 8.7    CK 30 Magnesium 1.9 Vit D low 12.12 Vitamin B12 776 Lactic acid 1.6  Imaging/Diagnostic Tests: None New Pending MRI brain  Para March, DO 09/04/2023, 7:02 AM  PGY-1, [facility] FPTS Intern pager: [phone], text pages welcome Secure chat group [facility] Teaching Service

## 2023-09-04 NOTE — ED Notes (Signed)
Patient going from MRI to floor

## 2023-09-04 NOTE — Evaluation (Signed)
Occupational Therapy Evaluation Patient Details Name: Gerald Hardin. MRN: [id number] DOB: [birth year] Today's Date: 09/04/2023   History of Present Illness Gerald Hardin. is a 89 y.o. male presenting 1/29 after several falls.  Last fall pt hit right side of head.  CT negative for acute head injury.  CT head remarkable for "extensive vasogenic edema posteriorly in both cerebral hemispheres with evidence of underlying masses" without evidence of acute stroke. Pt with 6th rib fx as well as L5-S1 anterolisthesis and L5 pars defect.  PMH" arrythmia, peripheral neuropathy   Clinical Impression   Patient admitted for the diagnosis above.  PTA he lives at home with his spouse, grandson, and what sounds like hired PCA.  Patient admits to needing increasing assist at home.  Currently he is limited by pain, poor activity tolerance, poor balance, weakness and poor safety/insight.  Currently patient is close to +2 for safe mobility and bedlevel for ADL completion.  OT can follow in the acute setting to address deficits, and Patient will benefit from continued inpatient follow up therapy, <3 hours/day.      If plan is discharge home, recommend the following: A lot of help with bathing/dressing/bathroom;Assistance with cooking/housework;Assist for transportation;Direct supervision/assist for financial management;Direct supervision/assist for medications management;Help with stairs or ramp for entrance;Two people to help with walking and/or transfers    Functional Status Assessment  Patient has had a recent decline in their functional status and demonstrates the ability to make significant improvements in function in a reasonable and predictable amount of time.  Equipment Recommendations  BSC/3in1    Recommendations for Other Services       Precautions / Restrictions Precautions Precautions: Fall Restrictions Weight Bearing Restrictions Per Provider Order: No      Mobility Bed Mobility Overal  bed mobility: Needs Assistance Bed Mobility: Supine to Sit     Supine to sit: Max assist, +2 for safety/equipment       Patient Response: Cooperative  Transfers                          Balance Overall balance assessment: Needs assistance Sitting-balance support: No upper extremity supported, Feet supported Sitting balance-Leahy Scale: Poor   Postural control: Posterior lean, Right lateral lean                                 ADL either performed or assessed with clinical judgement   ADL Overall ADL's : Needs assistance/impaired Eating/Feeding: Minimal assistance;Bed level   Grooming: Wash/dry hands;Wash/dry face;Minimal assistance;Bed level   Upper Body Bathing: Moderate assistance;Bed level   Lower Body Bathing: Maximal assistance;Bed level   Upper Body Dressing : Moderate assistance;Bed level   Lower Body Dressing: Maximal assistance;Bed level       Toileting- Clothing Manipulation and Hygiene: Total assistance;Bed level               Vision Baseline Vision/History: 2 Legally blind Patient Visual Report: No change from baseline       Perception Perception: Not tested       Praxis Praxis: Not tested       Pertinent Vitals/Pain Pain Assessment Faces Pain Scale: Hurts even more Pain Location: right hip Pain Descriptors / Indicators: Grimacing, Guarding, Discomfort Pain Intervention(s): Monitored during session     Extremity/Trunk Assessment Upper Extremity Assessment Upper Extremity Assessment: Right hand dominant;RUE deficits/detail;LUE deficits/detail RUE Deficits / Details: limited by  contractures hand and shoulder RUE Sensation: WNL LUE Deficits / Details: decreased end range shoulder flexion LUE Sensation: WNL LUE Coordination: WNL   Lower Extremity Assessment Lower Extremity Assessment: Defer to PT evaluation   Cervical / Trunk Assessment Cervical / Trunk Assessment: Kyphotic   Communication  Communication Communication: Hearing impairment   Cognition Arousal: Alert Behavior During Therapy: WFL for tasks assessed/performed Overall Cognitive Status: Impaired/Different from baseline Area of Impairment: Orientation, Following commands, Safety/judgement, Awareness, Problem solving                 Orientation Level: Disoriented to, Place, Time, Situation     Following Commands: Follows one step commands inconsistently, Follows one step commands with increased time Safety/Judgement: Decreased awareness of safety, Decreased awareness of deficits Awareness: Emergent Problem Solving: Slow processing, Decreased initiation, Difficulty sequencing, Requires verbal cues, Requires tactile cues General Comments: Pt impulsive and restless.  Pt with very poor safety awareness.     General Comments   VSS on RA    Exercises     Shoulder Instructions      Home Living Family/patient expects to be discharged to:: Private residence Living Arrangements: Spouse/significant other Available Help at Discharge: Family;Available 24 hours/day Type of Home: House Home Access: Ramped entrance     Home Layout: One level     Bathroom Shower/Tub: Chief Strategy Officer: Standard     Home Equipment: Wheelchair - Forensic psychologist (2 wheels);Tub bench;BSC/3in1   Additional Comments: legally blind and deaf, wife drives and they have a person they hire and grandson is here currently      Prior Functioning/Environment Prior Level of Function : Needs assist             Mobility Comments: last 3-4 weeks, wife has assisted with all mobility, used walker prior to 4 weeks ago but recenly having to use wheelchair ADLs Comments: B/D self prior to 4 weeks ago, wife has been helping recently        OT Problem List: Decreased strength;Decreased range of motion;Decreased activity tolerance;Impaired balance (sitting and/or standing);Pain;Decreased safety awareness;Decreased  cognition      OT Treatment/Interventions: Self-care/ADL training;Therapeutic activities;Balance training;DME and/or AE instruction;Patient/family education    OT Goals(Current goals can be found in the care plan section) Acute Rehab OT Goals Patient Stated Goal: none stated OT Goal Formulation: Patient unable to participate in goal setting Time For Goal Achievement: 09/18/23 Potential to Achieve Goals: Fair ADL Goals Pt Will Perform Grooming: with set-up;sitting Pt Will Perform Upper Body Bathing: with set-up;sitting Pt Will Perform Upper Body Dressing: with min assist;sitting Pt Will Transfer to Toilet: with min assist;with +2 assist;bedside commode;stand pivot transfer  OT Frequency: Min 1X/week    Co-evaluation              AM-PAC OT "6 Clicks" Daily Activity     Outcome Measure Help from another person eating meals?: A Little Help from another person taking care of personal grooming?: A Little Help from another person toileting, which includes using toliet, bedpan, or urinal?: A Lot Help from another person bathing (including washing, rinsing, drying)?: A Lot Help from another person to put on and taking off regular upper body clothing?: A Lot Help from another person to put on and taking off regular lower body clothing?: A Lot 6 Click Score: 14   End of Session Nurse Communication: Mobility status  Activity Tolerance: Patient limited by pain Patient left: in bed;with call bell/phone within reach;with bed alarm set  OT Visit  Diagnosis: Muscle weakness (generalized) (M62.81);History of falling (Z91.81);Other symptoms and signs involving cognitive function;Pain Pain - Right/Left: Right Pain - part of body: Leg;Hip                Time: 1610-9604 OT Time Calculation (min): 17 min Charges:  OT General Charges $OT Visit: 1 Visit OT Evaluation $OT Eval Moderate Complexity: 1 Mod  09/04/2023  RP, OTR/L  Acute Rehabilitation Services  Office:   [phone]   Gerald Hardin 09/04/2023, 4:10 PM

## 2023-09-04 NOTE — Assessment & Plan Note (Addendum)
Suspect multiple falls to be in the setting of new findings on CT head including extensive vasogenic edema.  Concerning for metastasis to the brain or primary malignancy.  Will also consider cardiac cause and await echo - CK, CBC, BMP, Mag pending  - PT/OT - MR brain pending - Echo - Cardiac Tele - Interrogate pacer  - Fall precautions  - restart eliquis pending amount of edema/hemorrhage? On MRI  - regular diet

## 2023-09-04 NOTE — Assessment & Plan Note (Addendum)
Multiple vitamins & OTC medications used at home: B-Complex, Glucosamine & Chondroitin complex, CoQ10, Lutein, Niacin, Zeaxanthin, Vit A PO, Vit D3. Holding most for now, patient very particular about his medications. Vitamin D level is low - Will need med rec outpatient

## 2023-09-04 NOTE — Assessment & Plan Note (Addendum)
"  Bilateral L4 and L5 pars interarticularis defects with grade 1 anterolisthesis of L5 on S1." Questionable of this contribution to falls. Not a good surgical candidate.  No pain at this time -Will continue to monitor for back pain or new weakness

## 2023-09-04 NOTE — Assessment & Plan Note (Addendum)
Multiple superficial abrasions. Monitor and keep clean. On AC.

## 2023-09-04 NOTE — Assessment & Plan Note (Addendum)
CT pelvis: No acute intrathoracic, intra-abdominal, intrapelvic traumatic injury. Painful on examination without overt deformities or abnormalities. Limited ROM. - PT/OT

## 2023-09-04 NOTE — Plan of Care (Signed)
Consulted [facility] with regard to MRI findings. They will see patient. Greatly appreciate their assistance with this patient's care.

## 2023-09-04 NOTE — TOC CAGE-AID Note (Signed)
Transition of Care [facility]) - CAGE-AID Screening   Patient Details  Name: Gerald Hardin. MRN: [id number] Date of Birth: 1933-10-03  Transition of Care [facility]) CM/SW Contact:    Leota Sauers, RN Phone Number: 09/04/2023, 5:59 AM   Clinical Narrative:  Patient denies use of alcohol and illicit substances. Resources not given at this time.   CAGE-AID Screening:    Have You Ever Felt You Ought to Cut Down on Your Drinking or Drug Use?: No Have People Annoyed You By Critizing Your Drinking Or Drug Use?: No Have You Felt Bad Or Guilty About Your Drinking Or Drug Use?: No Have You Ever Had a Drink or Used Drugs First Thing In The Morning to Steady Your Nerves or to Get Rid of a Hangover?: No CAGE-AID Score: 0  Substance Abuse Education Offered: No

## 2023-09-05 ENCOUNTER — Inpatient Hospital Stay (HOSPITAL_COMMUNITY): Payer: Medicare Other

## 2023-09-05 DIAGNOSIS — R569 Unspecified convulsions: Secondary | ICD-10-CM | POA: Diagnosis not present

## 2023-09-05 DIAGNOSIS — C7931 Secondary malignant neoplasm of brain: Secondary | ICD-10-CM

## 2023-09-05 DIAGNOSIS — G9389 Other specified disorders of brain: Secondary | ICD-10-CM | POA: Diagnosis not present

## 2023-09-05 DIAGNOSIS — I4891 Unspecified atrial fibrillation: Secondary | ICD-10-CM | POA: Diagnosis not present

## 2023-09-05 DIAGNOSIS — R918 Other nonspecific abnormal finding of lung field: Secondary | ICD-10-CM | POA: Diagnosis not present

## 2023-09-05 DIAGNOSIS — W19XXXA Unspecified fall, initial encounter: Secondary | ICD-10-CM | POA: Diagnosis not present

## 2023-09-05 LAB — CRYPTOCOCCAL ANTIGEN: Crypto Ag: NEGATIVE

## 2023-09-05 LAB — ECHOCARDIOGRAM COMPLETE
Area-P 1/2: 2.2 cm2
Height: 71 in
S' Lateral: 3.7 cm
Single Plane A4C EF: 58.4 %
Weight: 3600 [oz_av]

## 2023-09-05 LAB — BASIC METABOLIC PANEL
Anion gap: 9 (ref 5–15)
BUN: 28 mg/dL — ABNORMAL HIGH (ref 8–23)
CO2: 22 mmol/L (ref 22–32)
Calcium: 8.7 mg/dL — ABNORMAL LOW (ref 8.9–10.3)
Chloride: 107 mmol/L (ref 98–111)
Creatinine, Ser: 1.19 mg/dL (ref 0.61–1.24)
GFR, Estimated: 59 mL/min — ABNORMAL LOW (ref >60)
Glucose, Bld: 101 mg/dL — ABNORMAL HIGH (ref 70–99)
Potassium: 4.3 mmol/L (ref 3.5–5.1)
Sodium: 138 mmol/L (ref 135–145)

## 2023-09-05 LAB — PROCALCITONIN: Procalcitonin: 0.2 ng/mL

## 2023-09-05 LAB — CBC
HCT: 35.2 % — ABNORMAL LOW (ref 39.0–52.0)
Hemoglobin: 12 g/dL — ABNORMAL LOW (ref 13.0–17.0)
MCH: 31.3 pg (ref 26.0–34.0)
MCHC: 34.1 g/dL (ref 30.0–36.0)
MCV: 91.7 fL (ref 80.0–100.0)
Platelets: 202 10*3/uL (ref 150–400)
RBC: 3.84 MIL/uL — ABNORMAL LOW (ref 4.22–5.81)
RDW: 13.4 % (ref 11.5–15.5)
WBC: 13.2 10*3/uL — ABNORMAL HIGH (ref 4.0–10.5)
nRBC: 0 % (ref 0.0–0.2)

## 2023-09-05 LAB — HIV ANTIBODY (ROUTINE TESTING W REFLEX): HIV Screen 4th Generation wRfx: NONREACTIVE

## 2023-09-05 LAB — HEMOGLOBIN A1C
Hgb A1c MFr Bld: 5 % (ref 4.8–5.6)
Mean Plasma Glucose: 96.8 mg/dL

## 2023-09-05 MED ORDER — SODIUM CHLORIDE 0.9 % IV SOLN
2000.0000 mg | Freq: Once | INTRAVENOUS | Status: AC
  Administered 2023-09-05: 2000 mg via INTRAVENOUS
  Filled 2023-09-05: qty 20

## 2023-09-05 MED ORDER — PERFLUTREN LIPID MICROSPHERE
1.0000 mL | INTRAVENOUS | Status: AC | PRN
  Administered 2023-09-05: 1 mL via INTRAVENOUS

## 2023-09-05 MED ORDER — NICOTINE 21 MG/24HR TD PT24
21.0000 mg | MEDICATED_PATCH | Freq: Every day | TRANSDERMAL | Status: DC
  Administered 2023-09-05 – 2023-09-10 (×6): 21 mg via TRANSDERMAL
  Filled 2023-09-05 (×6): qty 1

## 2023-09-05 MED ORDER — APIXABAN 5 MG PO TABS
5.0000 mg | ORAL_TABLET | Freq: Two times a day (BID) | ORAL | Status: DC
  Administered 2023-09-05 – 2023-09-06 (×2): 5 mg via ORAL
  Filled 2023-09-05 (×3): qty 1

## 2023-09-05 MED ORDER — LEVETIRACETAM IN NACL 500 MG/100ML IV SOLN
500.0000 mg | Freq: Two times a day (BID) | INTRAVENOUS | Status: DC
  Administered 2023-09-06: 500 mg via INTRAVENOUS
  Filled 2023-09-05 (×2): qty 100

## 2023-09-05 NOTE — Procedures (Signed)
Patient Name: Gerald Hardin.  MRN: [id number]  Epilepsy Attending: Charlsie Quest  Referring Physician/Provider: Erick Blinks, MD  Date: 09/05/2023 Duration: 27.34 mins  Patient history: 89yo F with fall, CTH with edema concerning for tumor. EEG to evaluate for seizure  Level of alertness: Awake, asleep  AEDs during EEG study: None  Technical aspects: This EEG study was done with scalp electrodes positioned according to the 10-20 International system of electrode placement. Electrical activity was reviewed with band pass filter of 1-70Hz , sensitivity of 7 uV/mm, display speed of 62mm/sec with a 60Hz  notched filter applied as appropriate. EEG data were recorded continuously and digitally stored.  Video monitoring was available and reviewed as appropriate.  Description: The posterior dominant rhythm consists of 8 Hz activity of moderate voltage (25-35 uV) seen predominantly in posterior head regions, symmetric and reactive to eye opening and eye closing. Sleep was characterized by vertex waves, sleep spindles (12 to 14 Hz), maximal frontocentral region. EEG showed continuous 5 to 6 Hz theta slowing in bilateral posterior quadrant. Sharp waves were noted in left occipital region. Hyperventilation and photic stimulation were not performed.     ABNORMALITY - Sharp wave, left occipital region - Continuous slow, bilateral posterior quadrant  IMPRESSION: This study showed evidence of epileptogenicity arising from left occipital region. Additionally there is cortical dysfunction arising from bilateral posterior quadrant likely secondary to underlying structural abnormality. No seizures were seen throughout the recording.  [name]

## 2023-09-05 NOTE — Progress Notes (Signed)
Mb arrived to room after calling and getting Ok from Nurse, Pt is currently with Echo and will be approx 20 minutes per Tech.

## 2023-09-05 NOTE — Assessment & Plan Note (Addendum)
 Left sixth rib fracture. Symptomatic management.  - Incentive spirometry and pulm toiletry to prevent atelectasis

## 2023-09-05 NOTE — Assessment & Plan Note (Addendum)
 Multiple vitamins & OTC medications used at home: B-Complex, Glucosamine & Chondroitin complex, CoQ10, Lutein, Niacin, Zeaxanthin, Vit A PO, Vit D3. Holding most for now, patient very particular about his medications. Vitamin D level is low - Will need med rec outpatient

## 2023-09-05 NOTE — TOC Initial Note (Signed)
Transition of Care (TOC) - Initial/Assessment Note    Patient Details  Name: Gerald Hardin. MRN: [id number] Date of Birth: [birth year]  Transition of Care [facility]) CM/SW Contact:    Jarvis Sawa A [place], LCSWA Phone Number: 09/05/2023, 4:35 PM  Clinical Narrative:                 CSW met with pt at bedside, pt was somewhat disoriented.CSW reached out to pt's wife, Kathie Rhodes, she said that she was agreeable for pt to go to rehab. Still open for home if pt is well enough to be managed at home. CSW completed SNF workup, bed offers pending. No preference noted.    TOC will continue to follow.   Expected Discharge Plan: Skilled Nursing Facility Barriers to Discharge: Continued Medical Work up, SNF Pending bed offer   Patient Goals and CMS Choice            Expected Discharge Plan and Services In-house Referral: Clinical Social Work     Living arrangements for the past 2 months: Skilled Nursing Facility                                      Prior Living Arrangements/Services Living arrangements for the past 2 months: Skilled Nursing Facility Lives with:: Adult Children          Need for Family Participation in Patient Care: Yes (Comment) Care giver support system in place?: Yes (comment) (pt's wife, betty)      Activities of Daily Living   ADL Screening (condition at time of admission) Independently performs ADLs?: Yes (appropriate for developmental age) Is the patient deaf or have difficulty hearing?: Yes Does the patient have difficulty seeing, even when wearing glasses/contacts?: Yes Does the patient have difficulty concentrating, remembering, or making decisions?: Yes (since 1/20)  Permission Sought/Granted                  Emotional Assessment Appearance:: Disheveled Attitude/Demeanor/Rapport: Combative Affect (typically observed): Irritable Orientation: : Oriented to Self, Oriented to Place Alcohol / Substance Use: Tobacco Use Psych Involvement: No  (comment)  Admission diagnosis:  Fall [W19.XXXA] Fall, initial encounter [id number]] Patient Active Problem List   Diagnosis Date Noted   Brain mass 09/05/2023   Fall 09/03/2023   Superficial abrasion 09/03/2023   Right hip pain 09/03/2023   Right shoulder pain 09/03/2023   Laceration of head 09/03/2023   Abnormal brain CT 09/03/2023   Medication management 09/03/2023   Rib fracture 09/03/2023   Abnormal CT scan, lumbar spine 09/03/2023   Pulmonary nodules 09/03/2023   Persistent atrial fibrillation (HCC) 09/06/2021   PVC (premature ventricular contraction) 09/06/2021   PAF (paroxysmal atrial fibrillation) (HCC) 05/21/2021   Preoperative clearance 04/02/2012   Hypotension 12/11/2010   Complete heart block (HCC) 12/11/2010   Long term (current) use of anticoagulants 10/30/2010   DVT (deep venous thrombosis) (HCC) 10/04/2010   TOBACCO ABUSE 08/13/2010   PACEMAKER, PERMANENT 08/13/2010   DM 08/10/2010   Hyperlipidemia 08/10/2010   Essential hypertension 08/10/2010   Other specified chronic obstructive pulmonary disease (HCC) 08/10/2010   Disorder resulting from impaired renal function 08/10/2010   SYNCOPE, HX OF 08/10/2010   EDEMA 07/06/2010   PCP:  Patient, No Pcp Per Pharmacy:   [id number] [place], [place] - [address] [place] [address] Phone: [phone] Fax: [phone]     Social Drivers of Health (SDOH)  Social History: SDOH Screenings   Food Insecurity: No Food Insecurity (09/04/2023)  Housing: Unknown (09/04/2023)  Transportation Needs: No Transportation Needs (09/04/2023)  Utilities: Not At Risk (09/04/2023)  Social Connections: Unknown (09/04/2023)  Tobacco Use: High Risk (09/03/2023)   SDOH Interventions:     Readmission Risk Interventions     No data to display

## 2023-09-05 NOTE — Assessment & Plan Note (Addendum)
 Multiple pulmonary nodules found incidentally on CT chest, right solid pulmonary nodule 12 mm. - Will need outpatient follow-up

## 2023-09-05 NOTE — Assessment & Plan Note (Addendum)
Suspect multiple falls to be in the setting of new findings on CT head including extensive vasogenic edema.  Concerning for metastasis to the brain or primary malignancy.  Echo overall unremarkable.  -Neuro recommendations (suspect neoplasm) - EEG, quantiferon, HIV Ab, Toxoplasma IgM and IgG, Cryptococcus Ag, Procalcitonin - Oncology consulted - Palliative care consulted - Cardiac Tele - Interrogate pacer  - Fall precautions  - restart eliquis  - regular diet

## 2023-09-05 NOTE — Assessment & Plan Note (Addendum)
 Multiple superficial abrasions. Monitor and keep clean. On AC.

## 2023-09-05 NOTE — Plan of Care (Signed)
  Problem: Clinical Measurements: Goal: Ability to maintain clinical measurements within normal limits will improve Outcome: Progressing Goal: Cardiovascular complication will be avoided Outcome: Progressing   Problem: Elimination: Goal: Will not experience complications related to urinary retention Outcome: Progressing   Problem: Pain Managment: Goal: General experience of comfort will improve and/or be controlled Outcome: Progressing   Problem: Safety: Goal: Ability to remain free from injury will improve Outcome: Progressing

## 2023-09-05 NOTE — Assessment & Plan Note (Addendum)
 Chronic limitation to right hand and upper extremity with history of squamous cell CA and prior XRT/surgery. Acute on chronic right shoulder pain. CT shows degenerative changes of b/l shoulders, may require dedicated imaging. No acute deformities or abnormalities. Not complaining of shoulder pain this morning - will continue to monitor

## 2023-09-05 NOTE — Assessment & Plan Note (Addendum)
 Dermabond from EDP.

## 2023-09-05 NOTE — Progress Notes (Signed)
 EEG complete - results pending

## 2023-09-05 NOTE — NC FL2 (Signed)
[place] MEDICAID FL2 LEVEL OF CARE FORM     IDENTIFICATION  Patient Name: Gerald Hardin. Birthdate: Aug 29, 1933 Sex: male Admission Date (Current Location): 09/03/2023  [facility] and [place] Number:  Producer, television/film/video and Address:  The [facility], [address] [place], [place] [address]      Provider Number: [id number]  Attending Physician Name and Address:  Westley Chandler, MD  Relative Name and Phone Number:  Terel, Bann (Spouse)  [phone]    Current Level of Care: Hospital Recommended Level of Care: Skilled Nursing Facility Prior Approval Number:    Date Approved/Denied:   PASRR Number: screen still running  Discharge Plan: SNF    Current Diagnoses: Patient Active Problem List   Diagnosis Date Noted   Brain mass 09/05/2023   Fall 09/03/2023   Superficial abrasion 09/03/2023   Right hip pain 09/03/2023   Right shoulder pain 09/03/2023   Laceration of head 09/03/2023   Abnormal brain CT 09/03/2023   Medication management 09/03/2023   Rib fracture 09/03/2023   Abnormal CT scan, lumbar spine 09/03/2023   Pulmonary nodules 09/03/2023   Persistent atrial fibrillation (HCC) 09/06/2021   PVC (premature ventricular contraction) 09/06/2021   PAF (paroxysmal atrial fibrillation) (HCC) 05/21/2021   Preoperative clearance 04/02/2012   Hypotension 12/11/2010   Complete heart block (HCC) 12/11/2010   Long term (current) use of anticoagulants 10/30/2010   DVT (deep venous thrombosis) (HCC) 10/04/2010   TOBACCO ABUSE 08/13/2010   PACEMAKER, PERMANENT 08/13/2010   DM 08/10/2010   Hyperlipidemia 08/10/2010   Essential hypertension 08/10/2010   Other specified chronic obstructive pulmonary disease (HCC) 08/10/2010   Disorder resulting from impaired renal function 08/10/2010   SYNCOPE, HX OF 08/10/2010   EDEMA 07/06/2010    Orientation RESPIRATION BLADDER Height & Weight     Self, Time, Situation, Place  Normal Incontinent Weight: 225  lb (102.1 kg) Height:  5\' 11"  (180.3 cm)  BEHAVIORAL SYMPTOMS/MOOD NEUROLOGICAL BOWEL NUTRITION STATUS      Incontinent Diet (see DC summary)  AMBULATORY STATUS COMMUNICATION OF NEEDS Skin   Extensive Assist Verbally Other (Comment) (abraisions on left and right fingers due to fall)                       Personal Care Assistance Level of Assistance  Bathing, Feeding, Dressing Bathing Assistance: Maximum assistance Feeding assistance: Limited assistance Dressing Assistance: Maximum assistance     Functional Limitations Info  Hearing, Speech, Sight Sight Info: Impaired Hearing Info: Impaired Speech Info: Adequate    SPECIAL CARE FACTORS FREQUENCY  OT (By licensed OT), PT (By licensed PT)     PT Frequency: 5x/week OT Frequency: 5x/week            Contractures Contractures Info: Not present    Additional Factors Info  Code Status, Allergies Code Status Info: FULL Allergies Info: Other  Chicken Meat (Diagnostic)  Egg-derived Products           Current Medications (09/05/2023):  This is the current hospital active medication list Current Facility-Administered Medications  Medication Dose Route Frequency Provider Last Rate Last Admin   acetaminophen (TYLENOL) tablet 650 mg  650 mg Oral Q6H PRN Alfredo Martinez, MD   650 mg at 09/03/23 2215   apixaban (ELIQUIS) tablet 5 mg  5 mg Oral BID Levin Erp, MD       fenofibrate tablet 54 mg  54 mg Oral Daily Alfredo Martinez, MD  ipratropium-albuterol (DUONEB) 0.5-2.5 (3) MG/3ML nebulizer solution 3 mL  3 mL Nebulization Q6H PRN Alfredo Martinez, MD       isosorbide mononitrate (IMDUR) 24 hr tablet 30 mg  30 mg Oral Daily Jena Gauss, Allee, MD   30 mg at 09/03/23 2057   levETIRAcetam (KEPPRA) 2,000 mg in sodium chloride 0.9 % 250 mL IVPB  2,000 mg Intravenous Once Jefferson Fuel, MD       Followed by   Melene Muller ON 09/06/2023] levETIRAcetam (KEPPRA) IVPB 500 mg/100 mL premix  500 mg Intravenous Q12H Jefferson Fuel, MD        LORazepam (ATIVAN) injection 0.5 mg  0.5 mg Intravenous Once PRN Everhart, Kirstie, DO       nicotine (NICODERM CQ - dosed in mg/24 hours) patch 21 mg  21 mg Transdermal Daily Everhart, Kirstie, DO       simvastatin (ZOCOR) tablet 10 mg  10 mg Oral Daily Alfredo Martinez, MD   10 mg at 09/05/23 1133   Facility-Administered Medications Ordered in Other Encounters  Medication Dose Route Frequency Provider Last Rate Last Admin   regadenoson (LEXISCAN) injection SOLN 0.4 mg  0.4 mg Intravenous Once Chilton Si, MD       technetium tetrofosmin (TC-MYOVIEW) injection 31.9 millicurie  31.9 millicurie Intravenous Once PRN Chilton Si, MD         Discharge Medications: Please see discharge summary for a list of discharge medications.  Relevant Imaging Results:  Relevant Lab Results:   Additional Information FAO:[id number]  Malaika Arnall A [place], [place]

## 2023-09-05 NOTE — Progress Notes (Signed)
Daily Progress Note Intern Pager: [phone]  Patient name: Gerald Hardin. Medical record number: [id number] Date of birth: 1934-07-17 Age: 89 y.o. Gender: male  Primary Care Provider: Patient, No Pcp Per Consultants: Neurology Code Status: Full  Pt Overview and Major Events to Date:  1/29 - admitted   Assessment and Plan:  89yo hx SCC of RUE, afib on Eliquis, pacemaker placement (d/t complete heart block) presenting with frequent falls. Found to have peripherally enhancing masses in bilateral posterior temporal in occipital lobes with significant surrounding edema, favored to represent necrotic neoplasms.  Discussed palliative care and hospice with family today. They are requesting both palliative consult and oncology to see today.  Assessment & Plan Fall Suspect multiple falls to be in the setting of new findings on CT head including extensive vasogenic edema.  Concerning for metastasis to the brain or primary malignancy.  Echo overall unremarkable.  -Neuro recommendations (suspect neoplasm) - EEG, quantiferon, HIV Ab, Toxoplasma IgM and IgG, Cryptococcus Ag, Procalcitonin - Oncology consulted - Palliative care consulted - Cardiac Tele - Interrogate pacer  - Fall precautions  - restart eliquis  - regular diet  Superficial abrasion Multiple superficial abrasions. Monitor and keep clean. On AC.   Right hip pain CT pelvis: No acute intrathoracic, intra-abdominal, intrapelvic traumatic injury. Painful on examination without overt deformities or abnormalities. Limited ROM. - PT/OT  Right shoulder pain Chronic limitation to right hand and upper extremity with history of squamous cell CA and prior XRT/surgery. Acute on chronic right shoulder pain. CT shows degenerative changes of b/l shoulders, may require dedicated imaging. No acute deformities or abnormalities. Not complaining of shoulder pain this morning - will continue to monitor Rib fracture Left sixth rib fracture.  Symptomatic management.  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 Not a good surgical candidate.  No pain at this time -Will continue to monitor for back pain or new weakness Pulmonary nodules Multiple pulmonary nodules found incidentally on CT chest, right solid pulmonary nodule 12 mm. - Will need outpatient follow-up Laceration of head Dermabond from EDP.  Medication management Multiple vitamins & OTC medications used at home: B-Complex, Glucosamine & Chondroitin complex, CoQ10, Lutein, Niacin, Zeaxanthin, Vit A PO, Vit D3. Holding most for now, patient very particular about his medications. Vitamin D level is low - Will need med rec outpatient   Chronic and Stable Problems:  Complete Heart Block (has Pacemaker - [employer]): Echo 06/2010 normal LV function without wall motion abnormalities, will need repeat. Hx persistent AF on eliquis HTN: trandolapril-verapamil 4-240mg  and Imdur 30mg . Restarted home imdur, holding combination pill at this time (can be given individually if needed to add back) Afib: On eliquis COPD: duonebs PRN, IS, pulmonary toiletry   FEN/GI: Regular diet PPx: SCDs Dispo:Home pending clinical improvement   Subjective:  NAEON. Wants cigarettes this morning  Objective: Temp:  [97.4 F (36.3 C)-97.8 F (36.6 C)] 97.8 F (36.6 C) (01/31 0737) Pulse Rate:  [69-88] 71 (01/31 0737) Resp:  [16] 16 (01/31 0737) BP: (125-159)/(70-79) 144/79 (01/31 0737) SpO2:  [80 %-97 %] 97 % (01/31 0737) Physical Exam: General: No acute distress, chronically ill-appearing.  Bruising over left eye Cardiovascular: RRR, no m/r/g Respiratory: normal work of breathing on RA Abdomen: Normal bowel sounds Extremities: No swelling BLE.  Multiple superficial scattered abrasions to bilateral upper  extremities  Laboratory: Most recent CBC Lab Results  Component Value Date   WBC 12.1 (H) 09/04/2023   HGB 13.0 09/04/2023   HCT 39.5 09/04/2023   MCV 93.8 09/04/2023   PLT 197 09/04/2023   Most recent BMP    Latest Ref Rng & Units 09/04/2023    4:02 AM  BMP  Glucose 70 - 99 mg/dL 409   BUN 8 - 23 mg/dL 12   Creatinine 8.11 - 1.24 mg/dL 9.14   Sodium 782 - 956 mmol/L 137   Potassium 3.5 - 5.1 mmol/L 4.0   Chloride 98 - 111 mmol/L 104   CO2 22 - 32 mmol/L 20   Calcium 8.9 - 10.3 mg/dL 8.7    Imaging/Diagnostic Tests: MR Brain W WO Contrast 09/04/23 IMPRESSION: Peripherally enhancing masses in the bilateral posterior temporal and occipital lobes, with significant surrounding edema, 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  Vedha Tercero, DO 09/05/2023, 11:50 AM  PGY-1, [facility] FPTS Intern pager: [phone], text pages welcome Secure chat group [facility] Teaching Service

## 2023-09-05 NOTE — Assessment & Plan Note (Addendum)
"  Bilateral L4 and L5 pars interarticularis defects with grade 1 anterolisthesis of L5 on S1." Questionable of this contribution to falls. Not a good surgical candidate.  No pain at this time -Will continue to monitor for back pain or new weakness

## 2023-09-05 NOTE — Consult Note (Signed)
NEUROLOGY CONSULT NOTE   Date of service: September 05, 2023 Patient Name: Gerald Hardin. MRN:  [id number] DOB:  September 08, 1933 Chief Complaint: "brain mass" Requesting Provider: Westley Chandler, MD  History of Present Illness  Gerald Hardin. is a 89 y.o. male with hx of diabetes type 2, persistent A-fib on Eliquis, COPD, hyperlipidemia, hypertension, heart block status post pacemaker who was brought into the ED as a level 2 trauma for multiple falls.  At this time, patient is confused/encephalopathic and unable to provide any meaningful history.  History is obtained from chart review, I was unable to get in touch with family to obtain history overnight.  Confusion reported to the family that he had 3 falls over the last week or 2.  He has been taken Eliquis every day twice a day.  He came to the ED and had workup with Cts which were notable for extensive vasogenic edema posteriorly bilateral cerebral hemispheres with evidence of underlying masses.  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  Neurology was consulted for further evaluation and workup.  Labs with slight leukocytosis, vitals normal with no fever.  No other clear signs of sepsis.    ROS  Unable to ascertain due to somnolence/lethargic/encephalopathy.  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   Cataracts, bilateral    CHB (complete heart block) (HCC) nov 2011   CKD (chronic kidney disease), stage II    COPD (chronic obstructive pulmonary disease) (HCC)    Diabetes mellitus    DVT of upper extremity (deep vein thrombosis) (HCC)    post pacer   Hyperlipidemia    Hypertension    Macular degeneration    Pacemaker    st judes     Past Surgical History:  Procedure Laterality Date   PACEMAKER PLACEMENT  Jul 02 2010   [facility] GENERATOR CHANGEOUT N/A 02/23/2021   Procedure: PPM GENERATOR CHANGEOUT;  Surgeon: Duke Salvia, MD;  Location: [facility]VAS[facility] CV LAB;  Service: Cardiovascular;  Laterality: N/A;   SQUAMOUS CELL CARCINOMA EXCISION     and XRT   xrt      Family History: Family History  Problem Relation Age of Onset   Heart failure Father     Social History  reports that he has been smoking cigarettes. He has never used smokeless tobacco. He reports that he does not drink alcohol and does not use drugs.  Allergies  Allergen Reactions   Other Other (See Comments)    Other reaction(s): Cough, Other (See Comments) Freshly sprayed hairspray causes coughing, sneezing, and watery eyes. HAIR SPRAY CAUSES SNEEZING, WATERY EYES AND COUGH   Do not eat Red Beef and Popcorn    Chicken Meat (Diagnostic) Other (See Comments)    Foy [place] / Can eat on if cooked in non cholesterol oil   Egg-Derived Products Other (See Comments)    Yoke    Medications   Current Facility-Administered Medications:    acetaminophen (TYLENOL) tablet 650 mg, 650 mg, Oral, Q6H PRN, Jena Gauss, Allee, MD, 650 mg at 09/03/23 2215   fenofibrate tablet 54 mg, 54 mg, Oral, Daily, Maxwell, Allee, MD   ipratropium-albuterol (DUONEB) 0.5-2.5 (3) MG/3ML nebulizer solution 3 mL, 3 mL, Nebulization, Q6H PRN, Alfredo Martinez, MD   isosorbide  mononitrate (IMDUR) 24 hr tablet 30 mg, 30 mg, Oral, Daily, Maxwell, Allee, MD, 30 mg at 09-25-2023 2057   LORazepam (ATIVAN) injection 0.5 mg, 0.5 mg, Intravenous, Once PRN, Everhart, Kirstie, DO   simvastatin (ZOCOR) tablet 10 mg, 10 mg, Oral, Daily, Maxwell, Allee, MD  Facility-Administered Medications Ordered in Other Encounters:    regadenoson (LEXISCAN) injection SOLN 0.4 mg, 0.4 mg, Intravenous, Once, Chilton Si, MD   technetium tetrofosmin (TC-MYOVIEW) injection 31.9 millicurie, 31.9 millicurie,  Intravenous, Once PRN, Chilton Si, MD  Vitals   Vitals:   09/04/23 1509 09/04/23 1623 09/04/23 2127 09/05/23 0500  BP:   126/70 125/75  Pulse:   69 72  Resp:      Temp:  (!) 97.4 F (36.3 C)    TempSrc:  Oral  Oral  SpO2: 93%  97% 95%  Weight:      Height:        Body mass index is 31.38 kg/m.  Physical Exam   General: Laying comfortably in bed; in no acute distress.  HENT: Normal oropharynx and mucosa. Normal external appearance of ears and nose.  Neck: Supple, no pain or tenderness  CV: No JVD. No peripheral edema.  Pulmonary: Symmetric Chest rise. Normal respiratory effort.  Abdomen: Soft to touch, non-tender.  Ext: No cyanosis, edema, or deformity  Skin: No rash. Normal palpation of skin.   Musculoskeletal: Normal digits and nails by inspection. No clubbing.   Neurologic Examination  Mental status/Cognition: Somnolent and lethargic.  Briefly opens his eyes to vigorous tactile stimulation but unable to maintain wakefulness.  Unable to answer any orientation questions.  Poor attention. Speech/language: Reduced speech output secondary to encephalopathy/somnolence.  Did say "stop" when I was trying to wake him up.  Does not follow commands or answer any questions or identify any objects.  Cranial nerves:   CN II Pupils equal and reactive to light, unable to assess for visual field deficit.  Inconsistent blinks to visual threat in bilateral visual fields.   CN III,IV,VI EOMI intact to doll's eye.  No gaze preference or deviation.   CN V Corneals intact bilaterally   CN VII Symmetric facial grimace   CN VIII Made brief eye contact to loud voice.   CN IX & X Seems protecting his airway at this time.   CN XI Head is midline.   CN XII Unable to assess.   Motor:  Muscle bulk: Poor, tone normal Unable detailed strength testing secondary to somnolence/encephalopathy. Is moving all extremities spontaneously.  Sensation:  Light touch    Pin prick Localized to proximal  pinch in all extremities.   Temperature    Vibration   Proprioception    Coordination/Complex Motor:  Unable to assess. - Gait: Deferred for patient safety. Labs/Imaging/Neurodiagnostic studies   CBC:  Recent Labs  Lab Sep 25, 2023 1315 09/04/23 0402  WBC 13.5* 12.1*  NEUTROABS 10.3*  --   HGB 13.4 13.0  HCT 39.8 39.5  MCV 92.8 93.8  PLT 246 197   Basic Metabolic Panel:  Lab Results  Component Value Date   NA 137 09/04/2023   K 4.0 09/04/2023   CO2 20 (L) 09/04/2023   GLUCOSE 132 (H) 09/04/2023   BUN 12 09/04/2023   CREATININE 1.02 09/04/2023   CALCIUM 8.7 (L) 09/04/2023   GFRNONAA >60 09/04/2023   GFRAA 61 (L) 11/15/2014   Lipid Panel: No results found for: "LDLCALC" HgbA1c: No results found for: "HGBA1C" Urine Drug Screen: No results found for: "LABOPIA", "COCAINSCRNUR", "LABBENZ", "AMPHETMU", "THCU", "  LABBARB"  Alcohol Level No results found for: "ETH" INR  Lab Results  Component Value Date   INR 3.4 05/28/2011   APTT  Lab Results  Component Value Date   APTT 36 06/30/2010   AED levels: No results found for: "PHENYTOIN", "ZONISAMIDE", "LAMOTRIGINE", "LEVETIRACETA"  CT Head without contrast(Personally reviewed): 1. Extensive vasogenic edema posteriorly in both cerebral hemispheres with evidence of underlying masses. An MRI of the brain without and with contrast is recommended for further evaluation. 2. No acute cervical spine fracture.  MRI Brain(Personally reviewed): Peripherally enhancing masses in the bilateral posterior temporal and occipital lobes, with significant surrounding edema, 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  Neurodiagnostics rEEG:  pending  ASSESSMENT   Gerald Hardin. is a 89 y.o. male with hx of diabetes type 2, persistent A-fib on Eliquis, COPD, hyperlipidemia, hypertension, heart block status post pacemaker who was brought into the ED  as a level 2 trauma for multiple falls. He was found to have peripherally enhancing lesions in BL temporo-occipital lobes with significant surrounding edema.  Exam limited secondary to somnolence/delirium but is non focal but I suspect that he probably has some vision deficit, althou somnolence precludes assessment.  Other differential to consider include walled off asbcess, AIDs related CNS diseases including toxoplasmosis, cryptococcosis, lymphoma but also a tuberculoma. Less likely infarct, hematoma, no prior hx of radiation to his brain.  The appearance of these lesions do seem to be most consistent with a neoplasm rather than a infection/inflammation. He has no nuchal rigidity, no fever. RECOMMENDATIONS  - rEEG - Unable to obtain LP as he is on eliquis. Will need to be cautious and only attempt low volume LP due to noted severe edema. - Quantiferon, HIV Ab, Toxoplasma IgM and IgG, Cryptococcus Ag in blood. Procalcitonin. ______________________________________________________________________    Welton Flakes, MD [facility]

## 2023-09-05 NOTE — Assessment & Plan Note (Deleted)
 CT head with extensive vasogenic edema posteriorly in both cerebral hemispheres with evidence of underlying masses. Not seen on CT from 2016.  No obvious focal deficits on exam.  Concern for primary brain cancer or metastasis to brain from other cancer, does have history of squamous cell carcinoma and is a smoker -MRI brain further evaluation

## 2023-09-05 NOTE — Assessment & Plan Note (Addendum)
 CT pelvis: No acute intrathoracic, intra-abdominal, intrapelvic traumatic injury. Painful on examination without overt deformities or abnormalities. Limited ROM. - PT/OT

## 2023-09-05 NOTE — Consult Note (Addendum)
[facility] CONSULT NOTE  Patient Care Team: Patient, No Pcp Per as PCP - General (General Practice) Duke Salvia, MD as PCP - Electrophysiology (Cardiology)  CHIEF COMPLAINTS/PURPOSE OF CONSULTATION:  Masses in bilateral posterior temporal and occipital lobes favoring necrotic neoplasms.  REFERRING PHYSICIAN: Dr. Manson Passey  HISTORY OF PRESENTING ILLNESS:  Gerald Hardin. 89 y.o. male who was admitted on 09/03/2023 status post multiple falls.  Workup was done including CT of head which showed extensive vasogenic edema in both cerebral hemispheres with evidence of underlying masses.  Therefore oncology consult has been requested. Patient is seen and assessed today.  Awake and alert and confused.  Patient's wife is at bedside and is principal historian.  She confirms confusion at baseline.  Patient has poor vision due to macular degeneration and is also hard of hearing.  Spouse denies seizures, dizziness, nausea, vomiting.  Reports that he has SCC of right arm with subsequent excision in the axillary area.  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 Surgical history includes pacemaker.  SCC excision. Family history includes heart failure in patient's father. Social history includes tobacco use.  Denies alcohol use.  Denies drugs use.  Lives with wife.  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   I have reviewed his chart and materials related to his cancer extensively and collaborated history with the patient. Summary of oncologic history is as follows: Oncology History   No history exists.    ASSESSMENT & PLAN:  Brain masses Multiple pulmonary nodules -CT of head done 09/03/2023 showed extensive  vasogenic edema in both cerebral hemispheres with evidence of underlying masses. -MR of brain done 09/04/2023 shows peripherally enhancing masses in bilateral posterior temporal and occipital lobes with significant edema favoring necrotic neoplasms, presumably metastatic.  Although abscesses and infectious process cannot be excluded. -CT of chest showed multiple pulmonary nodules with most significant right solid pulmonary nodule 12 mm. - Neuro following - Palliative has been consulted. - Recommend goals of care discussions at this time. - Medical oncology/Dr. Al Pimple is following and will make further recommendations.  2.  Status post mechanical falls Confused - He is status post multiple falls at home - May be due to brain masses -Patient is aware of his name but is not oriented to time and place. - Continue fall protocols for safety  3.  History of squamous cell carcinoma of RUE  History of basal cell carcinoma -Right arm is contracted. - Per patient's wife he was treated at [facility] several years ago  -Also treated in [place] where they lived previously.  Spouse unsure of treatments however states that he did have surgery.  4.  Macular degeneration - Wife states he gets eye injections at [facility]  5.  A-fib - Has been on Eliquis - Managed by cardio    Orders Placed This Encounter  Procedures   LACERATION REPAIR    This order was created via procedure documentation    Standing Status:   Standing    Number of Occurrences:   1   DG Hand Complete Left    Standing Status:   Standing    Number of Occurrences:   1    Reason for Exam (SYMPTOM  OR DIAGNOSIS  REQUIRED):   fall   DG Knee Complete 4 Views Left    Standing Status:   Standing    Number of Occurrences:   1    Reason for Exam (SYMPTOM  OR DIAGNOSIS REQUIRED):   fall   DG Pelvis Portable    Standing Status:   Standing    Number of Occurrences:   1    Reason for Exam (SYMPTOM  OR DIAGNOSIS REQUIRED):   fall   DG Chest Portable  1 View    Standing Status:   Standing    Number of Occurrences:   1    Reason for Exam (SYMPTOM  OR DIAGNOSIS REQUIRED):   fall   CT HEAD WO CONTRAST ( )    Standing Status:   Standing    Number of Occurrences:   1   CT Cervical Spine Wo Contrast    Standing Status:   Standing    Number of Occurrences:   1   CT CHEST ABDOMEN PELVIS W CONTRAST    Standing Status:   Standing    Number of Occurrences:   1    Does the patient have a contrast media/X-ray dye allergy?:   No    If indicated for the ordered procedure, I authorize the administration of oral contrast media per Radiology protocol:   Yes   CT L-SPINE NO CHARGE    Standing Status:   Standing    Number of Occurrences:   1    Symptom/Reason for Exam:   Pain [161096]    Radiology Contrast Protocol - do NOT remove file path:   [email or web address]   MR Brain W and Wo Contrast    Standing Status:   Standing    Number of Occurrences:   1    If indicated for the ordered procedure, I authorize the administration of contrast media per Radiology protocol:   Yes    What is the patient's sedation requirement?:   No Sedation    Does the patient have a pacemaker or implanted devices?:   Yes    Manufacturer of pacemake or implanted device?:   [employer] [id number] [employer] MRI    Year of pacemaker or implanted device?:   07/02/2010    Radiology Contrast Protocol - do NOT remove file path:   [email or web address]   CBC with Differential    Standing Status:   Standing    Number of Occurrences:   1   Comprehensive metabolic panel    Standing Status:   Standing    Number of Occurrences:   1   Urinalysis, w/ Reflex to Culture (Infection Suspected) -Urine, Clean Catch    Standing Status:   Standing    Number of Occurrences:   1    Specimen Source:   Urine, Clean Catch [76]   CK    Standing Status:   Standing    Number of Occurrences:   1   CBC    Standing Status:   Standing     Number of Occurrences:   1   Basic metabolic panel    Standing Status:   Standing    Number of Occurrences:   1   Magnesium    Standing Status:   Standing    Number of Occurrences:   1   VITAMIN D 25 Hydroxy (Vit-D Deficiency, Fractures)    Standing Status:   Standing    Number of Occurrences:   1   Vitamin B12    Standing Status:   Standing  Number of Occurrences:   1   Vitamin A    Standing Status:   Standing    Number of Occurrences:   1   Lactic acid, plasma    Standing Status:   Standing    Number of Occurrences:   1   Hemoglobin A1c    Standing Status:   Standing    Number of Occurrences:   1   CBC    Standing Status:   Standing    Number of Occurrences:   1   Basic metabolic panel    Standing Status:   Standing    Number of Occurrences:   1   HIV Antibody (routine testing w rflx)    Standing Status:   Standing    Number of Occurrences:   1   Toxoplasma antibodies- IgG and  IgM    Standing Status:   Standing    Number of Occurrences:   1   QuantiFERON-TB Gold Plus    Standing Status:   Standing    Number of Occurrences:   1   Cryptococcal antigen    Standing Status:   Standing    Number of Occurrences:   1   Procalcitonin    Standing Status:   Standing    Number of Occurrences:   1   TSH    Standing Status:   Standing    Number of Occurrences:   1    Specimen collection method:   Lab=Lab collect   RPR    Standing Status:   Standing    Number of Occurrences:   1    Specimen collection method:   Lab=Lab collect   Diet regular Room service appropriate? Yes; Fluid consistency: Thin    Standing Status:   Standing    Number of Occurrences:   1    Room service appropriate?:   Yes    Fluid consistency::   Thin   Vital signs    Standing Status:   Standing    Number of Occurrences:   1   Notify physician (specify)    Standing Status:   Standing    Number of Occurrences:   20    Notify Physician:   for pulse less than 55 or greater than 120    Notify  Physician:   for respiratory rate less than 12 or greater than 25    Notify Physician:   for temperature greater than 100.5 F    Notify Physician:   for urinary output less than 30 mL/hr for four hours    Notify Physician:   for systolic BP less than 90 or greater than 160, diastolic BP less than 60 or greater than 100    Notify Physician:   for new hypoxia w/ oxygen saturations < 88%   Mobility Protocol: No Restrictions RN to initiate protocols based on patient's level of care    RN to initiate protocols based on patient's level of care    Standing Status:   Standing    Number of Occurrences:   1   Refer to Sidebar Report Refer to ICU, Med-Surg, Progressive, and Step-Down Mobility Protocol Sidebars    Refer to ICU, Med-Surg, Progressive, and Step-Down Mobility Protocol Sidebars    Standing Status:   Standing    Number of Occurrences:   1   Initiate Adult Central Line Maintenance and Catheter Protocol for patients with central line (CVC, PICC, Port, Hemodialysis, Trialysis)    Standing Status:   Standing  Number of Occurrences:   1   Initiate Oral Care Protocol    Standing Status:   Standing    Number of Occurrences:   1   Initiate Carrier Fluid Protocol    Standing Status:   Standing    Number of Occurrences:   1   RN may order General Admission PRN Orders utilizing "General Admission PRN medications" (through manage orders) for the following patient needs: allergy symptoms (Claritin), cold sores (Carmex), cough (Robitussin DM), eye irritation (Liquifilm Tears), hemorrhoids (Tucks), indigestion (Maalox), minor skin irritation (Hydrocortisone Cream), muscle pain Romeo Apple Gay), nose irritation (saline nasal spray) and sore throat (Chloraseptic spray).    Standing Status:   Standing    Number of Occurrences:   [id number]   Cardiac Monitoring Continuous x 24 hours Indications for use: Other; other indications for use: fall    Standing Status:   Standing    Number of Occurrences:   1    Indications  for use::   Other    other indications for use::   fall   Interrogate Pacemaker if present    Standing Status:   Standing    Number of Occurrences:   1   Place and maintain sequential compression device    Standing Status:   Standing    Number of Occurrences:   1   Cardiac Monitoring Continuous x 24 hours Indications for use: Other; other indications for use: fall    Standing Status:   Standing    Number of Occurrences:   1    Indications for use::   Other    other indications for use::   fall   Full code    Standing Status:   Standing    Number of Occurrences:   1    By::   Consent: discussion documented in EHR   Consult for Unassigned Medical Admission    Standing Status:   Standing    Number of Occurrences:   1    Place call to::   on call for unassigned    Reason for Consult:   Admit   Consult to palliative care    **please only talk to wife and grandson first, multiple necrotic brain masses suspected to be cancer, pt is confused and gets agitated, wife aware he will need hospice    Standing Status:   Standing    Number of Occurrences:   1    Palliative Care Consult Services:   Palliative Medicine Consult    Reason for Consult?:   brain masses, see comment   OT eval and treat    Standing Status:   Standing    Number of Occurrences:   1   PT eval and treat    Standing Status:   Standing    Number of Occurrences:   1   Oxygen therapy Mode or (Route): Nasal cannula; Liters Per Minute: 2; Keep O2 saturation between: greater than 92 %    Standing Status:   Standing    Number of Occurrences:   1    Mode or (Route):   Nasal cannula    Liters Per Minute:   2    Keep O2 saturation between:   greater than 92 %   I-Stat CG4 Lactic Acid, ED    Standing Status:   Standing    Number of Occurrences:   1   ED EKG    Standing Status:   Standing    Number of Occurrences:   1  Reason for Exam:   Chest Pain   ECHOCARDIOGRAM COMPLETE    Standing Status:   Standing    Number of  Occurrences:   1    Perflutren DEFINITY (image enhancing agent) should be administered unless hypersensitivity or allergy exist:   Administer Perflutren    Reason for exam-Echo:   Atrial Fibrillation  I48.91   EEG adult    Standing Status:   Standing    Number of Occurrences:   1    Reason for exam:   Altered mental status   Admit to Inpatient (patient's expected length of stay will be greater than 2 midnights or inpatient only procedure)    Standing Status:   Standing    Number of Occurrences:   1    Hospital Area:   [facility] [[id number]]    Level of Care:   Telemetry Medical [104]    May admit patient to [facility] or [facility] if equivalent level of care is available::   No    Covid Evaluation:   Asymptomatic - no recent exposure (last 10 days) testing not required    Diagnosis:   Fall [290176]    Admitting Physician:   Alfredo Martinez [[id number]]    Attending Physician:   Westley Chandler [[id number]]    Certification::   I certify this patient will need inpatient services for at least 2 midnights    Expected Medical Readiness:   09/05/2023   Fall precautions    Standing Status:   Standing    Number of Occurrences:   1     MEDICAL HISTORY:  Past Medical History:  Diagnosis Date   CA - cancer    Right Axillary Squamous Cell   Cataracts, bilateral    CHB (complete heart block) (HCC) nov 2011   CKD (chronic kidney disease), stage II    COPD (chronic obstructive pulmonary disease) (HCC)    Diabetes mellitus    DVT of upper extremity (deep vein thrombosis) (HCC)    post pacer   Hyperlipidemia    Hypertension    Macular degeneration    Pacemaker    [facility]    SURGICAL HISTORY: Past Surgical History:  Procedure Laterality Date   PACEMAKER PLACEMENT  Jul 02 2010   [facility] GENERATOR CHANGEOUT N/A 02/23/2021   Procedure: PPM GENERATOR CHANGEOUT;  Surgeon: Duke Salvia, MD;  Location: [facility] INVASIVE CV LAB;  Service: Cardiovascular;  Laterality: N/A;   SQUAMOUS CELL  CARCINOMA EXCISION     and XRT   xrt      SOCIAL HISTORY: Social History   Socioeconomic History   Marital status: Married    Spouse name: Not on file   Number of children: Not on file   Years of education: Not on file   Highest education level: Not on file  Occupational History   Not on file  Tobacco Use   Smoking status: Smoker, Current Status Unknown    Current packs/day: 1.00    Types: Cigarettes   Smokeless tobacco: Never  Substance and Sexual Activity   Alcohol use: No   Drug use: No   Sexual activity: Not on file  Other Topics Concern   Not on file  Social History Narrative   Not on file   Social Drivers of Health   Financial Resource Strain: Not on file  Food Insecurity: No Food Insecurity (09/04/2023)   Hunger Vital Sign    Worried About Running Out of Food in the Last Year:  Never true    Ran Out of Food in the Last Year: Never true  Transportation Needs: No Transportation Needs (09/04/2023)   PRAPARE - Administrator, Civil Service (Medical): No    Lack of Transportation (Non-Medical): No  Physical Activity: Not on file  Stress: Not on file  Social Connections: Unknown (09/04/2023)   Social Connection and Isolation Panel [NHANES]    Frequency of Communication with Friends and Family: More than three times a week    Frequency of Social Gatherings with Friends and Family: More than three times a week    Attends Religious Services: Patient declined    Database administrator or Organizations: Patient declined    Attends Banker Meetings: Patient declined    Marital Status: Married  Catering manager Violence: Unknown (09/04/2023)   Humiliation, Afraid, Rape, and Kick questionnaire    Fear of Current or Ex-Partner: No    Emotionally Abused: Patient declined    Physically Abused: Patient declined    Sexually Abused: Patient declined    FAMILY HISTORY: Family History  Problem Relation Age of Onset   Heart failure Father     REVIEW  OF SYSTEMS: Unable to obtain No acute distress is noted    PHYSICAL EXAMINATION: ECOG PERFORMANCE STATUS: 4 - Bedbound  Vitals:   09/05/23 0737 09/05/23 1209  BP: (!) 144/79 (!) 150/53  Pulse: 71 70  Resp: 16 17  Temp: 97.8 F (36.6 C) 97.8 F (36.6 C)  SpO2: 97% 94%   Filed Weights   09/03/23 1306 09/03/23 1314  Weight: 225 lb (102.1 kg) 225 lb (102.1 kg)    GENERAL: alert, + ill-appearing +frail +hard of hearing +right arm contracted +left arm contracted SKIN: +Mottled skin color, + poor turgor are normal EYES: +Scleral icterus OROPHARYNX: no exudate, no erythema and lips, buccal mucosa, and tongue normal  NECK: supple, thyroid normal size, non-tender, without nodularity LYMPH: no palpable lymphadenopathy in the cervical, axillary or inguinal LUNGS: clear to auscultation and percussion with normal breathing effort HEART: regular rate & rhythm and no murmurs and no lower extremity edema ABDOMEN: abdomen soft, non-tender and normal bowel sounds MUSCULOSKELETAL: no cyanosis of digits and no clubbing  PSYCH: +Oriented to name only NEURO: no focal motor/sensory deficits   ALLERGIES:  is allergic to other, chicken meat (diagnostic), and egg-derived products.  MEDICATIONS:  Current Facility-Administered Medications  Medication Dose Route Frequency Provider Last Rate Last Admin   acetaminophen (TYLENOL) tablet 650 mg  650 mg Oral Q6H PRN Alfredo Martinez, MD   650 mg at 09/03/23 2215   apixaban (ELIQUIS) tablet 5 mg  5 mg Oral BID Levin Erp, MD       fenofibrate tablet 54 mg  54 mg Oral Daily Maxwell, Allee, MD       ipratropium-albuterol (DUONEB) 0.5-2.5 (3) MG/3ML nebulizer solution 3 mL  3 mL Nebulization Q6H PRN Alfredo Martinez, MD       isosorbide mononitrate (IMDUR) 24 hr tablet 30 mg  30 mg Oral Daily Maxwell, Allee, MD   30 mg at 09/03/23 2057   levETIRAcetam (KEPPRA) 2,000 mg in sodium chloride 0.9 % 250 mL IVPB  2,000 mg Intravenous Once Jefferson Fuel, MD        Followed by   Melene Muller ON 09/06/2023] levETIRAcetam (KEPPRA) IVPB 500 mg/100 mL premix  500 mg Intravenous Q12H Jefferson Fuel, MD       LORazepam (ATIVAN) injection 0.5 mg  0.5 mg Intravenous Once PRN Everhart,  Kirstie, DO       nicotine (NICODERM CQ - dosed in mg/24 hours) patch 21 mg  21 mg Transdermal Daily Everhart, Kirstie, DO       simvastatin (ZOCOR) tablet 10 mg  10 mg Oral Daily Alfredo Martinez, MD   10 mg at 09/05/23 1133   Facility-Administered Medications Ordered in Other Encounters  Medication Dose Route Frequency Provider Last Rate Last Admin   regadenoson (LEXISCAN) injection SOLN 0.4 mg  0.4 mg Intravenous Once Chilton Si, MD       technetium tetrofosmin (TC-MYOVIEW) injection 31.9 millicurie  31.9 millicurie Intravenous Once PRN Chilton Si, MD         LABORATORY DATA:  I have reviewed the data as listed Lab Results  Component Value Date   WBC 12.1 (H) 09/04/2023   HGB 13.0 09/04/2023   HCT 39.5 09/04/2023   MCV 93.8 09/04/2023   PLT 197 09/04/2023   Recent Labs    09/03/23 1350 09/04/23 0402  NA 135 137  K 4.2 4.0  CL 105 104  CO2 19* 20*  GLUCOSE 161* 132*  BUN 14 12  CREATININE 1.26* 1.02  CALCIUM 8.6* 8.7*  GFRNONAA 55* >60  PROT 5.9*  --   ALBUMIN 3.3*  --   AST 34  --   ALT 20  --   ALKPHOS 39  --   BILITOT 0.9  --     RADIOGRAPHIC STUDIES: I have personally reviewed the radiological images as listed and agreed with the findings in the report. EEG adult Result Date: 09/05/2023 Charlsie Quest, MD     09/05/2023  2:37 PM Patient Name: Ludie Pavlik. MRN: [id number] Epilepsy Attending: Charlsie Quest Referring Physician/Provider: Erick Blinks, MD Date: 09/05/2023 Duration: 27.34 mins Patient history: 89yo F with fall, CTH with edema concerning for tumor. EEG to evaluate for seizure Level of alertness: Awake, asleep AEDs during EEG study: None Technical aspects: This EEG study was done with scalp electrodes positioned  according to the 10-20 International system of electrode placement. Electrical activity was reviewed with band pass filter of 1-70Hz , sensitivity of 7 uV/mm, display speed of 66mm/sec with a 60Hz  notched filter applied as appropriate. EEG data were recorded continuously and digitally stored.  Video monitoring was available and reviewed as appropriate. Description: The posterior dominant rhythm consists of 8 Hz activity of moderate voltage (25-35 uV) seen predominantly in posterior head regions, symmetric and reactive to eye opening and eye closing. Sleep was characterized by vertex waves, sleep spindles (12 to 14 Hz), maximal frontocentral region. EEG showed continuous 5 to 6 Hz theta slowing in bilateral posterior quadrant. Sharp waves were noted in left occipital region. Hyperventilation and photic stimulation were not performed.   ABNORMALITY - Sharp wave, left occipital region - Continuous slow, bilateral posterior quadrant IMPRESSION: This study showed evidence of epileptogenicity arising from left occipital region. Additionally there is cortical dysfunction arising from bilateral posterior quadrant likely secondary to underlying structural abnormality. No seizures were seen throughout the recording. Charlsie Quest   ECHOCARDIOGRAM COMPLETE Result Date: 09/05/2023    ECHOCARDIOGRAM REPORT   Patient Name:   Thelbert Gartin. Date of Exam: 09/05/2023 Medical Rec #:  [id number]         Height:       71.0 in Accession #:    [id number]        Weight:       225.0 lb Date of Birth:  07-13-1934  BSA:          2.217 m Patient Age:    89 years          BP:           144/79 mmHg Patient Gender: M                 HR:           70 bpm. Exam Location:  Inpatient Procedure: 2D Echo, Cardiac Doppler, Color Doppler and Intracardiac            Opacification Agent Indications:    Atrial Fibrillation  History:        Patient has no prior history of Echocardiogram examinations.                 Arrythmias:Atrial  Fibrillation, Signs/Symptoms:Syncope; Risk                 Factors:Hypertension.  Sonographer:    Webb Laws Referring Phys: Alfredo Martinez IMPRESSIONS  1. Technically difficult study with poor visualization of cardiac structures.  2. Left ventricular ejection fraction, by estimation, is 60 to 65%. The left ventricle has normal function. The left ventricle has no regional wall motion abnormalities. Left ventricular diastolic function could not be evaluated.  3. Right ventricular systolic function is normal. The right ventricular size is mildly enlarged.  4. The mitral valve is normal in structure. No evidence of mitral valve regurgitation. No evidence of mitral stenosis.  5. Tricuspid valve regurgitation is moderate.  6. The aortic valve is normal in structure. Aortic valve regurgitation is not visualized. No aortic stenosis is present. FINDINGS  Left Ventricle: Left ventricular ejection fraction, by estimation, is 60 to 65%. The left ventricle has normal function. The left ventricle has no regional wall motion abnormalities. Definity contrast agent was given IV to delineate the left ventricular  endocardial borders. The left ventricular internal cavity size was normal in size. There is no left ventricular hypertrophy. Left ventricular diastolic function could not be evaluated due to nondiagnostic images. Left ventricular diastolic function could not be evaluated. Right Ventricle: The right ventricular size is mildly enlarged. No increase in right ventricular wall thickness. Right ventricular systolic function is normal. Left Atrium: Left atrial size was normal in size. Right Atrium: Right atrial size was normal in size. Pericardium: There is no evidence of pericardial effusion. Mitral Valve: The mitral valve is normal in structure. Mild mitral annular calcification. No evidence of mitral valve regurgitation. No evidence of mitral valve stenosis. Tricuspid Valve: The tricuspid valve is normal in structure.  Tricuspid valve regurgitation is moderate . No evidence of tricuspid stenosis. Aortic Valve: The aortic valve is normal in structure. Aortic valve regurgitation is not visualized. No aortic stenosis is present. Pulmonic Valve: The pulmonic valve was not well visualized. Pulmonic valve regurgitation is not visualized. No evidence of pulmonic stenosis. Aorta: The aortic root is normal in size and structure. IAS/Shunts: No atrial level shunt detected by color flow Doppler.  LEFT VENTRICLE PLAX 2D LVIDd:         5.20 cm     Diastology LVIDs:         3.70 cm     LV e' medial:    5.77 cm/s LV PW:         1.00 cm     LV E/e' medial:  12.8 LV IVS:        0.90 cm     LV e' lateral:   3.97 cm/s  LV E/e' lateral: 18.6  LV Volumes (MOD) LV vol d, MOD A4C: 67.0 ml LV vol s, MOD A4C: 27.9 ml LV SV MOD A4C:     67.0 ml RIGHT VENTRICLE RV S prime:     6.67 cm/s LEFT ATRIUM         Index       RIGHT ATRIUM           Index LA diam:    4.10 cm 1.85 cm/m  RA Area:     18.00 cm                                 RA Volume:   52.30 ml  23.59 ml/m   AORTA Ao Root diam: 2.80 cm MITRAL VALVE               TRICUSPID VALVE MV Area (PHT): 2.20 cm    TR Peak grad:   26.0 mmHg MV Decel Time: 345 msec    TR Vmax:        255.00 cm/s MV E velocity: 73.90 cm/s Aditya Sabharwal Electronically signed by Dorthula Nettles Signature Date/Time: 09/05/2023/10:51:17 AM    Final    MR Brain W and Wo Contrast Result Date: 09/04/2023 CLINICAL DATA:  Masses seen on CT head EXAM: MRI HEAD WITHOUT AND WITH CONTRAST TECHNIQUE: Multiplanar, multiecho pulse sequences of the brain and surrounding structures were obtained without and with intravenous contrast. CONTRAST:  10mL GADAVIST GADOBUTROL 1 MMOL/ML IV SOLN COMPARISON:  No prior MRI available, correlation is made with 09/03/2023 CT head FINDINGS: Brain: Peripherally enhancing masses in the bilateral posterior temporal and occipital lobes, which demonstrate mild peripheral  enhancement, peripheral restricted diffusion, and central T2 hyperintensity. Some hemosiderin deposition is noted within the walls. Significant surrounding T2 hyperintense signal, consistent with edema. Mass effect on the atria and occipital horns of the lateral ventricles, without evidence of entrapment hydrocephalus. No acute hemorrhage or midline shift. No extra-axial collection. Pituitary and craniocervical junction within normal limits. Vascular: Normal arterial flow voids. Normal arterial and venous enhancement. Skull and upper cervical spine: Normal marrow signal. Sinuses/Orbits: Clear paranasal sinuses. No acute finding in the orbits. Status post bilateral lens replacements. Other: Fluid in bilateral mastoid air cells. IMPRESSION: Peripherally enhancing masses in the bilateral posterior temporal and occipital lobes, with significant surrounding edema, 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 Electronically Signed   By: Wiliam Ke M.D.   On: 09/04/2023 17:10   CT L-SPINE NO CHARGE Result Date: 09/03/2023 CLINICAL DATA:  [id number] Pain [id number] EXAM: CT LUMBAR SPINE WITHOUT CONTRAST TECHNIQUE: Multidetector CT imaging of the lumbar spine was performed without intravenous contrast administration. Multiplanar CT image reconstructions were also generated. RADIATION DOSE REDUCTION: This exam was performed according to the departmental dose-optimization program which includes automated exposure control, adjustment of the mA and/or kV according to patient size and/or use of iterative reconstruction technique. COMPARISON:  None Available. FINDINGS: Segmentation: 5 non-rib-bearing lumbar vertebral bodies. Alignment: Dextroscoliosis of the thoracolumbar spine. Grade 1 anterolisthesis of L5 on S1 with associated bilateral L5 pars interarticularis defects. Bilateral L4 pars interarticularis defects. Interarticularis defects. Mild  retrolisthesis of L3 on L4. Vertebrae: Severe degenerative changes of the lumbar spine. L3-L4 endplate sclerosis. Partial fusion of the L2-L3 levels. Old healed right L2 transverse process fracture. No acute fracture or focal pathologic process. Paraspinal and other soft tissues: Negative. Disc levels:  Multilevel intervertebral disc space vacuum phenomenon. IMPRESSION: 1. No acute displaced fracture or traumatic listhesis of the lumbar spine. 2. Bilateral L4 and L5 pars interarticularis defects with grade 1 anterolisthesis of L5 on S1. 3. Dextroscoliosis of the thoracolumbar spine. 4. Severe degenerative changes of the spine. Electronically Signed   By: Tish Frederickson M.D.   On: 09/03/2023 19:34   CT CHEST ABDOMEN PELVIS W CONTRAST Result Date: 09/03/2023 CLINICAL DATA:  Polytrauma, blunt t is on eliquis. Pt has left laceration to head and bruising around left eye and abrasions to left hand. VSS. Axox4. EXAM: CT CHEST, ABDOMEN, AND PELVIS WITH CONTRAST TECHNIQUE: Multidetector CT imaging of the chest, abdomen and pelvis was performed following the standard protocol during bolus administration of intravenous contrast. RADIATION DOSE REDUCTION: This exam was performed according to the departmental dose-optimization program which includes automated exposure control, adjustment of the mA and/or kV according to patient size and/or use of iterative reconstruction technique. CONTRAST:  75mL OMNIPAQUE IOHEXOL 350 MG/ML SOLN COMPARISON:  None Available. FINDINGS: CHEST: Cardiovascular: No aortic injury. The thoracic aorta is normal in caliber. The heart is normal in size. No significant pericardial effusion. Severe atherosclerotic plaque. Aortic valve leaflet calcification. Four-vessel coronary calcification. Mitral annular calcification. Mediastinum/Nodes: No pneumomediastinum. No mediastinal hematoma. The esophagus is unremarkable. The thyroid is unremarkable. The central airways are patent. No mediastinal, hilar, or  axillary lymphadenopathy. Lungs/Pleura: Moderate emphysematous changes. Diffuse bronchial wall thickening. A 0.4 cm and 0.8 x 0.6 cm subpleural left lower lobe nodule (134, 5:150). A 1.2 x 0.8 cm subpleural pulmonary nodule within the right lobe along the major fissure. Couple right lower lobe subpleural nodules: 1 x 0.9 cm and 1.2 x 0.8 cm (5:116, 117). No focal consolidation. No pulmonary nodule. No pulmonary mass. No pulmonary contusion or laceration. No pneumatocele formation. No pleural effusion. No pneumothorax. No hemothorax. Musculoskeletal/Chest wall: No chest wall mass. Acute left sixth rib fracture. No sternal fracture. No spinal fracture. Degenerative changes of bilateral shoulders. Degenerative changes of the right hand carpal bones. ABDOMEN / PELVIS: Hepatobiliary: Not enlarged. No focal lesion. No laceration or subcapsular hematoma. The gallbladder is otherwise unremarkable with no radio-opaque gallstones. No biliary ductal dilatation. Pancreas: Normal pancreatic contour. No main pancreatic duct dilatation. Spleen: Not enlarged. No focal lesion. No laceration, subcapsular hematoma, or vascular injury. Adrenals/Urinary Tract: No nodularity bilaterally. Bilateral kidneys enhance symmetrically. No hydronephrosis. No contusion, laceration, or subcapsular hematoma. Fluid in lesions of the kidneys likely represent simple renal cysts. Simple renal cysts, in the absence of clinically indicated signs/symptoms, require no independent follow-up. No injury to the vascular structures or collecting systems. No hydroureter. The urinary bladder is unremarkable. Stomach/Bowel: No small or large bowel wall thickening or dilatation. Stool throughout the colon. The appendix is unremarkable. Vasculature/Lymphatics: Severe atherosclerotic plaque. No abdominal aorta or iliac aneurysm. No active contrast extravasation or pseudoaneurysm. No abdominal, pelvic, inguinal lymphadenopathy. Reproductive: Prostate enlarged measuring  up to 5.5 cm. Other: No simple free fluid ascites. No pneumoperitoneum. No hemoperitoneum. No mesenteric hematoma identified. No organized fluid collection. Musculoskeletal: No significant soft tissue hematoma. No acute pelvic fracture. No spinal fracture. Severe degenerative changes of the lumbar spine. Dextroscoliosis of the thoracolumbar spine. Grade 1 anterolisthesis of L5 on S1 with associated bilateral L5 pars interarticularis defects. Ports and Devices: Right chest wall dual lead pacemaker. IMPRESSION: 1. Acute left sixth rib fracture.  No associated pneumothorax. 2. No acute intrathoracic, intra-abdominal, intrapelvic traumatic injury. 3. No acute fracture or traumatic malalignment of the thoracic or lumbar  spine. 4. Multiple pulmonary nodules. Most significant: Right solid pulmonary nodule measuring 12 mm. Per Fleischner Society Guidelines, consider a non-contrast Chest CT at 3 months, a PET/CT, or tissue sampling. These guidelines do not apply to immunocompromised patients and patients with cancer. Follow up in patients with significant comorbidities as clinically warranted. For lung cancer screening, adhere to Lung-RADS guidelines. Reference: Radiology. 2017; 284(1):228-43. 5. Prostatomegaly. 6. Aortic Atherosclerosis (ICD10-I70.0) -severe, including mitral annular, four-vessel pulmonary artery, and aortic valve leaflet calcifications-correlate for aortic stenosis. 7.  Emphysema (ICD10-J43.9). 8. Grade 1 anterolisthesis of L5 on S1 with associated bilateral L5 pars interarticularis defects. Electronically Signed   By: Tish Frederickson M.D.   On: 09/03/2023 19:25   CT HEAD WO CONTRAST ( ) Result Date: 09/03/2023 CLINICAL DATA:  Polytrauma, blunt. Multiple recent falls. On Eliquis. Head laceration. EXAM: CT HEAD WITHOUT CONTRAST CT CERVICAL SPINE WITHOUT CONTRAST TECHNIQUE: Multidetector CT imaging of the head and cervical spine was performed following the standard protocol without intravenous contrast.  Multiplanar CT image reconstructions of the cervical spine were also generated. RADIATION DOSE REDUCTION: This exam was performed according to the departmental dose-optimization program which includes automated exposure control, adjustment of the mA and/or kV according to patient size and/or use of iterative reconstruction technique. COMPARISON:  Head CT 11/15/2014 FINDINGS: CT HEAD FINDINGS Brain: There is extensive vasogenic edema posteriorly in both cerebral hemispheres involving the parietal, occipital, and temporal lobes. There is evidence of an approximately 5 cm mass in the right temporal-occipital region, and there is evidence of a partially cystic mass on the left. There is regional sulcal effacement bilaterally with trace rightward midline shift. There is effacement of the posterior aspects of both lateral ventricles with minimal prominence of both temporal horns which may reflect early trapping. The third and fourth ventricles are unchanged in size. There is mild cerebral atrophy. No acute intracranial hemorrhage, extra-axial fluid collection, or definite acute large territory cortically based infarct is identified. Vascular: Calcified atherosclerosis at the skull base. No hyperdense vessel. Skull: No acute fracture or destructive lesion. Sinuses/Orbits: Clear paranasal sinuses. Chronic complete left mastoid air cell opacification and partial left middle ear opacification. Small right mastoid effusion. No visible nasopharyngeal mass. Bilateral cataract extraction. Other: None. CT CERVICAL SPINE FINDINGS Alignment: Trace anterolisthesis of C4 on C5, C6 on C7, and C7 on T1. Trace retrolisthesis of C5 on C6. Skull base and vertebrae: No acute fracture or suspicious osseous lesion. Soft tissues and spinal canal: No prevertebral fluid or swelling. No visible canal hematoma. Disc levels: Moderately advanced disc degeneration at C4-5 greater than C5-6 with right-sided neural foraminal stenosis at these levels.  No evidence of high-grade spinal stenosis. Upper chest: Emphysema. Other: Prominent atherosclerotic calcification at the carotid bifurcations. IMPRESSION: 1. Extensive vasogenic edema posteriorly in both cerebral hemispheres with evidence of underlying masses. An MRI of the brain without and with contrast is recommended for further evaluation. 2. No acute cervical spine fracture. Electronically Signed   By: Sebastian Ache M.D.   On: 09/03/2023 15:52   CT Cervical Spine Wo Contrast Result Date: 09/03/2023 CLINICAL DATA:  Polytrauma, blunt. Multiple recent falls. On Eliquis. Head laceration. EXAM: CT HEAD WITHOUT CONTRAST CT CERVICAL SPINE WITHOUT CONTRAST TECHNIQUE: Multidetector CT imaging of the head and cervical spine was performed following the standard protocol without intravenous contrast. Multiplanar CT image reconstructions of the cervical spine were also generated. RADIATION DOSE REDUCTION: This exam was performed according to the departmental dose-optimization program which includes automated exposure control, adjustment of the mA  and/or kV according to patient size and/or use of iterative reconstruction technique. COMPARISON:  Head CT 11/15/2014 FINDINGS: CT HEAD FINDINGS Brain: There is extensive vasogenic edema posteriorly in both cerebral hemispheres involving the parietal, occipital, and temporal lobes. There is evidence of an approximately 5 cm mass in the right temporal-occipital region, and there is evidence of a partially cystic mass on the left. There is regional sulcal effacement bilaterally with trace rightward midline shift. There is effacement of the posterior aspects of both lateral ventricles with minimal prominence of both temporal horns which may reflect early trapping. The third and fourth ventricles are unchanged in size. There is mild cerebral atrophy. No acute intracranial hemorrhage, extra-axial fluid collection, or definite acute large territory cortically based infarct is  identified. Vascular: Calcified atherosclerosis at the skull base. No hyperdense vessel. Skull: No acute fracture or destructive lesion. Sinuses/Orbits: Clear paranasal sinuses. Chronic complete left mastoid air cell opacification and partial left middle ear opacification. Small right mastoid effusion. No visible nasopharyngeal mass. Bilateral cataract extraction. Other: None. CT CERVICAL SPINE FINDINGS Alignment: Trace anterolisthesis of C4 on C5, C6 on C7, and C7 on T1. Trace retrolisthesis of C5 on C6. Skull base and vertebrae: No acute fracture or suspicious osseous lesion. Soft tissues and spinal canal: No prevertebral fluid or swelling. No visible canal hematoma. Disc levels: Moderately advanced disc degeneration at C4-5 greater than C5-6 with right-sided neural foraminal stenosis at these levels. No evidence of high-grade spinal stenosis. Upper chest: Emphysema. Other: Prominent atherosclerotic calcification at the carotid bifurcations. IMPRESSION: 1. Extensive vasogenic edema posteriorly in both cerebral hemispheres with evidence of underlying masses. An MRI of the brain without and with contrast is recommended for further evaluation. 2. No acute cervical spine fracture. Electronically Signed   By: Sebastian Ache M.D.   On: 09/03/2023 15:52   DG Pelvis Portable Result Date: 09/03/2023 CLINICAL DATA:  Fall. EXAM: PORTABLE PELVIS 1-2 VIEWS COMPARISON:  None Available. FINDINGS: Subtle cortical irregularity of the left iliopectineal line, near the level of the left proximal superior pubic ramus. No evidence of diastasis. The femoral heads are seated within the acetabula. The sacroiliac joints and pubic symphysis appear anatomically aligned. Mild-to-moderate degenerative changes of the left-greater-than-right hips. Vascular calcifications are noted. IMPRESSION: Subtle cortical irregularity of the left iliopectineal line, near the level of the left proximal superior pubic ramus, is equivocal for fracture.  Consider further evaluation with CT of the pelvis. Electronically Signed   By: Hart Robinsons M.D.   On: 09/03/2023 14:54   DG Hand Complete Left Result Date: 09/03/2023 CLINICAL DATA:  Fall and trauma to the left hand. EXAM: LEFT HAND - COMPLETE 3+ VIEW COMPARISON:  None Available. FINDINGS: No acute fracture or dislocation. The bones are osteopenic. The soft tissues are unremarkable. IMPRESSION: 1. No acute fracture or dislocation. 2. Osteopenia. Electronically Signed   By: Elgie Collard M.D.   On: 09/03/2023 14:53   DG Knee Complete 4 Views Left Result Date: 09/03/2023 CLINICAL DATA:  Fall and trauma to the left knee.  Pain. EXAM: LEFT KNEE - COMPLETE 4+ VIEW COMPARISON:  None Available. FINDINGS: There is no acute fracture or dislocation. The bones are osteopenic. There is mild arthritic changes with spurring. No significant joint effusion. The soft tissues are unremarkable. Small focus of calcification in the soft tissues of the posterior knee. IMPRESSION: 1. No acute fracture or dislocation. 2. Mild arthritic changes. Electronically Signed   By: Elgie Collard M.D.   On: 09/03/2023 14:52   DG  Chest Portable 1 View Result Date: 09/03/2023 CLINICAL DATA:  Fall. EXAM: PORTABLE CHEST 1 VIEW COMPARISON:  Chest radiograph dated July 03, 2010. FINDINGS: The heart size and mediastinal contours are within normal limits. Right subclavian AICD in place. Aortic atherosclerosis. Chronic appearing bilateral interstitial changes, most pronounced at the lung bases. Mild left basilar atelectasis/scarring. No focal consolidation, sizeable pleural effusion, or pneumothorax. Diffuse osseous demineralization. Subtle cortical irregularity of the left lateral sixth rib. IMPRESSION: 1. Subtle cortical irregularity of the left lateral sixth rib could represent a fracture. Recommend correlation with point tenderness. No pneumothorax. 2. Chronic appearing bilateral interstitial changes, most pronounced at the lung  bases. Electronically Signed   By: Hart Robinsons M.D.   On: 09/03/2023 14:46   CUP PACEART REMOTE DEVICE CHECK Result Date: 08/25/2023 Scheduled remote reviewed. Normal device function.  Presenting AF, burden 100% since April 2024.  Known persistent AF, on Eliquis per Epic. Next remote 91 days. - CS, [facility]    The total time spent in the appointment was 55 minutes encounter with patients including review of chart and various tests results, discussions about plan of care and coordination of care plan   All questions were answered. The patient knows to call the clinic with any problems, questions or concerns. No barriers to learning was detected.  Dawson Bills, NP 1/31/20253:17 PM   Attending Note  I personally saw the patient, reviewed the chart and examined the patient. The plan of care was discussed with the patient and the admitting team. I agree with the assessment and plan as documented above. Thank you very much for the consultation. Patient is unable to provide any history, no one at bedside, called wife and no answer. Med Onc was consulted to discuss recommendations since MR brain was concerning for metastasis. On MRI brain there appeared to be peripherally enhancing masses in the bilateral posterior temporal and occipital lobes with significant surrounding edema favored to represent necrotic neoplasms although abscess and infectious process cannot be excluded.  Systemic CT with noted acute left sixth rib fracture, multiple pulmonary nodules, largest is in the right lung measuring 12 mm otherwise no concerns for malignancy. Unfortunately it is extremely hard to determine if this is metastatic malignancy to the brain.  I do not believe the lung nodules indicate a primary tumor.  It is extremely unusual for a 12 mm lung nodule to metastasize to the brain without having mediastinal /regional lymphadenopathy.  Since infection can also have similar presentation in the brain, I would recommend  consulting infectious disease for additional recommendations.    If malignancy is confirmed, then it certainly makes sense to engage palliative care since he will not be a candidate for aggressive treatments.  Please reengage [place] when we have more information.

## 2023-09-05 NOTE — Plan of Care (Signed)
Spoke with wife and grandson regarding MRI findings. Discussed pt likely has poor prognosis given age and severity of his MRI findings, talked about how it is suspected to be neoplastic but also looking for infectious causes Wife is aware that pt will need to go home with hospice care, requesting to speak with both palliative care and oncology to get information/think about her options Both consults placed Wife requests Palliative speak with her and son prior to speaking with patient  Pt did not have capacity to make medical decisions when I saw him this morning

## 2023-09-05 NOTE — Progress Notes (Signed)
After waiting for ECHO to finish, Pt refused EEG until He spoke to or received the OK from his cardiologist Dr. Tollie Pizza. I explained to him the differences of EKG vs. EEG, so not to have any confusion. He was completely aware, as he is a retired Ph D/ Rx and understood. I inform the Press photographer, as well as Nurse [employer]. Nurse will attempt to speak with Dr. Tollie Pizza and determine the outcome. Will check back as schedule permits. Equipment was left set up and online in Patient room.

## 2023-09-06 DIAGNOSIS — R918 Other nonspecific abnormal finding of lung field: Secondary | ICD-10-CM | POA: Diagnosis not present

## 2023-09-06 DIAGNOSIS — G9389 Other specified disorders of brain: Secondary | ICD-10-CM | POA: Diagnosis not present

## 2023-09-06 LAB — TOXOPLASMA ANTIBODIES- IGG AND  IGM
Toxoplasma Antibody- IgM: <3 [AU]/ml (ref 0.0–7.9)
Toxoplasma IgG Ratio: <3 [IU]/mL (ref 0.0–7.1)

## 2023-09-06 LAB — RPR: RPR Ser Ql: NONREACTIVE

## 2023-09-06 LAB — TSH: TSH: 1.454 u[IU]/mL (ref 0.350–4.500)

## 2023-09-06 MED ORDER — APIXABAN 5 MG PO TABS
5.0000 mg | ORAL_TABLET | Freq: Two times a day (BID) | ORAL | Status: AC
  Administered 2023-09-06: 5 mg via ORAL
  Filled 2023-09-06: qty 1

## 2023-09-06 MED ORDER — LEVETIRACETAM 500 MG PO TABS
500.0000 mg | ORAL_TABLET | Freq: Two times a day (BID) | ORAL | Status: DC
  Administered 2023-09-06 – 2023-09-10 (×8): 500 mg via ORAL
  Filled 2023-09-06 (×8): qty 1

## 2023-09-06 NOTE — Assessment & Plan Note (Signed)
 Multiple superficial abrasions. Monitor and keep clean. On AC.

## 2023-09-06 NOTE — Progress Notes (Signed)
Daily Progress Note Intern Pager: [phone]  Patient name: Gerald Hardin. Medical record number: [id number] Date of birth: 14-Aug-1933 Age: 89 y.o. Gender: male  Primary Care Provider: Patient, No Pcp Per Consultants: neuro, oncology, palliative Code Status: Full  Pt Overview and Major Events to Date:  1/29- admitted 1/31- palliative and oncology consulted  Assessment and Plan:  89 year old male with history of SCC of the right upper extremity, A-fib on Eliquis, pacemaker due to complete heart block, presenting with frequent falls.  He was found to have peripherally enhancing masses on bilateral posterior temporal and occipital lobes with significant surrounding edema, favored to represent necrotic neoplasms versus abscesses or other infectious process.  Per family's recommendation palliative and oncology have been consulted.  Neurology remains on board. Assessment & Plan Fall Most likely due to brain masses discussed elsewhere.  Will continue to monitor for fall precautions and provide appropriate care. -PT OT treatment Brain mass Continuing to follow-up on neuro workup to differentiate between suspected neoplasm versus possible infectious sources.  Thus far workup has been negative.  EEG was completed yesterday with abnormalities suggestive of structural cause, continue with recommendations as below. We are also working him up for reversible causes of dementia based on this new finding of mass.  -Neuro recommendations (suspect neoplasm) - quantiferon,Toxoplasma IgM and IgG still pending -RPR also added on and pending -Cryptococcus and HIV negative - Oncology consulted - Palliative care consulted - Cardiac Tele - Interrogate pacer  - Fall precautions  - regular diet  Superficial abrasion Multiple superficial abrasions. Monitor and keep clean. On AC.   Laceration of head Dermabond from EDP.   Chronic and Stable Issues: Right hip and shoulder pain-no acute injury or  deformities, monitor and treat symptomatically Rib fracture-symptomatic management, incentive spirometry and pulmonary toilet treat to prevent atelectasis Stable pars defects L4-L5 -continue to monitor for new pain weakness Pulmonary nodules-incidental finding on CT chest, outpatient follow-up Polypharmacy-outpatient med rec of multiple supplements Complete heart block with pacemaker/persistent AF on Eliquis-managed with home medications Hypertension: Continue Imdur 30 mg, holding trandolapril-verapamil 4-240 mg COPD: DuoNebs as needed, IS, pulmonary toiletry  FEN/GI: Regular diet PPx: SCDs Dispo:Home pending clinical improvement .    Subjective:  Patient reports that he is feeling as well as can be expected in this situation.  Remains pleasantly demented and rambling about coffee this morning.  Objective: Temp:  [96.8 F (36 C)-97.8 F (36.6 C)] 97.5 F (36.4 C) (02/01 0359) Pulse Rate:  [67-72] 72 (02/01 0359) Resp:  [17] 17 (02/01 0359) BP: (124-150)/(53-103) 133/60 (02/01 0359) SpO2:  [92 %-94 %] 92 % (02/01 0359) Physical Exam: General: Elderly appearing gentleman, no distress Cardiovascular: RRR, no M/R/G Respiratory: CTAB, no increased work of breathing Abdomen: Flat, soft, nontender Extremities: Moves bilateral upper extremities appropriately, reduced bulk and tone.  Laboratory: Most recent CBC Lab Results  Component Value Date   WBC 13.2 (H) 09/05/2023   HGB 12.0 (L) 09/05/2023   HCT 35.2 (L) 09/05/2023   MCV 91.7 09/05/2023   PLT 202 09/05/2023   Most recent BMP    Latest Ref Rng & Units 09/05/2023    7:14 PM  BMP  Glucose 70 - 99 mg/dL 413   BUN 8 - 23 mg/dL 28   Creatinine 2.44 - 1.24 mg/dL 0.10   Sodium 272 - 536 mmol/L 138   Potassium 3.5 - 5.1 mmol/L 4.3   Chloride 98 - 111 mmol/L 107   CO2 22 - 32 mmol/L 22  Calcium 8.9 - 10.3 mg/dL 8.7     Other pertinent labs TSH 1.454 Crypto antigen negative HIV nonreactive  Gerrit Heck,  DO 09/06/2023, 7:43 AM  PGY-1, [facility] FPTS Intern pager: [phone], text pages welcome Secure chat group [facility] Teaching Service

## 2023-09-06 NOTE — Assessment & Plan Note (Deleted)
 Multiple pulmonary nodules found incidentally on CT chest, right solid pulmonary nodule 12 mm. - Will need outpatient follow-up

## 2023-09-06 NOTE — Plan of Care (Signed)
Message cardiology on-call Dr. Elberta Fortis about oncology request to hold Eliquis for lumbar puncture.  He confirmed is appropriate to hold Eliquis for 3 days for the procedure.  Will hold Eliquis starting tomorrow, 2/2 and possibly plan for lumbar puncture early next week VIR.  Will place SCDs in the interim.  Elberta Fortis, MD 09/06/2023, 3:38 PM PGY-2, [facility] Service pager [phone]

## 2023-09-06 NOTE — Assessment & Plan Note (Deleted)
 CT pelvis: No acute intrathoracic, intra-abdominal, intrapelvic traumatic injury. Painful on examination without overt deformities or abnormalities. Limited ROM. - PT/OT

## 2023-09-06 NOTE — Progress Notes (Signed)
NEUROLOGY CONSULT FOLLOW UP NOTE   Date of service: September 06, 2023 Patient Name: Gerald Hardin. MRN:  [id number] DOB:  03-03-1934  Interval Hx/subjective  Patient has been hemodynamically stable and afebrile overnight.  His neurological exam is stable.  He has seen oncology regarding bilateral posterior temporal lobe masses, and is awaiting infectious disease consult.  Patient requests to be able to go outside and states that he is in the hospital for surgery on his right arm. Vitals   Vitals:   09/05/23 1209 09/05/23 2139 09/06/23 0359 09/06/23 0826  BP: (!) 150/53 (!) 124/103 133/60 (!) 127/58  Pulse: 70 67 72 70  Resp: 17 17 17 16   Temp: 97.8 F (36.6 C) (!) 96.8 F (36 C) (!) 97.5 F (36.4 C)   TempSrc: Oral     SpO2: 94% 93% 92% 100%  Weight:      Height:         Body mass index is 31.38 kg/m.  Physical Exam   Constitutional: Frail-appearing elderly patient in no acute distress Psych: Affect appropriate to situation.  Eyes: No scleral injection.  HENT: No OP obstrucion.  Head: Multiple bruises to head Respiratory: Effort normal, non-labored breathing.  Skin: WDI.   Neurologic Examination    NEURO:  Mental Status: Patient is alert and oriented to person and place but states that year is 12, month is November and then he is in the hospital for surgery on his right arm Speech/Language: speech is without dysarthria or aphasia.    Cranial Nerves:  II: PERRL.  III, IV, VI: EOMI. Eyelids elevate symmetrically.  V: Sensation is intact to light touch and symmetrical to face.  VII: Subtle right facial droop VIII: hearing intact to voice. IX, X: Phonation is normal.  WU:[id number] shrug 5/5. XII: tongue is midline without fasciculations. Motor: Able to move left upper extremity with good antigravity strength, able to raise right upper extremity off bed, but mobility is limited due to old injury, moves bilateral lower extremities but does not lift them off the  bed Tone: is normal and bulk is normal Sensation- Intact to light touch bilaterally.  Gait- deferred    Medications  Current Facility-Administered Medications:    acetaminophen (TYLENOL) tablet 650 mg, 650 mg, Oral, Q6H PRN, Alfredo Martinez, MD, 650 mg at 09/03/23 2215   apixaban (ELIQUIS) tablet 5 mg, 5 mg, Oral, BID, Levin Erp, MD, 5 mg at 09/06/23 8295   fenofibrate tablet 54 mg, 54 mg, Oral, Daily, Jena Gauss, Allee, MD, 54 mg at 09/06/23 0815   ipratropium-albuterol (DUONEB) 0.5-2.5 (3) MG/3ML nebulizer solution 3 mL, 3 mL, Nebulization, Q6H PRN, Jena Gauss, Allee, MD   isosorbide mononitrate (IMDUR) 24 hr tablet 30 mg, 30 mg, Oral, Daily, Maxwell, Allee, MD, 30 mg at 09/06/23 6213   levETIRAcetam (KEPPRA) tablet 500 mg, 500 mg, Oral, BID, Hyacinth Meeker, Samantha, DO   LORazepam (ATIVAN) injection 0.5 mg, 0.5 mg, Intravenous, Once PRN, Everhart, Kirstie, DO   nicotine (NICODERM CQ - dosed in mg/24 hours) patch 21 mg, 21 mg, Transdermal, Daily, Everhart, Kirstie, DO, 21 mg at 09/06/23 0865   simvastatin (ZOCOR) tablet 10 mg, 10 mg, Oral, Daily, Maxwell, Allee, MD, 10 mg at 09/06/23 7846  Facility-Administered Medications Ordered in Other Encounters:    regadenoson (LEXISCAN) injection SOLN 0.4 mg, 0.4 mg, Intravenous, Once, Chilton Si, MD   technetium tetrofosmin (TC-MYOVIEW) injection 31.9 millicurie, 31.9 millicurie, Intravenous, Once PRN, Chilton Si, MD  Labs and Diagnostic Imaging   CBC:  Recent Labs  Lab 09/03/23 1315 09/04/23 0402 09/05/23 1914  WBC 13.5* 12.1* 13.2*  NEUTROABS 10.3*  --   --   HGB 13.4 13.0 12.0*  HCT 39.8 39.5 35.2*  MCV 92.8 93.8 91.7  PLT 246 197 202    Basic Metabolic Panel:  Lab Results  Component Value Date   NA 138 09/05/2023   K 4.3 09/05/2023   CO2 22 09/05/2023   GLUCOSE 101 (H) 09/05/2023   BUN 28 (H) 09/05/2023   CREATININE 1.19 09/05/2023   CALCIUM 8.7 (L) 09/05/2023   GFRNONAA 59 (L) 09/05/2023   GFRAA 61 (L)  11/15/2014   Lipid Panel: No results found for: "LDLCALC" HgbA1c:  Lab Results  Component Value Date   HGBA1C 5.0 09/05/2023   Urine Drug Screen: No results found for: "LABOPIA", "COCAINSCRNUR", "LABBENZ", "AMPHETMU", "THCU", "LABBARB"  Alcohol Level No results found for: "ETH" INR  Lab Results  Component Value Date   INR 3.4 05/28/2011   APTT  Lab Results  Component Value Date   APTT 36 06/30/2010   Cryptococcal antigen negative Toxoplasma IgG and IgM less than 3 HIV negative QuantiFERON TB pending  CT Head without contrast(Personally reviewed): Extensive vasogenic edema in both posterior cerebral hemispheres with evidence of underlying masses  MRI Brain(Personally reviewed): Peripherally enhancing masses in bilateral posterior temporal and occipital lobes with significant surrounding edema, 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 is a 89 y.o. male with history of diabetes, A-fib on Eliquis, COPD, hyperlipidemia, hypertension, heart block status post pacemaker and remote surgery on the right arm due to an old injury who presented after multiple falls.  MRI brain reveals bilateral peripherally enhancing masses in temporal and occipital lobes.  These lesions are favored to represent metastases, however no primary neoplasm has been found.  It is also possible that these lesions represent an infectious process, and patient is afebrile but does have a mild leukocytosis.  He has been seen by oncology and is pending infectious disease evaluation.  EEG reveals epileptogenicity but no seizures.  Patient's mental status has  improved since yesterday, but he remains disoriented to current situation.  Given age, mental status and comorbidities, would agree with oncology that he will not be a candidate for aggressive measures.  Recommendations  -Infectious disease consult for further workup of masses - Follow-up on QuantiFERON - Consider LP if this is in line with wishes of patient and family ______________________________________________________________________   Signed, Marjorie Smolder, NP [facility]    Attending Neurohospitalist Addendum Patient seen and examined with APP/Resident. Agree with the history and physical as documented above. Agree with the plan as documented, which I helped formulate. I have edited the note above to reflect my full findings and recommendations. I have independently reviewed the chart, obtained history, review of systems and examined the patient.I have personally reviewed pertinent head/neck/spine imaging (CT/MRI). Please feel free to call with any questions.  -- Bing Neighbors, MD [facility] [phone]  If 7pm- 7am, please page neurology on call as listed in AMION.

## 2023-09-06 NOTE — Assessment & Plan Note (Deleted)
 Left sixth rib fracture. Symptomatic management.  - Incentive spirometry and pulm toiletry to prevent atelectasis

## 2023-09-06 NOTE — Assessment & Plan Note (Addendum)
Continuing to follow-up on neuro workup to differentiate between suspected neoplasm versus possible infectious sources.  Thus far workup has been negative.  EEG was completed yesterday with abnormalities suggestive of structural cause, continue with recommendations as below. We are also working him up for reversible causes of dementia based on this new finding of mass.  -Neuro recommendations (suspect neoplasm) - quantiferon,Toxoplasma IgM and IgG still pending -RPR also added on and pending -Cryptococcus and HIV negative - Oncology consulted - Palliative care consulted - Cardiac Tele - Interrogate pacer  - Fall precautions  - regular diet

## 2023-09-06 NOTE — Plan of Care (Signed)

## 2023-09-06 NOTE — Assessment & Plan Note (Addendum)
Most likely due to brain masses discussed elsewhere.  Will continue to monitor for fall precautions and provide appropriate care. -PT OT treatment

## 2023-09-06 NOTE — Assessment & Plan Note (Deleted)
"  Bilateral L4 and L5 pars interarticularis defects with grade 1 anterolisthesis of L5 on S1." Questionable of this contribution to falls. Not a good surgical candidate.  No pain at this time -Will continue to monitor for back pain or new weakness

## 2023-09-06 NOTE — Progress Notes (Addendum)
Gerald Hardin.   DOB:Aug 09, 1933   MW#:[id number]   YQI#:[id number]  Assessment/Plan  This a very pleasant 89 year old male patient with history of squamous cell carcinoma, atrial fibrillation on anticoagulation presented with frequent falls.  He had brain imaging which showed peripherally enhancing masses in bilateral posterior temporal, occipital lobes with significant edema, neoplasm versus infection as the primary differential.  He had systemic staging which showed bilateral lung nodules largest is in the right lung and measuring 12 mm.  No other concerns noted on the systemic staging.  I have talked to the patient and his wife, he never had a history of melanoma.  He had many questions today.  We have discussed that there is not a clear way to confirm if this is malignancy versus infection although malignancy was higher on the differential.  But at this time there appears to be no definitive primary.  Given his age, and his questionable memory and decision making capacity, if this was indeed metastatic malignancy, I didn't recommend any aggressive procedures or interventions. I dont believe he will be a candidate for any aggressive chemotherapy.  Wife would like to think about our recommendations. Currently she is leaning towards considering spinal tap. He on the other hand wants the resume of anyone who will operate on him and printed information about the surgery so he can study, although I am not sure he follows everything we talked.  Please consult cardiology about holding Eliquis If indeed they would like to proceed with spinal tap. I discussed increased risk of cardiovascular events while he is off of anticoagulation.  He wants all the recommendations to be run by his cardiologist Dr Clide Cliff. He said he trusts this doctor very much.  Please re consult oncology with any further questions. If there any path suggesting carcinoma, please call us so we can see him again.  Subjective: Pt resting by  himself. No family at bedside.  Objective:  Vitals:   09/06/23 0359 09/06/23 0826  BP: 133/60 (!) 127/58  Pulse: 72 70  Resp: 17 16  Temp: (!) 97.5 F (36.4 C)   SpO2: 92% 100%    Body mass index is 31.38 kg/m.  Intake/Output Summary (Last 24 hours) at 09/06/2023 1159 Last data filed at 09/06/2023 1001 Gross per 24 hour  Intake 360 ml  Output 350 ml  Net 10 ml   He is resting, no acute distress.  He had several excellent questions today for me.  CBG (last 3)  No results for input(s): "GLUCAP" in the last 72 hours.   Labs:  Lab Results  Component Value Date   WBC 13.2 (H) 09/05/2023   HGB 12.0 (L) 09/05/2023   HCT 35.2 (L) 09/05/2023   MCV 91.7 09/05/2023   PLT 202 09/05/2023   NEUTROABS 10.3 (H) 09/03/2023    @LASTCHEMISTRY @  Urine Studies No results for input(s): "UHGB", "CRYS" in the last 72 hours.  Invalid input(s): "UACOL", "UAPR", "USPG", "UPH", "UTP", "UGL", "UKET", "UBIL", "UNIT", "UROB", "ULEU", "UEPI", "UWBC", "URBC", "UBAC", "CAST", "UCOM", "BILUA"  Basic Metabolic Panel: Recent Labs  Lab 09/03/23 1350 09/04/23 0402 09/05/23 1914  NA 135 137 138  K 4.2 4.0 4.3  CL 105 104 107  CO2 19* 20* 22  GLUCOSE 161* 132* 101*  BUN 14 12 28*  CREATININE 1.26* 1.02 1.19  CALCIUM 8.6* 8.7* 8.7*  MG  --  1.9  --    GFR Estimated Creatinine Clearance: 53.2 mL/min (by C-G formula based on SCr of 1.19 mg/dL).  Liver Function Tests: Recent Labs  Lab 09/03/23 1350  AST 34  ALT 20  ALKPHOS 39  BILITOT 0.9  PROT 5.9*  ALBUMIN 3.3*   No results for input(s): "LIPASE", "AMYLASE" in the last 168 hours. No results for input(s): "AMMONIA" in the last 168 hours. Coagulation profile No results for input(s): "INR", "PROTIME" in the last 168 hours.  CBC: Recent Labs  Lab 09/03/23 1315 09/04/23 0402 09/21/23 1914  WBC 13.5* 12.1* 13.2*  NEUTROABS 10.3*  --   --   HGB 13.4 13.0 12.0*  HCT 39.8 39.5 35.2*  MCV 92.8 93.8 91.7  PLT 246 197 202   Cardiac  Enzymes: Recent Labs  Lab 09/04/23 0402  CKTOTAL 30*   BNP: Invalid input(s): "POCBNP" CBG: No results for input(s): "GLUCAP" in the last 168 hours. D-Dimer No results for input(s): "DDIMER" in the last 72 hours. Hgb A1c Recent Labs    Sep 21, 2023 1914  HGBA1C 5.0   Lipid Profile No results for input(s): "CHOL", "HDL", "LDLCALC", "TRIG", "CHOLHDL", "LDLDIRECT" in the last 72 hours. Thyroid function studies Recent Labs    09/06/23 0747  TSH 1.454   Anemia work up Recent Labs    09/04/23 0402  VITAMINB12 776   Microbiology No results found for this or any previous visit (from the past 240 hours).    Studies:  EEG adult Result Date: Sep 21, 2023 Gerald Quest, MD     09/21/23  2:37 PM Patient Name: Gerald Hardin. MRN: [id number] Epilepsy Attending: Charlsie Hardin Referring Physician/Provider: Erick Blinks, MD Date: 09/21/2023 Duration: 27.34 mins Patient history: 89yo F with fall, CTH with edema concerning for tumor. EEG to evaluate for seizure Level of alertness: Awake, asleep AEDs during EEG study: None Technical aspects: This EEG study was done with scalp electrodes positioned according to the 10-20 International system of electrode placement. Electrical activity was reviewed with band pass filter of 1-70Hz , sensitivity of 7 uV/mm, display speed of 67mm/sec with a 60Hz  notched filter applied as appropriate. EEG data were recorded continuously and digitally stored.  Video monitoring was available and reviewed as appropriate. Description: The posterior dominant rhythm consists of 8 Hz activity of moderate voltage (25-35 uV) seen predominantly in posterior head regions, symmetric and reactive to eye opening and eye closing. Sleep was characterized by vertex waves, sleep spindles (12 to 14 Hz), maximal frontocentral region. EEG showed continuous 5 to 6 Hz theta slowing in bilateral posterior quadrant. Sharp waves were noted in left occipital region. Hyperventilation and  photic stimulation were not performed.   ABNORMALITY - Sharp wave, left occipital region - Continuous slow, bilateral posterior quadrant IMPRESSION: This study showed evidence of epileptogenicity arising from left occipital region. Additionally there is cortical dysfunction arising from bilateral posterior quadrant likely secondary to underlying structural abnormality. No seizures were seen throughout the recording. Gerald Hardin   ECHOCARDIOGRAM COMPLETE Result Date: September 21, 2023    ECHOCARDIOGRAM REPORT   Patient Name:   Gerald Hardin. Date of Exam: Sep 21, 2023 Medical Rec #:  [id number]         Height:       71.0 in Accession #:    [id number]        Weight:       225.0 lb Date of Birth:  29-Aug-1933         BSA:          2.217 m Patient Age:    74 years  BP:           144/79 mmHg Patient Gender: M                 HR:           70 bpm. Exam Location:  Inpatient Procedure: 2D Echo, Cardiac Doppler, Color Doppler and Intracardiac            Opacification Agent Indications:    Atrial Fibrillation  History:        Patient has no prior history of Echocardiogram examinations.                 Arrythmias:Atrial Fibrillation, Signs/Symptoms:Syncope; Risk                 Factors:Hypertension.  Sonographer:    Webb Laws Referring Phys: Gerald Hardin IMPRESSIONS  1. Technically difficult study with poor visualization of cardiac structures.  2. Left ventricular ejection fraction, by estimation, is 60 to 65%. The left ventricle has normal function. The left ventricle has no regional wall motion abnormalities. Left ventricular diastolic function could not be evaluated.  3. Right ventricular systolic function is normal. The right ventricular size is mildly enlarged.  4. The mitral valve is normal in structure. No evidence of mitral valve regurgitation. No evidence of mitral stenosis.  5. Tricuspid valve regurgitation is moderate.  6. The aortic valve is normal in structure. Aortic valve regurgitation is not  visualized. No aortic stenosis is present. FINDINGS  Left Ventricle: Left ventricular ejection fraction, by estimation, is 60 to 65%. The left ventricle has normal function. The left ventricle has no regional wall motion abnormalities. Definity contrast agent was given IV to delineate the left ventricular  endocardial borders. The left ventricular internal cavity size was normal in size. There is no left ventricular hypertrophy. Left ventricular diastolic function could not be evaluated due to nondiagnostic images. Left ventricular diastolic function could not be evaluated. Right Ventricle: The right ventricular size is mildly enlarged. No increase in right ventricular wall thickness. Right ventricular systolic function is normal. Left Atrium: Left atrial size was normal in size. Right Atrium: Right atrial size was normal in size. Pericardium: There is no evidence of pericardial effusion. Mitral Valve: The mitral valve is normal in structure. Mild mitral annular calcification. No evidence of mitral valve regurgitation. No evidence of mitral valve stenosis. Tricuspid Valve: The tricuspid valve is normal in structure. Tricuspid valve regurgitation is moderate . No evidence of tricuspid stenosis. Aortic Valve: The aortic valve is normal in structure. Aortic valve regurgitation is not visualized. No aortic stenosis is present. Pulmonic Valve: The pulmonic valve was not well visualized. Pulmonic valve regurgitation is not visualized. No evidence of pulmonic stenosis. Aorta: The aortic root is normal in size and structure. IAS/Shunts: No atrial level shunt detected by color flow Doppler.  LEFT VENTRICLE PLAX 2D LVIDd:         5.20 cm     Diastology LVIDs:         3.70 cm     LV e' medial:    5.77 cm/s LV PW:         1.00 cm     LV E/e' medial:  12.8 LV IVS:        0.90 cm     LV e' lateral:   3.97 cm/s                            LV E/e'  lateral: 18.6  LV Volumes (MOD) LV vol d, MOD A4C: 67.0 ml LV vol s, MOD A4C: 27.9 ml  LV SV MOD A4C:     67.0 ml RIGHT VENTRICLE RV S prime:     6.67 cm/s LEFT ATRIUM         Index       RIGHT ATRIUM           Index LA diam:    4.10 cm 1.85 cm/m  RA Area:     18.00 cm                                 RA Volume:   52.30 ml  23.59 ml/m   AORTA Ao Root diam: 2.80 cm MITRAL VALVE               TRICUSPID VALVE MV Area (PHT): 2.20 cm    TR Peak grad:   26.0 mmHg MV Decel Time: 345 msec    TR Vmax:        255.00 cm/s MV E velocity: 73.90 cm/s Gerald Hardin Electronically signed by Dorthula Nettles Signature Date/Time: 09/05/2023/10:51:17 AM    Final    MR Brain W and Wo Contrast Result Date: 09/04/2023 CLINICAL DATA:  Masses seen on CT head EXAM: MRI HEAD WITHOUT AND WITH CONTRAST TECHNIQUE: Multiplanar, multiecho pulse sequences of the brain and surrounding structures were obtained without and with intravenous contrast. CONTRAST:  10mL GADAVIST GADOBUTROL 1 MMOL/ML IV SOLN COMPARISON:  No prior MRI available, correlation is made with 09/03/2023 CT head FINDINGS: Brain: Peripherally enhancing masses in the bilateral posterior temporal and occipital lobes, which demonstrate mild peripheral enhancement, peripheral restricted diffusion, and central T2 hyperintensity. Some hemosiderin deposition is noted within the walls. Significant surrounding T2 hyperintense signal, consistent with edema. Mass effect on the atria and occipital horns of the lateral ventricles, without evidence of entrapment hydrocephalus. No acute hemorrhage or midline shift. No extra-axial collection. Pituitary and craniocervical junction within normal limits. Vascular: Normal arterial flow voids. Normal arterial and venous enhancement. Skull and upper cervical spine: Normal marrow signal. Sinuses/Orbits: Clear paranasal sinuses. No acute finding in the orbits. Status post bilateral lens replacements. Other: Fluid in bilateral mastoid air cells. IMPRESSION: Peripherally enhancing masses in the bilateral posterior temporal and  occipital lobes, with significant surrounding edema, 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 Electronically Signed   By: Wiliam Ke M.D.   On: 09/04/2023 17:10    Gerald Moulds, MD 09/06/2023  11:59 AM

## 2023-09-06 NOTE — Assessment & Plan Note (Deleted)
 Chronic limitation to right hand and upper extremity with history of squamous cell CA and prior XRT/surgery. Acute on chronic right shoulder pain. CT shows degenerative changes of b/l shoulders, may require dedicated imaging. No acute deformities or abnormalities. Not complaining of shoulder pain this morning - will continue to monitor

## 2023-09-06 NOTE — Assessment & Plan Note (Signed)
 Dermabond from EDP.

## 2023-09-06 NOTE — Assessment & Plan Note (Deleted)
 Multiple vitamins & OTC medications used at home: B-Complex, Glucosamine & Chondroitin complex, CoQ10, Lutein, Niacin, Zeaxanthin, Vit A PO, Vit D3. Holding most for now, patient very particular about his medications. Vitamin D level is low - Will need med rec outpatient

## 2023-09-07 DIAGNOSIS — Z7189 Other specified counseling: Secondary | ICD-10-CM

## 2023-09-07 DIAGNOSIS — Z515 Encounter for palliative care: Secondary | ICD-10-CM | POA: Diagnosis not present

## 2023-09-07 DIAGNOSIS — G9389 Other specified disorders of brain: Secondary | ICD-10-CM | POA: Diagnosis not present

## 2023-09-07 DIAGNOSIS — R937 Abnormal findings on diagnostic imaging of other parts of musculoskeletal system: Secondary | ICD-10-CM

## 2023-09-07 DIAGNOSIS — W19XXXA Unspecified fall, initial encounter: Secondary | ICD-10-CM | POA: Diagnosis not present

## 2023-09-07 DIAGNOSIS — M25511 Pain in right shoulder: Secondary | ICD-10-CM

## 2023-09-07 LAB — GLUCOSE, CAPILLARY: Glucose-Capillary: 123 mg/dL — ABNORMAL HIGH (ref 70–99)

## 2023-09-07 LAB — CBC
HCT: 37.8 % — ABNORMAL LOW (ref 39.0–52.0)
Hemoglobin: 13 g/dL (ref 13.0–17.0)
MCH: 31 pg (ref 26.0–34.0)
MCHC: 34.4 g/dL (ref 30.0–36.0)
MCV: 90.2 fL (ref 80.0–100.0)
Platelets: 227 10*3/uL (ref 150–400)
RBC: 4.19 MIL/uL — ABNORMAL LOW (ref 4.22–5.81)
RDW: 13.5 % (ref 11.5–15.5)
WBC: 12.9 10*3/uL — ABNORMAL HIGH (ref 4.0–10.5)
nRBC: 0 % (ref 0.0–0.2)

## 2023-09-07 NOTE — Assessment & Plan Note (Addendum)
 Multiple vitamins & OTC medications used at home: B-Complex, Glucosamine & Chondroitin complex, CoQ10, Lutein, Niacin, Zeaxanthin, Vit A PO, Vit D3. Holding most for now, patient very particular about his medications. Vitamin D level is low - Will need med rec outpatient

## 2023-09-07 NOTE — Assessment & Plan Note (Addendum)
 Left sixth rib fracture. Symptomatic management.  - Incentive spirometry and pulm toiletry to prevent atelectasis

## 2023-09-07 NOTE — Assessment & Plan Note (Addendum)
 Dermabond from EDP.

## 2023-09-07 NOTE — Assessment & Plan Note (Addendum)
Continuing to follow-up on neuro workup to differentiate between suspected neoplasm versus possible infectious sources -- thus far negative. EEG with abnormalities suggestive of structural cause. RPR, HIV, Toxoplasma, cryptococcus negative. Awaiting QuantTB.  Holding Eliquis x 3 days for LP. -Neuro recommendations (suspect neoplasm), appreciate recommendations - F/u quantiferon; plan to obtain LP - Oncology following, appreciate recommendations - Palliative care consulted - Interrogate pacer  - Fall precautions

## 2023-09-07 NOTE — Assessment & Plan Note (Addendum)
 CT pelvis: No acute intrathoracic, intra-abdominal, intrapelvic traumatic injury. Painful on examination without overt deformities or abnormalities. Limited ROM. - PT/OT

## 2023-09-07 NOTE — Consult Note (Signed)
[facility]                                                                                        Patient Identification: Patient Name: Gerald Hardin. MRN: [id number] Admit Date: 09/03/2023 12:59 PM Today's Date: 09/07/2023 Reason for consult: abnormal MRI brain Requesting provider: Dr Manson Passey  Principal Problem:   Bilateral Temporal-Occipital Brain Lesions Active Problems:   Fall   Superficial abrasion   Right hip pain   Right shoulder pain   Laceration of head   Abnormal brain CT   Medication management   Rib fracture   Abnormal CT scan, lumbar spine   Pulmonary nodules   Antibiotics:  None   Lines/Hardware:  Assessment # MR with peripherally enhancing masses in the bilateral posterior temporal and occipital lobes, with significant surrounding edema - Discussed with radiologist Dr Sebastian Ache, necrotic neoplasms high in the DD, unlikely infectious cause including abscesses. D/w Neurology Dr Jill Side, highly likely malignancy.  - 1/31 HIV NR, Toxoplasma IgM and IgG NR, cryptococcal ag negative - 2/1 RPR NR - No risk factors for TB. No known immunocompromising conditions for suspectibg OIS. Other DD considered CMV encephalitis, neurocysticercosis, bacterial meningitis/TB meningitis, brain abscess, seems less likely  # Multiple pulmonary nodules  # Active smoker  # Mild leukocytosis - This is likely a reactive process, he has remained afebrile PTA as well so far this admission  Recommendations  - Hold off abtx given unlikely infective source. However Ok to start abtx if any signs of sepsis.  - 2 sets of blood cx ordered   - Defer further malignancy work up to Neurology and Oncology pending GOC discussion with family  - An LP would be the most less invasive diagnostic work up to consider compared to brain biopsy but given his brain mass/possibly elevated ICP, it may not be safer  to pursue and will defer to Neurology for next steps - ? biopsy of pulmonary nodule given pulmonary nodules, h/o extensive smoking and possible primary source of malignancy - d/w primary team and neurology  Rest of the management as per the primary team. Please call with questions or concerns.  Thank you for the consult  __________________________________________________________________________________________________________ HPI and Hospital Course: 89 Y O male with h/o SCC RUE s/p excision, A fib on AC, CHB s/p PPM, CKD, COPD, HTN, HLD who presented to ED 1/29 with multiple falls.  At ED afebrile Labs remarkable for Cr elevated to 1.26, LA elevated to 2.4. WBC elevated to 13.5 Imaging as below   Grandson and wife at bedside. Reports patient lives with wife and grandson, grandson has moved from [place] approx a year ago. Denies fevers, chills and night sweats PTA. Reports chronic cough related to COPD but no chest pain or sob. Family reported not behaving as his normal self approx couple of weeks PTA with some confusion followed by multiple falls. No know GU symptom, rashes, headache. Reports poor vision due to macular degeneration and hard of hearing.   Born in [place], living between [place] and [place] since 2011. No recent travel or sick contacts but previously traveled to [place]  in 28s, [place] and [place]. Smokes 1 pack of cigarettes a day, denies alcohol and recreational drug use. No known h/o TB, family members with TB. No known incarceration, being homeless.   ROS: as above, but limited due to poor historian  Past Medical History:  Diagnosis Date   CA - cancer    Right Axillary Squamous Cell   Cataracts, bilateral    CHB (complete heart block) (HCC) nov 2011   CKD (chronic kidney disease), stage II    COPD (chronic obstructive pulmonary disease) (HCC)    Diabetes mellitus    DVT of upper extremity (deep vein thrombosis) (HCC)    post pacer   Hyperlipidemia    Hypertension     Macular degeneration    Pacemaker    st judes   Past Surgical History:  Procedure Laterality Date   PACEMAKER PLACEMENT  Jul 02 2010   [facility] GENERATOR CHANGEOUT N/A 02/23/2021   Procedure: PPM GENERATOR CHANGEOUT;  Surgeon: Duke Salvia, MD;  Location: [facility] INVASIVE CV LAB;  Service: Cardiovascular;  Laterality: N/A;   SQUAMOUS CELL CARCINOMA EXCISION     and XRT   xrt     Scheduled Meds:  fenofibrate  54 mg Oral Daily   isosorbide mononitrate  30 mg Oral Daily   levETIRAcetam  500 mg Oral BID   nicotine  21 mg Transdermal Daily   simvastatin  10 mg Oral Daily   Continuous Infusions: PRN Meds:.acetaminophen, ipratropium-albuterol, LORazepam  Allergies  Allergen Reactions   Other Other (See Comments)    Other reaction(s): Cough, Other (See Comments) Freshly sprayed hairspray causes coughing, sneezing, and watery eyes. HAIR SPRAY CAUSES SNEEZING, WATERY EYES AND COUGH   Do not eat Red Beef and Popcorn    Chicken Meat (Diagnostic) Other (See Comments)    Fried / Can eat on if cooked in non cholesterol oil   Egg-Derived Products Other (See Comments)    Yoke   Social History   Socioeconomic History   Marital status: Married    Spouse name: Not on file   Number of children: Not on file   Years of education: Not on file   Highest education level: Not on file  Occupational History   Not on file  Tobacco Use   Smoking status: Smoker, Current Status Unknown    Current packs/day: 1.00    Types: Cigarettes   Smokeless tobacco: Never  Substance and Sexual Activity   Alcohol use: No   Drug use: No   Sexual activity: Not on file  Other Topics Concern   Not on file  Social History Narrative   Not on file   Social Drivers of Health   Financial Resource Strain: Not on file  Food Insecurity: No Food Insecurity (09/04/2023)   Hunger Vital Sign    Worried About Running Out of Food in the Last Year: Never true    Ran Out of Food in the Last Year: Never true  Transportation  Needs: No Transportation Needs (09/04/2023)   PRAPARE - Administrator, Civil Service (Medical): No    Lack of Transportation (Non-Medical): No  Physical Activity: Not on file  Stress: Not on file  Social Connections: Unknown (09/04/2023)   Social Connection and Isolation Panel [NHANES]    Frequency of Communication with Friends and Family: More than three times a week    Frequency of Social Gatherings with Friends and Family: More than three times a week    Attends Religious Services: Patient declined  Active Member of Clubs or Organizations: Patient declined    Attends Banker Meetings: Patient declined    Marital Status: Married  Catering manager Violence: Unknown (09/04/2023)   Humiliation, Afraid, Rape, and Kick questionnaire    Fear of Current or Ex-Partner: No    Emotionally Abused: Patient declined    Physically Abused: Patient declined    Sexually Abused: Patient declined   Family History  Problem Relation Age of Onset   Heart failure Father    Vitals  BP (!) 118/95 (BP Location: Left Arm)   Pulse 70   Temp 97.9 F (36.6 C)   Resp 18   Ht 5\' 11"  (1.803 m)   Wt 102.1 kg   SpO2 98%   BMI 31.38 kg/m   Physical Exam Constitutional:  elderly male lying in the bed, not in acute distress     Comments: HEENT, oral mucosa moist  Cardiovascular:     Rate and Rhythm: Normal rate and regular rhythm.     Heart sounds: s1s2  Pulmonary:     Effort: Pulmonary effort is normal.     Comments: Normal breath sounds   Abdominal:     Palpations: Abdomen is soft.     Tenderness: non distended and non tender   Musculoskeletal:        General: No swelling or tenderness in peripheral joints   Skin:    Comments: no rashes, PPM site OK  Neurological:     General: awake, alert. He know he is in the hospital, recognizes his wife, grandson but intermittently gets confused, follows simple basic commands like sticking out tongue, open your mouth    Pertinent Microbiology Results for orders placed or performed during the hospital encounter of 06/30/10  MRSA PCR Screening     Status: None   Collection Time: 06/30/10 10:00 AM  Result Value Ref Range Status   MRSA by PCR  NEGATIVE Final    NEGATIVE        The GeneXpert MRSA Assay (FDA approved for NASAL specimens only), is one component of a comprehensive MRSA colonization surveillance program. It is not intended to diagnose MRSA infection nor to guide or monitor treatment for MRSA infections.  Culture, blood (routine x 2)     Status: None   Collection Time: 06/30/10 12:30 PM   Specimen: BLOOD  Result Value Ref Range Status   Specimen Description BLOOD FEMORAL ARTERY  Final   Special Requests   Final    BOTTLES DRAWN AEROBIC AND ANAEROBIC 10CC BLUE 8CC RED   Culture  Setup Time [id number]  Final   Culture NO GROWTH 5 DAYS  Final   Report Status 07/06/2010 FINAL  Final  Culture, blood (routine x 2)     Status: None   Collection Time: 06/30/10 12:40 PM   Specimen: BLOOD  Result Value Ref Range Status   Specimen Description BLOOD ARM LEFT  Final   Special Requests BOTTLES DRAWN AEROBIC ONLY 3CC ONLY  Final   Culture  Setup Time [id number]  Final   Culture NO GROWTH 5 DAYS  Final   Report Status 07/06/2010 FINAL  Final  Urine culture     Status: None   Collection Time: 07/01/10  6:02 PM   Specimen: Urine, Clean Catch  Result Value Ref Range Status   Specimen Description URINE, CLEAN CATCH  Final   Special Requests NONE  Final   Culture  Setup Time [id number]  Final   Colony Count NO GROWTH  Final  Culture NO GROWTH  Final   Report Status 07/03/2010 FINAL  Final   Pertinent Lab seen by me:    Latest Ref Rng & Units 09/07/2023    8:26 AM 09/05/2023    7:14 PM 09/04/2023    4:02 AM  CBC  WBC 4.0 - 10.5 K/uL 12.9  13.2  12.1   Hemoglobin 13.0 - 17.0 g/dL 87.5  64.3  32.9   Hematocrit 39.0 - 52.0 % 37.8  35.2  39.5   Platelets 150 - 400 K/uL 227  202  197        Latest Ref Rng & Units 09/05/2023    7:14 PM 09/04/2023    4:02 AM 09/03/2023    1:50 PM  CMP  Glucose 70 - 99 mg/dL 518  841  660   BUN 8 - 23 mg/dL 28  12  14    Creatinine 0.61 - 1.24 mg/dL 6.30  1.60  1.09   Sodium 135 - 145 mmol/L 138  137  135   Potassium 3.5 - 5.1 mmol/L 4.3  4.0  4.2   Chloride 98 - 111 mmol/L 107  104  105   CO2 22 - 32 mmol/L 22  20  19    Calcium 8.9 - 10.3 mg/dL 8.7  8.7  8.6   Total Protein 6.5 - 8.1 g/dL   5.9   Total Bilirubin 0.0 - 1.2 mg/dL   0.9   Alkaline Phos 38 - 126 U/L   39   AST 15 - 41 U/L   34   ALT 0 - 44 U/L   20     Pertinent Imagings/Other Imagings Plain films and CT images have been personally visualized and interpreted; radiology reports have been reviewed. Decision making incorporated into the Impression / Recommendations.  EEG adult Result Date: 09/05/2023 Charlsie Quest, MD     09/05/2023  2:37 PM Patient Name: Vallen Calabrese. MRN: [id number] Epilepsy Attending: Charlsie Quest Referring Physician/Provider: Erick Blinks, MD Date: 09/05/2023 Duration: 27.34 mins Patient history: 89yo F with fall, CTH with edema concerning for tumor. EEG to evaluate for seizure Level of alertness: Awake, asleep AEDs during EEG study: None Technical aspects: This EEG study was done with scalp electrodes positioned according to the 10-20 International system of electrode placement. Electrical activity was reviewed with band pass filter of 1-70Hz , sensitivity of 7 uV/mm, display speed of 33mm/sec with a 60Hz  notched filter applied as appropriate. EEG data were recorded continuously and digitally stored.  Video monitoring was available and reviewed as appropriate. Description: The posterior dominant rhythm consists of 8 Hz activity of moderate voltage (25-35 uV) seen predominantly in posterior head regions, symmetric and reactive to eye opening and eye closing. Sleep was characterized by vertex waves, sleep spindles (12 to 14 Hz), maximal frontocentral  region. EEG showed continuous 5 to 6 Hz theta slowing in bilateral posterior quadrant. Sharp waves were noted in left occipital region. Hyperventilation and photic stimulation were not performed.   ABNORMALITY - Sharp wave, left occipital region - Continuous slow, bilateral posterior quadrant IMPRESSION: This study showed evidence of epileptogenicity arising from left occipital region. Additionally there is cortical dysfunction arising from bilateral posterior quadrant likely secondary to underlying structural abnormality. No seizures were seen throughout the recording. Charlsie Quest   ECHOCARDIOGRAM COMPLETE Result Date: 09/05/2023    ECHOCARDIOGRAM REPORT   Patient Name:   Maher Shon. Date of Exam: 09/05/2023 Medical Rec #:  [id number]  Height:       71.0 in Accession #:    [id number]        Weight:       225.0 lb Date of Birth:  10-19-1933         BSA:          2.217 m Patient Age:    89 years          BP:           144/79 mmHg Patient Gender: M                 HR:           70 bpm. Exam Location:  Inpatient Procedure: 2D Echo, Cardiac Doppler, Color Doppler and Intracardiac            Opacification Agent Indications:    Atrial Fibrillation  History:        Patient has no prior history of Echocardiogram examinations.                 Arrythmias:Atrial Fibrillation, Signs/Symptoms:Syncope; Risk                 Factors:Hypertension.  Sonographer:    Webb Laws Referring Phys: Alfredo Martinez IMPRESSIONS  1. Technically difficult study with poor visualization of cardiac structures.  2. Left ventricular ejection fraction, by estimation, is 60 to 65%. The left ventricle has normal function. The left ventricle has no regional wall motion abnormalities. Left ventricular diastolic function could not be evaluated.  3. Right ventricular systolic function is normal. The right ventricular size is mildly enlarged.  4. The mitral valve is normal in structure. No evidence of mitral valve regurgitation. No  evidence of mitral stenosis.  5. Tricuspid valve regurgitation is moderate.  6. The aortic valve is normal in structure. Aortic valve regurgitation is not visualized. No aortic stenosis is present. FINDINGS  Left Ventricle: Left ventricular ejection fraction, by estimation, is 60 to 65%. The left ventricle has normal function. The left ventricle has no regional wall motion abnormalities. Definity contrast agent was given IV to delineate the left ventricular  endocardial borders. The left ventricular internal cavity size was normal in size. There is no left ventricular hypertrophy. Left ventricular diastolic function could not be evaluated due to nondiagnostic images. Left ventricular diastolic function could not be evaluated. Right Ventricle: The right ventricular size is mildly enlarged. No increase in right ventricular wall thickness. Right ventricular systolic function is normal. Left Atrium: Left atrial size was normal in size. Right Atrium: Right atrial size was normal in size. Pericardium: There is no evidence of pericardial effusion. Mitral Valve: The mitral valve is normal in structure. Mild mitral annular calcification. No evidence of mitral valve regurgitation. No evidence of mitral valve stenosis. Tricuspid Valve: The tricuspid valve is normal in structure. Tricuspid valve regurgitation is moderate . No evidence of tricuspid stenosis. Aortic Valve: The aortic valve is normal in structure. Aortic valve regurgitation is not visualized. No aortic stenosis is present. Pulmonic Valve: The pulmonic valve was not well visualized. Pulmonic valve regurgitation is not visualized. No evidence of pulmonic stenosis. Aorta: The aortic root is normal in size and structure. IAS/Shunts: No atrial level shunt detected by color flow Doppler.  LEFT VENTRICLE PLAX 2D LVIDd:         5.20 cm     Diastology LVIDs:         3.70 cm     LV e' medial:    5.77 cm/s  LV PW:         1.00 cm     LV E/e' medial:  12.8 LV IVS:        0.90  cm     LV e' lateral:   3.97 cm/s                            LV E/e' lateral: 18.6  LV Volumes (MOD) LV vol d, MOD A4C: 67.0 ml LV vol s, MOD A4C: 27.9 ml LV SV MOD A4C:     67.0 ml RIGHT VENTRICLE RV S prime:     6.67 cm/s LEFT ATRIUM         Index       RIGHT ATRIUM           Index LA diam:    4.10 cm 1.85 cm/m  RA Area:     18.00 cm                                 RA Volume:   52.30 ml  23.59 ml/m   AORTA Ao Root diam: 2.80 cm MITRAL VALVE               TRICUSPID VALVE MV Area (PHT): 2.20 cm    TR Peak grad:   26.0 mmHg MV Decel Time: 345 msec    TR Vmax:        255.00 cm/s MV E velocity: 73.90 cm/s Aditya Sabharwal Electronically signed by Dorthula Nettles Signature Date/Time: 09/05/2023/10:51:17 AM    Final    MR Brain W and Wo Contrast Result Date: 09/04/2023 CLINICAL DATA:  Masses seen on CT head EXAM: MRI HEAD WITHOUT AND WITH CONTRAST TECHNIQUE: Multiplanar, multiecho pulse sequences of the brain and surrounding structures were obtained without and with intravenous contrast. CONTRAST:  10mL GADAVIST GADOBUTROL 1 MMOL/ML IV SOLN COMPARISON:  No prior MRI available, correlation is made with 09/03/2023 CT head FINDINGS: Brain: Peripherally enhancing masses in the bilateral posterior temporal and occipital lobes, which demonstrate mild peripheral enhancement, peripheral restricted diffusion, and central T2 hyperintensity. Some hemosiderin deposition is noted within the walls. Significant surrounding T2 hyperintense signal, consistent with edema. Mass effect on the atria and occipital horns of the lateral ventricles, without evidence of entrapment hydrocephalus. No acute hemorrhage or midline shift. No extra-axial collection. Pituitary and craniocervical junction within normal limits. Vascular: Normal arterial flow voids. Normal arterial and venous enhancement. Skull and upper cervical spine: Normal marrow signal. Sinuses/Orbits: Clear paranasal sinuses. No acute finding in the orbits. Status post  bilateral lens replacements. Other: Fluid in bilateral mastoid air cells. IMPRESSION: Peripherally enhancing masses in the bilateral posterior temporal and occipital lobes, with significant surrounding edema, 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 Electronically Signed   By: Wiliam Ke M.D.   On: 09/04/2023 17:10   CT L-SPINE NO CHARGE Result Date: 09/03/2023 CLINICAL DATA:  [id number] Pain [id number] EXAM: CT LUMBAR SPINE WITHOUT CONTRAST TECHNIQUE: Multidetector CT imaging of the lumbar spine was performed without intravenous contrast administration. Multiplanar CT image reconstructions were also generated. RADIATION DOSE REDUCTION: This exam was performed according to the departmental dose-optimization program which includes automated exposure control, adjustment of the mA and/or kV according to patient size and/or use of iterative reconstruction technique. COMPARISON:  None Available. FINDINGS: Segmentation: 5 non-rib-bearing lumbar vertebral bodies. Alignment: Dextroscoliosis  of the thoracolumbar spine. Grade 1 anterolisthesis of L5 on S1 with associated bilateral L5 pars interarticularis defects. Bilateral L4 pars interarticularis defects. Interarticularis defects. Mild retrolisthesis of L3 on L4. Vertebrae: Severe degenerative changes of the lumbar spine. L3-L4 endplate sclerosis. Partial fusion of the L2-L3 levels. Old healed right L2 transverse process fracture. No acute fracture or focal pathologic process. Paraspinal and other soft tissues: Negative. Disc levels: Multilevel intervertebral disc space vacuum phenomenon. IMPRESSION: 1. No acute displaced fracture or traumatic listhesis of the lumbar spine. 2. Bilateral L4 and L5 pars interarticularis defects with grade 1 anterolisthesis of L5 on S1. 3. Dextroscoliosis of the thoracolumbar spine. 4. Severe degenerative changes of the spine. Electronically Signed   By:  Tish Frederickson M.D.   On: 09/03/2023 19:34   CT CHEST ABDOMEN PELVIS W CONTRAST Result Date: 09/03/2023 CLINICAL DATA:  Polytrauma, blunt t is on eliquis. Pt has left laceration to head and bruising around left eye and abrasions to left hand. VSS. Axox4. EXAM: CT CHEST, ABDOMEN, AND PELVIS WITH CONTRAST TECHNIQUE: Multidetector CT imaging of the chest, abdomen and pelvis was performed following the standard protocol during bolus administration of intravenous contrast. RADIATION DOSE REDUCTION: This exam was performed according to the departmental dose-optimization program which includes automated exposure control, adjustment of the mA and/or kV according to patient size and/or use of iterative reconstruction technique. CONTRAST:  75mL OMNIPAQUE IOHEXOL 350 MG/ML SOLN COMPARISON:  None Available. FINDINGS: CHEST: Cardiovascular: No aortic injury. The thoracic aorta is normal in caliber. The heart is normal in size. No significant pericardial effusion. Severe atherosclerotic plaque. Aortic valve leaflet calcification. Four-vessel coronary calcification. Mitral annular calcification. Mediastinum/Nodes: No pneumomediastinum. No mediastinal hematoma. The esophagus is unremarkable. The thyroid is unremarkable. The central airways are patent. No mediastinal, hilar, or axillary lymphadenopathy. Lungs/Pleura: Moderate emphysematous changes. Diffuse bronchial wall thickening. A 0.4 cm and 0.8 x 0.6 cm subpleural left lower lobe nodule (134, 5:150). A 1.2 x 0.8 cm subpleural pulmonary nodule within the right lobe along the major fissure. Couple right lower lobe subpleural nodules: 1 x 0.9 cm and 1.2 x 0.8 cm (5:116, 117). No focal consolidation. No pulmonary nodule. No pulmonary mass. No pulmonary contusion or laceration. No pneumatocele formation. No pleural effusion. No pneumothorax. No hemothorax. Musculoskeletal/Chest wall: No chest wall mass. Acute left sixth rib fracture. No sternal fracture. No spinal fracture.  Degenerative changes of bilateral shoulders. Degenerative changes of the right hand carpal bones. ABDOMEN / PELVIS: Hepatobiliary: Not enlarged. No focal lesion. No laceration or subcapsular hematoma. The gallbladder is otherwise unremarkable with no radio-opaque gallstones. No biliary ductal dilatation. Pancreas: Normal pancreatic contour. No main pancreatic duct dilatation. Spleen: Not enlarged. No focal lesion. No laceration, subcapsular hematoma, or vascular injury. Adrenals/Urinary Tract: No nodularity bilaterally. Bilateral kidneys enhance symmetrically. No hydronephrosis. No contusion, laceration, or subcapsular hematoma. Fluid in lesions of the kidneys likely represent simple renal cysts. Simple renal cysts, in the absence of clinically indicated signs/symptoms, require no independent follow-up. No injury to the vascular structures or collecting systems. No hydroureter. The urinary bladder is unremarkable. Stomach/Bowel: No small or large bowel wall thickening or dilatation. Stool throughout the colon. The appendix is unremarkable. Vasculature/Lymphatics: Severe atherosclerotic plaque. No abdominal aorta or iliac aneurysm. No active contrast extravasation or pseudoaneurysm. No abdominal, pelvic, inguinal lymphadenopathy. Reproductive: Prostate enlarged measuring up to 5.5 cm. Other: No simple free fluid ascites. No pneumoperitoneum. No hemoperitoneum. No mesenteric hematoma identified. No organized fluid collection. Musculoskeletal: No significant soft tissue hematoma. No acute pelvic  fracture. No spinal fracture. Severe degenerative changes of the lumbar spine. Dextroscoliosis of the thoracolumbar spine. Grade 1 anterolisthesis of L5 on S1 with associated bilateral L5 pars interarticularis defects. Ports and Devices: Right chest wall dual lead pacemaker. IMPRESSION: 1. Acute left sixth rib fracture.  No associated pneumothorax. 2. No acute intrathoracic, intra-abdominal, intrapelvic traumatic injury. 3. No  acute fracture or traumatic malalignment of the thoracic or lumbar spine. 4. Multiple pulmonary nodules. Most significant: Right solid pulmonary nodule measuring 12 mm. Per Fleischner Society Guidelines, consider a non-contrast Chest CT at 3 months, a PET/CT, or tissue sampling. These guidelines do not apply to immunocompromised patients and patients with cancer. Follow up in patients with significant comorbidities as clinically warranted. For lung cancer screening, adhere to Lung-RADS guidelines. Reference: Radiology. 2017; 284(1):228-43. 5. Prostatomegaly. 6. Aortic Atherosclerosis (ICD10-I70.0) -severe, including mitral annular, four-vessel pulmonary artery, and aortic valve leaflet calcifications-correlate for aortic stenosis. 7.  Emphysema (ICD10-J43.9). 8. Grade 1 anterolisthesis of L5 on S1 with associated bilateral L5 pars interarticularis defects. Electronically Signed   By: Tish Frederickson M.D.   On: 09/03/2023 19:25   CT HEAD WO CONTRAST ( ) Result Date: 09/03/2023 CLINICAL DATA:  Polytrauma, blunt. Multiple recent falls. On Eliquis. Head laceration. EXAM: CT HEAD WITHOUT CONTRAST CT CERVICAL SPINE WITHOUT CONTRAST TECHNIQUE: Multidetector CT imaging of the head and cervical spine was performed following the standard protocol without intravenous contrast. Multiplanar CT image reconstructions of the cervical spine were also generated. RADIATION DOSE REDUCTION: This exam was performed according to the departmental dose-optimization program which includes automated exposure control, adjustment of the mA and/or kV according to patient size and/or use of iterative reconstruction technique. COMPARISON:  Head CT 11/15/2014 FINDINGS: CT HEAD FINDINGS Brain: There is extensive vasogenic edema posteriorly in both cerebral hemispheres involving the parietal, occipital, and temporal lobes. There is evidence of an approximately 5 cm mass in the right temporal-occipital region, and there is evidence of a partially  cystic mass on the left. There is regional sulcal effacement bilaterally with trace rightward midline shift. There is effacement of the posterior aspects of both lateral ventricles with minimal prominence of both temporal horns which may reflect early trapping. The third and fourth ventricles are unchanged in size. There is mild cerebral atrophy. No acute intracranial hemorrhage, extra-axial fluid collection, or definite acute large territory cortically based infarct is identified. Vascular: Calcified atherosclerosis at the skull base. No hyperdense vessel. Skull: No acute fracture or destructive lesion. Sinuses/Orbits: Clear paranasal sinuses. Chronic complete left mastoid air cell opacification and partial left middle ear opacification. Small right mastoid effusion. No visible nasopharyngeal mass. Bilateral cataract extraction. Other: None. CT CERVICAL SPINE FINDINGS Alignment: Trace anterolisthesis of C4 on C5, C6 on C7, and C7 on T1. Trace retrolisthesis of C5 on C6. Skull base and vertebrae: No acute fracture or suspicious osseous lesion. Soft tissues and spinal canal: No prevertebral fluid or swelling. No visible canal hematoma. Disc levels: Moderately advanced disc degeneration at C4-5 greater than C5-6 with right-sided neural foraminal stenosis at these levels. No evidence of high-grade spinal stenosis. Upper chest: Emphysema. Other: Prominent atherosclerotic calcification at the carotid bifurcations. IMPRESSION: 1. Extensive vasogenic edema posteriorly in both cerebral hemispheres with evidence of underlying masses. An MRI of the brain without and with contrast is recommended for further evaluation. 2. No acute cervical spine fracture. Electronically Signed   By: Sebastian Ache M.D.   On: 09/03/2023 15:52   CT Cervical Spine Wo Contrast Result Date: 09/03/2023 CLINICAL DATA:  Polytrauma,  blunt. Multiple recent falls. On Eliquis. Head laceration. EXAM: CT HEAD WITHOUT CONTRAST CT CERVICAL SPINE WITHOUT  CONTRAST TECHNIQUE: Multidetector CT imaging of the head and cervical spine was performed following the standard protocol without intravenous contrast. Multiplanar CT image reconstructions of the cervical spine were also generated. RADIATION DOSE REDUCTION: This exam was performed according to the departmental dose-optimization program which includes automated exposure control, adjustment of the mA and/or kV according to patient size and/or use of iterative reconstruction technique. COMPARISON:  Head CT 11/15/2014 FINDINGS: CT HEAD FINDINGS Brain: There is extensive vasogenic edema posteriorly in both cerebral hemispheres involving the parietal, occipital, and temporal lobes. There is evidence of an approximately 5 cm mass in the right temporal-occipital region, and there is evidence of a partially cystic mass on the left. There is regional sulcal effacement bilaterally with trace rightward midline shift. There is effacement of the posterior aspects of both lateral ventricles with minimal prominence of both temporal horns which may reflect early trapping. The third and fourth ventricles are unchanged in size. There is mild cerebral atrophy. No acute intracranial hemorrhage, extra-axial fluid collection, or definite acute large territory cortically based infarct is identified. Vascular: Calcified atherosclerosis at the skull base. No hyperdense vessel. Skull: No acute fracture or destructive lesion. Sinuses/Orbits: Clear paranasal sinuses. Chronic complete left mastoid air cell opacification and partial left middle ear opacification. Small right mastoid effusion. No visible nasopharyngeal mass. Bilateral cataract extraction. Other: None. CT CERVICAL SPINE FINDINGS Alignment: Trace anterolisthesis of C4 on C5, C6 on C7, and C7 on T1. Trace retrolisthesis of C5 on C6. Skull base and vertebrae: No acute fracture or suspicious osseous lesion. Soft tissues and spinal canal: No prevertebral fluid or swelling. No visible  canal hematoma. Disc levels: Moderately advanced disc degeneration at C4-5 greater than C5-6 with right-sided neural foraminal stenosis at these levels. No evidence of high-grade spinal stenosis. Upper chest: Emphysema. Other: Prominent atherosclerotic calcification at the carotid bifurcations. IMPRESSION: 1. Extensive vasogenic edema posteriorly in both cerebral hemispheres with evidence of underlying masses. An MRI of the brain without and with contrast is recommended for further evaluation. 2. No acute cervical spine fracture. Electronically Signed   By: Sebastian Ache M.D.   On: 09/03/2023 15:52   DG Pelvis Portable Result Date: 09/03/2023 CLINICAL DATA:  Fall. EXAM: PORTABLE PELVIS 1-2 VIEWS COMPARISON:  None Available. FINDINGS: Subtle cortical irregularity of the left iliopectineal line, near the level of the left proximal superior pubic ramus. No evidence of diastasis. The femoral heads are seated within the acetabula. The sacroiliac joints and pubic symphysis appear anatomically aligned. Mild-to-moderate degenerative changes of the left-greater-than-right hips. Vascular calcifications are noted. IMPRESSION: Subtle cortical irregularity of the left iliopectineal line, near the level of the left proximal superior pubic ramus, is equivocal for fracture. Consider further evaluation with CT of the pelvis. Electronically Signed   By: Hart Robinsons M.D.   On: 09/03/2023 14:54   DG Hand Complete Left Result Date: 09/03/2023 CLINICAL DATA:  Fall and trauma to the left hand. EXAM: LEFT HAND - COMPLETE 3+ VIEW COMPARISON:  None Available. FINDINGS: No acute fracture or dislocation. The bones are osteopenic. The soft tissues are unremarkable. IMPRESSION: 1. No acute fracture or dislocation. 2. Osteopenia. Electronically Signed   By: Elgie Collard M.D.   On: 09/03/2023 14:53   DG Knee Complete 4 Views Left Result Date: 09/03/2023 CLINICAL DATA:  Fall and trauma to the left knee.  Pain. EXAM: LEFT KNEE -  COMPLETE 4+ VIEW COMPARISON:  None Available. FINDINGS: There is no acute fracture or dislocation. The bones are osteopenic. There is mild arthritic changes with spurring. No significant joint effusion. The soft tissues are unremarkable. Small focus of calcification in the soft tissues of the posterior knee. IMPRESSION: 1. No acute fracture or dislocation. 2. Mild arthritic changes. Electronically Signed   By: Elgie Collard M.D.   On: 09/03/2023 14:52   DG Chest Portable 1 View Result Date: 09/03/2023 CLINICAL DATA:  Fall. EXAM: PORTABLE CHEST 1 VIEW COMPARISON:  Chest radiograph dated July 03, 2010. FINDINGS: The heart size and mediastinal contours are within normal limits. Right subclavian AICD in place. Aortic atherosclerosis. Chronic appearing bilateral interstitial changes, most pronounced at the lung bases. Mild left basilar atelectasis/scarring. No focal consolidation, sizeable pleural effusion, or pneumothorax. Diffuse osseous demineralization. Subtle cortical irregularity of the left lateral sixth rib. IMPRESSION: 1. Subtle cortical irregularity of the left lateral sixth rib could represent a fracture. Recommend correlation with point tenderness. No pneumothorax. 2. Chronic appearing bilateral interstitial changes, most pronounced at the lung bases. Electronically Signed   By: Hart Robinsons M.D.   On: 09/03/2023 14:46   CUP PACEART REMOTE DEVICE CHECK Result Date: 08/25/2023 Scheduled remote reviewed. Normal device function.  Presenting AF, burden 100% since April 2024.  Known persistent AF, on Eliquis per Epic. Next remote 91 days. - CS, CVRS  I have personally spent 87 minutes involved in face-to-face and non-face-to-face activities for this patient on the day of the visit. Professional time spent includes the following activities: Preparing to see the patient (review of tests), Obtaining and/or reviewing separately obtained history (admission/discharge record), Performing a medically  appropriate examination and/or evaluation , Ordering medications/tests/procedures, referring and communicating with other health care professionals, Documenting clinical information in the EMR, Independently interpreting results (not separately reported), Communicating results to the patient/family/caregiver, Counseling and educating the patient/family/caregiver and Care coordination (not separately reported).  Electronically signed by:   Plan d/w requesting provider as well as ID pharm D  Of note, portions of this note may have been created with voice recognition software. While this note has been edited for accuracy, occasional wrong-word or 'sound-a-like' substitutions may have occurred due to the inherent limitations of voice recognition software.   Odette Fraction, MD Infectious Disease Physician [facility] Infectious Disease Pager: [phone]

## 2023-09-07 NOTE — Progress Notes (Signed)
NEUROLOGY CONSULT FOLLOW UP NOTE   Date of service: September 07, 2023 Patient Name: Gerald Hardin. MRN:  [id number] DOB:  05/02/1934  Interval Hx/subjective  Patient has been hemodynamically stable and afebrile overnight.  His neurological exam is stable.  No new neurologic complaints.  Vitals   Vitals:   09/06/23 2133 09/07/23 0032 09/07/23 0527 09/07/23 1606  BP: 128/63 (!) 120/53 (!) 118/95 (!) 137/49  Pulse: 70 70 70 69  Resp: 17 17 18 16   Temp: 98.1 F (36.7 C) 98.1 F (36.7 C) 97.9 F (36.6 C)   TempSrc:      SpO2: 99% 96% 98% 100%  Weight:      Height:         Body mass index is 31.38 kg/m.  Physical Exam   Constitutional: Frail-appearing elderly patient in no acute distress Psych: Affect appropriate to situation.  Eyes: No scleral injection.  HENT: No OP obstrucion.  Head: Multiple bruises to head Respiratory: Effort normal, non-labored breathing.  Skin: WDI.   Neurologic Examination    NEURO:  Mental Status: Patient is alert and oriented to person and place but states that year is 70, month is November and then he is in the hospital for surgery on his right arm Speech/Language: speech is without dysarthria or aphasia.    Cranial Nerves:  II: PERRL.  III, IV, VI: EOMI. Eyelids elevate symmetrically.  V: Sensation is intact to light touch and symmetrical to face.  VII: Subtle right facial droop VIII: hearing intact to voice. IX, X: Phonation is normal.  EX:[id number] shrug 5/5. XII: tongue is midline without fasciculations. Motor: Able to move left upper extremity with good antigravity strength, able to raise right upper extremity off bed, but mobility is limited due to old injury, moves bilateral lower extremities but does not lift them off the bed Tone: is normal and bulk is normal Sensation- Intact to light touch bilaterally.  Gait- deferred    Medications  Current Facility-Administered Medications:    acetaminophen (TYLENOL) tablet 650 mg,  650 mg, Oral, Q6H PRN, Jena Gauss, Allee, MD, 650 mg at 09/07/23 1353   fenofibrate tablet 54 mg, 54 mg, Oral, Daily, Jena Gauss, Allee, MD, 54 mg at 09/07/23 0912   ipratropium-albuterol (DUONEB) 0.5-2.5 (3) MG/3ML nebulizer solution 3 mL, 3 mL, Nebulization, Q6H PRN, Jena Gauss, Allee, MD   isosorbide mononitrate (IMDUR) 24 hr tablet 30 mg, 30 mg, Oral, Daily, Maxwell, Allee, MD, 30 mg at 09/07/23 0912   levETIRAcetam (KEPPRA) tablet 500 mg, 500 mg, Oral, BID, Gerrit Heck, DO, 500 mg at 09/07/23 1714   LORazepam (ATIVAN) injection 0.5 mg, 0.5 mg, Intravenous, Once PRN, Everhart, Kirstie, DO   nicotine (NICODERM CQ - dosed in mg/24 hours) patch 21 mg, 21 mg, Transdermal, Daily, Everhart, Kirstie, DO, 21 mg at 09/07/23 0911   simvastatin (ZOCOR) tablet 10 mg, 10 mg, Oral, Daily, Maxwell, Allee, MD, 10 mg at 09/07/23 0911  Facility-Administered Medications Ordered in Other Encounters:    regadenoson (LEXISCAN) injection SOLN 0.4 mg, 0.4 mg, Intravenous, Once, Chilton Si, MD   technetium tetrofosmin (TC-MYOVIEW) injection 31.9 millicurie, 31.9 millicurie, Intravenous, Once PRN, Chilton Si, MD  Labs and Diagnostic Imaging   CBC:  Recent Labs  Lab 09/03/23 1315 09/04/23 0402 09/05/23 1914 09/07/23 0826  WBC 13.5*   < > 13.2* 12.9*  NEUTROABS 10.3*  --   --   --   HGB 13.4   < > 12.0* 13.0  HCT 39.8   < > 35.2* 37.8*  MCV 92.8   < > 91.7 90.2  PLT 246   < > 202 227   < > = values in this interval not displayed.    Basic Metabolic Panel:  Lab Results  Component Value Date   NA 138 09/05/2023   K 4.3 09/05/2023   CO2 22 09/05/2023   GLUCOSE 101 (H) 09/05/2023   BUN 28 (H) 09/05/2023   CREATININE 1.19 09/05/2023   CALCIUM 8.7 (L) 09/05/2023   GFRNONAA 59 (L) 09/05/2023   GFRAA 61 (L) 11/15/2014   Lipid Panel: No results found for: "LDLCALC" HgbA1c:  Lab Results  Component Value Date   HGBA1C 5.0 09/05/2023   Urine Drug Screen: No results found for: "LABOPIA",  "COCAINSCRNUR", "LABBENZ", "AMPHETMU", "THCU", "LABBARB"  Alcohol Level No results found for: "ETH" INR  Lab Results  Component Value Date   INR 3.4 05/28/2011   APTT  Lab Results  Component Value Date   APTT 36 06/30/2010   Cryptococcal antigen negative Toxoplasma IgG and IgM less than 3 HIV negative QuantiFERON TB pending  CT Head without contrast(Personally reviewed): Extensive vasogenic edema in both posterior cerebral hemispheres with evidence of underlying masses  MRI Brain(Personally reviewed): Peripherally enhancing masses in bilateral posterior temporal and occipital lobes with significant surrounding edema, 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 is a 89 y.o. male with history of diabetes, A-fib on Eliquis, COPD, hyperlipidemia, hypertension, heart block status post pacemaker and remote surgery on the right arm due to an old injury who presented after multiple falls.  MRI brain reveals bilateral peripherally enhancing masses in temporal and occipital lobes.  These lesions are favored to represent metastases, however no primary neoplasm has been found.  It is also possible that these lesions represent an infectious process, and patient is afebrile but does have a mild leukocytosis.  Oncology has seen him who said that if these are malignant lesions that he would not be a candidate for highly aggressive treatment.  Infectious disease has seen him as well and while they think that malignancy is more likely they said an LP would be reasonable if neurology felt that it was safe to do.  I do not feel based on his imaging that  an LP is safe.  He has significant mass effect in both hemispheres and the risk of herniation would be too high.  If oncology feels that it would be helpful to differentiate malignancy versus infection then it may be reasonable to consider an inpatient PET scan.  If not, my recommendation would be to repeat an MRI brain with and without contrast in 3 weeks.  Patient's overall prognosis is poor, see also palliative care note, and a LP with risk of herniation or very invasive procedure such as a brain biopsy are not recommended in this context.  Recommendations  --Consider PET scan inpatient if oncology feels it would be helpful. -- Otherwise recommend outpatient MRI brain wwo in 3 weeks. Neurology will arrange outpatient neurology f/u for this. -- Will f/u tomorrow  ______________________________________________________________________  Bing Neighbors, MD [facility] [phone]  If 7pm- 7am, please page neurology on call as listed in AMION.

## 2023-09-07 NOTE — Progress Notes (Addendum)
Daily Progress Note Intern Pager: [phone]  Patient name: Gerald Hardin. Medical record number: [id number] Date of birth: [birth year] Age: 89 y.o. Gender: male  Primary Care Provider: Patient, No Pcp Per Consultants: Neurology, oncology, palliative, ID Code Status: Full  Pt Overview and Major Events to Date:  1/29- admitted 1/31- palliative and oncology consulted  Assessment and Plan: [name]. Is a 89 y.o. 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 Pertinent PMH/PSH includes A-fib on Eliquis, complete heart block with pacemaker, HTN, COPD.  Assessment & Plan Fall Most likely due to brain masses discussed elsewhere.  Will continue to monitor for fall precautions and provide appropriate care. -PT OT treatment Bilateral Temporal-Occipital Brain Lesions Continuing to follow-up on neuro workup to differentiate between suspected neoplasm versus possible infectious sources -- thus far negative. EEG with abnormalities suggestive of structural cause. RPR, HIV, Toxoplasma, cryptococcus negative. Awaiting QuantTB.  Holding Eliquis x 3 days for LP. -Neuro recommendations (suspect neoplasm), appreciate recommendations - F/u quantiferon; plan to obtain LP - Oncology following, appreciate recommendations - Palliative care consulted - Interrogate pacer  - Fall precautions  Superficial abrasion Multiple superficial abrasions. Monitor and keep clean. Laceration of head Dermabond from EDP.  Right hip pain CT pelvis: No acute intrathoracic, intra-abdominal, intrapelvic traumatic injury. Painful on examination without overt deformities or abnormalities. Limited ROM. - PT/OT  Right shoulder pain Chronic limitation to right hand and upper extremity with history of squamous cell CA and prior XRT/surgery. Acute on chronic right shoulder  pain. CT shows degenerative changes of b/l shoulders, may require dedicated imaging. No acute deformities or abnormalities. Not complaining of shoulder pain this morning - will continue to monitor Medication management Multiple vitamins & OTC medications used at home: B-Complex, Glucosamine & Chondroitin complex, CoQ10, Lutein, Niacin, Zeaxanthin, Vit A PO, Vit D3. Holding most for now, patient very particular about his medications. Vitamin D level is low - Will need med rec outpatient Rib fracture Left sixth rib fracture. Symptomatic management.  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 Not a good surgical candidate.  No pain at this time -Will continue to monitor for back pain or new weakness Pulmonary nodules Multiple pulmonary nodules found incidentally on CT chest, right solid pulmonary nodule 12 mm. - Will need outpatient follow-up   Chronic and Stable Issues:  Polypharmacy-outpatient med rec of multiple supplements Complete heart block with pacemaker/persistent AF on Eliquis-holding eliquis  Hypertension: Continue Imdur 30 mg, holding trandolapril-verapamil 4-240 mg COPD: DuoNebs as needed, IS, pulmonary toiletry  FEN/GI: Regular PPx: SCDs Dispo:Home  pending clinical workup/improvement .   Subjective:  Patient has numerous complaints however these are not related to his illness and are related to the care he has received.  Does not want to stay here and states he cannot have the lumbar puncture at home.  He continues to mix up which days he has been here.  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  Objective: Temp:  [97.9 F (36.6 C)-98.1 F (36.7 C)] 97.9 F (36.6 C)  (02/02 0527) Pulse Rate:  [  70] 70 (02/02 0527) Resp:  [16-18] 18 (02/02 0527) BP: (118-128)/(53-95) 118/95 (02/02 0527) SpO2:  [96 %-100 %] 98 % (02/02 0527) Physical Exam: General: Elderly appearing, NAD Cardiovascular: RRR, murmurs auscultated Respiratory: CTAB, significant cough, normal WOB Abdomen: Soft, nontender, normoactive bowel sounds Extremities: Moves upper extremities appropriately  Laboratory: Most recent CBC Lab Results  Component Value Date   WBC 13.2 (H) 09/05/2023   HGB 12.0 (L) 09/05/2023   HCT 35.2 (L) 09/05/2023   MCV 91.7 09/05/2023   PLT 202 09/05/2023   Most recent BMP    Latest Ref Rng & Units 09/05/2023    7:14 PM  BMP  Glucose 70 - 99 mg/dL 016   BUN 8 - 23 mg/dL 28   Creatinine 0.10 - 1.24 mg/dL 9.32   Sodium 355 - 732 mmol/L 138   Potassium 3.5 - 5.1 mmol/L 4.3   Chloride 98 - 111 mmol/L 107   CO2 22 - 32 mmol/L 22   Calcium 8.9 - 10.3 mg/dL 8.7    Imaging/Diagnostic Tests: No recent imaging results.  Shelby Mattocks, DO 09/07/2023, 7:06 AM  PGY-3, [facility] FPTS Intern pager: [phone], text pages welcome Secure chat group [facility] Teaching Service

## 2023-09-07 NOTE — Assessment & Plan Note (Addendum)
 Multiple pulmonary nodules found incidentally on CT chest, right solid pulmonary nodule 12 mm. - Will need outpatient follow-up

## 2023-09-07 NOTE — Consult Note (Addendum)
Palliative Care Consult Note                                  Date: 09/07/2023   Patient Name: Gerald Hardin.  DOB: 1934/01/07  MRN: [id number]  Age / Sex: 89 y.o., male  PCP: Patient, No Pcp Per Referring Physician: Westley Chandler, MD  Reason for Consultation: Establishing goals of care  HPI/Patient Profile: 89 y.o. 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   PMT was consulted for GOC conversations.  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  I met with the patient the bedside.  Also present was the patient's wife Gerald Hardin and youngest grandson Gerald Hardin, who is here in support from [place].  Michaelle Copas gave permission to add Gerald Hardin to the chart.   We meet to discuss diagnosis prognosis, GOC, EOL wishes, disposition and options. Concept of Palliative Care was introduced as specialized medical care for people and their families living with serious illness.  If focuses on  providing relief from the symptoms and stress of a serious illness.  The goal is to improve quality of life for both the patient and the family. Values and goals of care important to patient and family were attempted to be elicited.  Created space and opportunity for patient  and family to explore thoughts and feelings regarding current medical situation   Natural trajectory and current clinical status were discussed. Questions and concerns addressed. Patient  encouraged to call with questions or concerns.    Patient/Family Understanding of Illness: Family understands there is something wrong with his brain that is likely cancer but possibly infection.  They know that there are decisions and possible procedures to help with workup to decide which.  We spent a good amount of time discussing clinical details about his situation.  Life Review: The patient and his wife been married for 60 years.  They had 1 son who passed away in 10-10-2013.  Additionally, they have 2 grandsons.  The patient is originally from [place].  Goals: At this time to identify etiology and explore options for cure.  In the absence of cure, understand the need to see [place] options for how to care for him despite terminal illness.  Today's Discussion: In addition to discussions described above we had extensive discussion of various topics.  We explored his clinical situation.  We have a detailed discussion about  the difference tween hospice and palliative care.  I shared that in the hospital palliative care's role is to help support and guide with decision making and understanding of clinical situations.  I shared that in the outpatient role palliative care helps with symptom management and ongoing support while walking a serious illness journey.  I described hospice as a service for patients who have a life expectancy of 6 months or less. The goal of hospice is the preservation of dignity and quality at the end phases of life. Under  hospice care, the focus changes from curative to symptom relief. I explained the three setting where hospice services can be provided including the home, at a living facility (such as LTC SNF, Assisted Living, etc), and a hospice facility. I explained that acceptance to hospice in any specific location is the final decision of the hospice medical director and bed availability, if applicable. They verbalized understanding.  We shared the big question me a right now is whether these brain lesions are malignancy or infection.  It does not appear likely to be infection, although there is the possibility of a lumbar puncture Wednesday to decide.  Infectious disease is coming on board to help tease through whether this possibly infection in the hope of avoiding a lumbar puncture which the patient may not tolerate well.  Infectious disease is supposed to see the patient today.  We did discuss that it is malignancy there would likely be limited to no treatment options.  They understand that point we would have to have discussions about hospice and comfort care to see if that would be in line with his goals.  I shared that there is no need to make a decision today about workup including possible lumbar puncture or treatment.  I explained that I would be off for the next couple days but would have a colleague check-in in the next day or 2 to see if we have any clarity on the situation that could help [place] better prognosticate and explore options.  I provided a contact card with palliative medicine contact information and encouraged them to notify us of any need for discussion or to better support. I provided emotional and general support through therapeutic listening, empathy, sharing of stories, and other techniques. I answered all questions and addressed all concerns to the best of my ability.  Limited due to wandering thoughts/confusion Review of Systems  Constitutional:        Denies significant complaints today   Respiratory:  Negative for shortness of breath.   Gastrointestinal:  Negative for abdominal pain, nausea and vomiting.  Musculoskeletal:        Shoulder pain, requesting Tylenol    Objective:   Primary Diagnoses: Present on Admission:  Fall  Superficial abrasion  Right hip pain  Right shoulder pain  Laceration of head  Abnormal brain CT   Physical Exam Vitals and nursing note reviewed.  Constitutional:      General: He is not in acute distress.    Appearance: He is ill-appearing.  HENT:     Head: Normocephalic and atraumatic.     Ears:     Comments: HOH Pulmonary:     Effort: Pulmonary effort is normal. No respiratory distress.  Abdominal:     General: Abdomen is flat.  Skin:    General: Skin is warm and dry.  Neurological:     General: No focal deficit present.     Mental Status: He is alert.  Psychiatric:  Comments: Scattered thinking/train of thought     Vital Signs:  BP (!) 118/95 (BP Location: Left Arm)   Pulse 70   Temp 97.9 F (36.6 C)   Resp 18   Ht 5\' 11"  (1.803 m)   Wt 102.1 kg   SpO2 98%   BMI 31.38 kg/m   Palliative Assessment/Data: 50%    Advanced Care Planning:   Existing Vynca/ACP Documentation: None  Primary Decision Maker: NEXT OF KIN  Code Status/Advance Care Planning: Full code  A discussion was had today regarding advanced directives. Concepts specific to code status, artifical feeding and hydration, continued IV antibiotics and rehospitalization was had.  The difference between a aggressive medical intervention path and a palliative comfort care path for this patient at this time was had.   Decisions/Changes to ACP: None today  Assessment & Plan:   Impression: 89 year old male with acute presentation chronic comorbidities as described above.  At this point we are at a fork in the road, needing to help decipher whether his brain lesions are infectious or malignant.  The team overall feels it is very very likely to be  malignant.  ID consulted, hopeful for visit today to help with workup on whether this is an infection.  They may need to proceed with a lumbar puncture on Wednesday (holding Eliquis) although we are hopeful to avoid this.  If it is malignant there is likely very little that could be done, although oncology would make official recommendations after diagnostic confirmation.  They understand the difference between palliative care and hospice.  They understand if it is malignant then hospice/comfort care may be a very appropriate conversation.  Palliative medicine will continue to follow, overall prognosis poor.  SUMMARY OF RECOMMENDATIONS   Remain full code Full scope of care Palliative medicine will follow for confirmation of infection versus malignancy At that point ongoing GOC conversations to help tease through options for how to move forward Palliative medicine will continue to follow  Symptom Management:  Per primary team PMT is available to assist as needed  Prognosis:  Unable to determine  Discharge Planning:  To Be Determined   Discussed with: Patient, family, medical team, nursing team    Thank you for allowing [place] to participate in the care of Gerald Hardin. PMT will continue to support holistically.  Time Total: 76 min  Detailed review of medical records (labs, imaging, vital signs), medically appropriate exam, discussed with treatment team, counseling and education to patient, family, & staff, documenting clinical information, medication management, coordination of care  Signed by: Wynne Dust, NP Palliative Medicine Team  Team Phone # [phone] (Nights/Weekends)  09/07/2023, 2:11 PM

## 2023-09-07 NOTE — Plan of Care (Signed)
FMTS Interim Progress Note  Discussed case with Dr. Elinor Parkinson with infectious disease. ID will evaluate the patient either today or tomorrow.  Vonna Drafts, MD 09/07/2023, 11:34 AM PGY-2, [facility] Family Medicine Service pager [phone]

## 2023-09-07 NOTE — Assessment & Plan Note (Addendum)
Multiple superficial abrasions. Monitor and keep clean.

## 2023-09-07 NOTE — Assessment & Plan Note (Addendum)
 Chronic limitation to right hand and upper extremity with history of squamous cell CA and prior XRT/surgery. Acute on chronic right shoulder pain. CT shows degenerative changes of b/l shoulders, may require dedicated imaging. No acute deformities or abnormalities. Not complaining of shoulder pain this morning - will continue to monitor

## 2023-09-07 NOTE — Assessment & Plan Note (Addendum)
"  Bilateral L4 and L5 pars interarticularis defects with grade 1 anterolisthesis of L5 on S1." Questionable of this contribution to falls. Not a good surgical candidate.  No pain at this time -Will continue to monitor for back pain or new weakness

## 2023-09-07 NOTE — Plan of Care (Signed)

## 2023-09-07 NOTE — Assessment & Plan Note (Addendum)
Most likely due to brain masses discussed elsewhere.  Will continue to monitor for fall precautions and provide appropriate care. -PT OT treatment

## 2023-09-08 DIAGNOSIS — G9389 Other specified disorders of brain: Secondary | ICD-10-CM | POA: Diagnosis not present

## 2023-09-08 DIAGNOSIS — Z7189 Other specified counseling: Secondary | ICD-10-CM | POA: Diagnosis not present

## 2023-09-08 DIAGNOSIS — Z515 Encounter for palliative care: Secondary | ICD-10-CM | POA: Diagnosis not present

## 2023-09-08 DIAGNOSIS — W19XXXA Unspecified fall, initial encounter: Secondary | ICD-10-CM | POA: Diagnosis not present

## 2023-09-08 LAB — CBC
HCT: 34.9 % — ABNORMAL LOW (ref 39.0–52.0)
Hemoglobin: 11.9 g/dL — ABNORMAL LOW (ref 13.0–17.0)
MCH: 30.7 pg (ref 26.0–34.0)
MCHC: 34.1 g/dL (ref 30.0–36.0)
MCV: 89.9 fL (ref 80.0–100.0)
Platelets: 204 10*3/uL (ref 150–400)
RBC: 3.88 MIL/uL — ABNORMAL LOW (ref 4.22–5.81)
RDW: 13.4 % (ref 11.5–15.5)
WBC: 9.7 10*3/uL (ref 4.0–10.5)
nRBC: 0 % (ref 0.0–0.2)

## 2023-09-08 LAB — VITAMIN A: Vitamin A (Retinoic Acid): 26.6 ug/dL (ref 22.0–69.5)

## 2023-09-08 LAB — BASIC METABOLIC PANEL
Anion gap: 9 (ref 5–15)
BUN: 21 mg/dL (ref 8–23)
CO2: 21 mmol/L — ABNORMAL LOW (ref 22–32)
Calcium: 8.3 mg/dL — ABNORMAL LOW (ref 8.9–10.3)
Chloride: 105 mmol/L (ref 98–111)
Creatinine, Ser: 1.1 mg/dL (ref 0.61–1.24)
GFR, Estimated: >60 mL/min (ref >60)
Glucose, Bld: 110 mg/dL — ABNORMAL HIGH (ref 70–99)
Potassium: 3.9 mmol/L (ref 3.5–5.1)
Sodium: 135 mmol/L (ref 135–145)

## 2023-09-08 LAB — GLUCOSE, CAPILLARY: Glucose-Capillary: 108 mg/dL — ABNORMAL HIGH (ref 70–99)

## 2023-09-08 MED ORDER — APIXABAN 5 MG PO TABS
5.0000 mg | ORAL_TABLET | Freq: Two times a day (BID) | ORAL | Status: DC
  Administered 2023-09-08 – 2023-09-10 (×4): 5 mg via ORAL
  Filled 2023-09-08 (×5): qty 1

## 2023-09-08 NOTE — Assessment & Plan Note (Addendum)
Discharge planning was broached with the family this afternoon.  His wife is concerned that he will not have safe dispo home as she is wheelchair-bound and unable to assist him with ambulation.  Grandson present at bedside this afternoon and agrees with this assessment.   -PT OT recommendation follow-up -TOC consult for discharge planning

## 2023-09-08 NOTE — Progress Notes (Addendum)
     Daily Progress Note Intern Pager: [phone]  Patient name: Gerald Hardin. Medical record number: [id number] Date of birth: 09-23-1933 Age: 89 y.o. Gender: male  Primary Care Provider: Patient, No Pcp Per Consultants: ID, Neuro, Oncology Code Status: Full  Pt Overview and Major Events to Date:  1/29-admitted 1/31-palliative and oncology consulted  Assessment and Plan:  Patient is an 89 year old male who presented with frequent falls and was found to have bilateral enhancing masses in the posterior temporal and occipital lobes with surrounding edema.  Currently suspect these lesions to be necrotic neoplasms.  He has a PMH of A-fib on Eliquis, complete heart block with pacemaker, hypertension and COPD. Assessment & Plan Bilateral Temporal-Occipital Brain Lesions Infectious workup negative still, waiting for quant. Neuro/onc would like him to get a PET scan, however these are typically not done inpatient. -Neuro recommendations (suspect neoplasm), appreciate recommendations -Neuro no longer recommends LP after confirmation with ID -Favor PET scan or follow-up MRI in 4 weeks -Continue Keppra for seizure prophylaxis - Oncology following, appreciate recommendations - Palliative care consulted -I have asked for pacemaker interrogation. - Fall precautions  - has lung nodules on CT, could be primary nodules but would need PET for characterization Fall Discharge planning was broached with the family this afternoon.  His wife is concerned that he will not have safe dispo home as she is wheelchair-bound and unable to assist him with ambulation.  Grandson present at bedside this afternoon and agrees with this assessment.   -PT OT recommendation follow-up -TOC consult for discharge planning  Chronic and Stable Problems:  A-fib with pacemaker for heart block: Eliquis Polypharmacy: Outpatient med rec COPD: DuoNebs as needed, IS, pulmonary toiletry HTN: Imdur 30 mg, holding  trandolapril-verapamil 4-240 mg R sided MSK concerns: continue with pain control and monitoring  FEN/GI: Regular PPx: Eliquis Dispo:Pending PT recommendations  pending clinical improvement .   Subjective:  Patient resting comfortably in bed this morning with no concerns.  Remains pleasantly confused  Objective: Temp:  [98 F (36.7 C)] 98 F (36.7 C) (02/02 2037) Pulse Rate:  [69-70] 70 (02/03 0758) Resp:  [15-18] 16 (02/03 0758) BP: (114-152)/(48-93) 114/93 (02/03 0758) SpO2:  [98 %-100 %] 99 % (02/03 0758) Physical Exam: General: Elderly appearing male with multiple healing bruises and scrapes, no distress Cardiovascular: RRR, no M/R/G Respiratory: Appropriate air movement bilaterally, intermittent wheezing Abdomen: Flat, soft, nontender Extremities: 2+ peripheral pulses in the bilateral upper and lower extremities.  Laboratory: Most recent CBC Lab Results  Component Value Date   WBC 9.7 09/08/2023   HGB 11.9 (L) 09/08/2023   HCT 34.9 (L) 09/08/2023   MCV 89.9 09/08/2023   PLT 204 09/08/2023   Most recent BMP    Latest Ref Rng & Units 09/08/2023    6:32 AM  BMP  Glucose 70 - 99 mg/dL 732   BUN 8 - 23 mg/dL 21   Creatinine 2.02 - 1.24 mg/dL 5.42   Sodium 706 - 237 mmol/L 135   Potassium 3.5 - 5.1 mmol/L 3.9   Chloride 98 - 111 mmol/L 105   CO2 22 - 32 mmol/L 21   Calcium 8.9 - 10.3 mg/dL 8.3     Gerrit Heck, DO 09/08/2023, 8:20 AM  PGY-1, [facility] Family Medicine FPTS Intern pager: [phone], text pages welcome Secure chat group [facility] Teaching Service

## 2023-09-08 NOTE — Assessment & Plan Note (Addendum)
Infectious workup negative still, waiting for quant. Neuro/onc would like him to get a PET scan, however these are typically not done inpatient. -Neuro recommendations (suspect neoplasm), appreciate recommendations -Neuro no longer recommends LP after confirmation with ID -Favor PET scan or follow-up MRI in 4 weeks -Continue Keppra for seizure prophylaxis - Oncology following, appreciate recommendations - Palliative care consulted -I have asked for pacemaker interrogation. - Fall precautions  - has lung nodules on CT, could be primary nodules but would need PET for characterization

## 2023-09-08 NOTE — Progress Notes (Signed)
   Palliative Medicine Inpatient Follow Up Note   HPI: 89 y.o. 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    PMT was consulted for GOC conversations.  Today's Discussion 09/08/2023  *Please note that this is a verbal dictation therefore any spelling or grammatical errors are due to the "[employer] One" system interpretation.  Chart reviewed inclusive of vital signs, progress notes, laboratory results, and diagnostic images.   I met with Tyshon, his wife, Kathie Rhodes, and his grandson, Jayme at bedside this afternoon. I introduced myself and my role on the Palliative medicine team. Damacio himself voices concerns in regards to his eliquis being held then restarted. We reviewed the reasoning behind this in the setting of him not receiving an LP any longer per both the Neurology and Infectious disease team(s). Ayiden still requests to speak to the primary medical team. I shared I would pass this along.  I met outside of the room with patients grandson who shares that he and his grandmother are relying on the primary medical team to help guide their decisions. He reviews allowing more time for outcomes and better determination of best next steps.   I shared I would remain involved over the oncoming days to assist in additional conversations regarding goals moving forward.   Questions and concerns addressed/Palliative Support Provided.   Objective Assessment: Vital Signs Vitals:   09/08/23 0452 09/08/23 0758  BP: (!) 133/48 (!) 114/93  Pulse: 70 70  Resp: 18 16  Temp:    SpO2: 100% 99%    Intake/Output Summary (Last 24 hours) at  09/08/2023 1328 Last data filed at 09/08/2023 0501 Gross per 24 hour  Intake --  Output 325 ml  Net -325 ml   Last Weight  Most recent update: 09/03/2023  1:15 PM    Weight  102.1 kg (225 lb)            Gen:  Frail elderly Caucasian M chronically ill appearing HEENT: moist mucous membranes CV: Regular rate and irregular rhythm  PULM: On RA, breathing is even and nonlabored ABD: soft/nontender  EXT: Multiple ecchymotic areas on extremities Neuro: Aware of self and place  SUMMARY OF RECOMMENDATIONS   Remain full code Full scope of care Palliative medicine will follow for confirmation of infection versus malignancy At that point ongoing GOC conversations to help tease through options for how to move forward Palliative medicine will continue to follow  Billing based on MDM: Moderate  ______________________________________________________________________________________ Lamarr Lulas [place] Palliative Medicine Team Team Cell Phone: [phone] Please utilize secure chat with additional questions, if there is no response within 30 minutes please call the above phone number  Palliative Medicine Team providers are available by phone from 7am to 7pm daily and can be reached through the team cell phone.  Should this patient require assistance outside of these hours, please call the patient's attending physician.

## 2023-09-08 NOTE — Plan of Care (Signed)

## 2023-09-08 NOTE — Progress Notes (Signed)
NEUROLOGY CONSULT FOLLOW UP NOTE   Date of service: September 08, 2023 Patient Name: Gerald Hardin. MRN:  [id number] DOB:  08-22-1933  Interval Hx/subjective  No significant events overnight.   Vitals   Vitals:   09/07/23 1606 09/07/23 2037 09/08/23 0031 09/08/23 0452  BP: (!) 137/49 (!) 152/61 128/60 (!) 133/48  Pulse: 69 69 70 70  Resp: 16 15 16 18   Temp:  98 F (36.7 C)    TempSrc:  Oral    SpO2: 100% 99% 98% 100%  Weight:      Height:         Body mass index is 31.38 kg/m.  Physical Exam   HEENT: Bruising to left periorbital/temporal region. Small laceration with dressing to right scalp.  Lungs: Respirations unlabored Ext: No edema  NEURO:  Mental Status: Patient is awake and alert. He is partially oriented. He perseverates about "when am I having surgery". Poor insight.  Speech is without dysarthria or aphasia.  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   Cranial Nerves:  II: Severe loss of visual acuity bilaterally. Able to identify examiner's hand but cannot see the glove that is on it and cannot consistently count fingers. Cannot visually identify a pen. Can correctly state which direction one of the wall-clock hands is pointing in, but cannot tell the time. Optic ataxia noted with difficulty targeting his index fingers.  III, IV, VI: EOMI. Eyelids elevate symmetrically.  V: Sensation is intact to light touch   VII: Symmetric VIII: HOH IX, X: Phonation is normal.  XI: Symmetric XII: Tongue is midline without fasciculations. Motor: Able to move left upper extremity with good antigravity strength, able to raise right upper extremity off bed, but mobility is limited due to old injury, moves bilateral lower extremities but does not lift them off the bed at hips; Knee extension is 4-/5 bilatearally Sensation- Intact to light touch bilaterally.  Gait- deferred  Medications  Current Facility-Administered Medications:    acetaminophen (TYLENOL) tablet 650 mg, 650  mg, Oral, Q6H PRN, Jena Gauss, Allee, MD, 650 mg at 09/07/23 1353   fenofibrate tablet 54 mg, 54 mg, Oral, Daily, Jena Gauss, Allee, MD, 54 mg at 09/07/23 0912   ipratropium-albuterol (DUONEB) 0.5-2.5 (3) MG/3ML nebulizer solution 3 mL, 3 mL, Nebulization, Q6H PRN, Jena Gauss, Allee, MD   isosorbide mononitrate (IMDUR) 24 hr tablet 30 mg, 30 mg, Oral, Daily, Maxwell, Allee, MD, 30 mg at 09/07/23 0912   levETIRAcetam (KEPPRA) tablet 500 mg, 500 mg, Oral, BID, Gerrit Heck, DO, 500 mg at 09/08/23 4098   LORazepam (ATIVAN) injection 0.5 mg, 0.5 mg, Intravenous, Once PRN, Everhart, Kirstie, DO   nicotine (NICODERM CQ - dosed in mg/24 hours) patch 21 mg, 21 mg, Transdermal, Daily, Everhart, Kirstie, DO, 21 mg at 09/07/23 0911   simvastatin (ZOCOR) tablet 10 mg, 10 mg, Oral, Daily, Jena Gauss, Allee, MD, 10 mg at 09/07/23 0911  Facility-Administered Medications Ordered in Other Encounters:    regadenoson (LEXISCAN) injection SOLN 0.4 mg, 0.4 mg, Intravenous, Once, Chilton Si, MD   technetium tetrofosmin (TC-MYOVIEW) injection 31.9 millicurie, 31.9 millicurie, Intravenous, Once PRN, Chilton Si, MD  Labs and Diagnostic Imaging   CBC:  Recent Labs  Lab 09/03/23 1315 09/04/23 0402 09/07/23 0826 09/08/23 0632  WBC 13.5*   < > 12.9* 9.7  NEUTROABS 10.3*  --   --   --   HGB 13.4   < > 13.0 11.9*  HCT 39.8   < > 37.8* 34.9*  MCV 92.8   < >  90.2 89.9  PLT 246   < > 227 204   < > = values in this interval not displayed.    Basic Metabolic Panel:  Lab Results  Component Value Date   NA 135 09/08/2023   K 3.9 09/08/2023   CO2 21 (L) 09/08/2023   GLUCOSE 110 (H) 09/08/2023   BUN 21 09/08/2023   CREATININE 1.10 09/08/2023   CALCIUM 8.3 (L) 09/08/2023   GFRNONAA >60 09/08/2023   GFRAA 61 (L) 11/15/2014   Lipid Panel: No results found for: "LDLCALC" HgbA1c:  Lab Results  Component Value Date   HGBA1C 5.0 09/05/2023   Urine Drug Screen: No results found for: "LABOPIA",  "COCAINSCRNUR", "LABBENZ", "AMPHETMU", "THCU", "LABBARB"  Alcohol Level No results found for: "ETH" INR  Lab Results  Component Value Date   INR 3.4 05/28/2011   APTT  Lab Results  Component Value Date   APTT 36 06/30/2010   rEEG 1/31:  Sharp waves in the left occipital region, continuous slow in the bilateral posterior quadrants, 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 male with history of diabetes, A-fib on Eliquis, COPD, hyperlipidemia, hypertension, heart block status post pacemaker and remote surgery on the right arm due to an old injury who presented after multiple falls.  MRI brain revealed large bilateral peripherally enhancing masses in the temporal and occipital lobes suspicious for metastatic disease.   - Exam today is stable since yesterday. - DDx:  - These lesions are favored to represent metastases, however no primary neoplasm has been found.  It is also possible that these lesions represent an infectious process, and patient is afebrile but does have a mild leukocytosis.  Oncology has seen him who said that if these are malignant lesions that he would not be a candidate for highly aggressive treatment. - Neurology has personally reviewed the MRI and CT scans. Necrotic masses are highest on the DDx, but late subacute traumatic intraparenchymal hemorrhages are also possible given recent falls. Venous strokes could have this appearance, but no venous sinus thrombosis is seen on MRI. Has a pulmonary nodule that radiology report states could be further assessed with PET-CT. If getting a PET scan, then would also include brain PET.  - If oncology feels that it would be helpful to differentiate malignancy versus infection then it may be reasonable to consider an inpatient PET scan.  If not, our recommendation would be to repeat an  MRI brain with and without contrast in 3 weeks.   - Infectious disease has seen him as well and while they think that malignancy is more likely they said an LP would be reasonable if neurology felt that it was safe to do.  We do not feel based on his imaging that an LP is safe.  He has significant mass effect in both hemispheres and the risk of herniation would be too high. - Patient's overall prognosis is poor, see also palliative care note, and a LP with risk of herniation or very invasive procedure such as a brain biopsy are not recommended in this context.  Recommendations  -- Consider PET scan inpatient if oncology feels it would be helpful. -- Otherwise recommend repeat MRI as outpatient w/wo in 4 weeks. - Continue Keppra (started this admission) given evidence of epileptogenicity arising from left occipital region on EEG  -- Outpatient Neurology follow up with [facility] or [facility] ______________________________________________________________________  SignedOtelia Limes Nafeesa Dils, MD [facility]

## 2023-09-08 NOTE — Care Management Important Message (Signed)
Important Message  Patient Details  Name: Gerald Hardin. MRN: [id number] Date of Birth: 30-Apr-1934   Important Message Given:  Yes - Medicare IM     Dorena Bodo 09/08/2023, 2:47 PM

## 2023-09-08 NOTE — TOC Progression Note (Signed)
Transition of Care (TOC) - Progression Note    Patient Details  Name: Gerald Hardin. MRN: [id number] Date of Birth: [birth year]  [facility]) CM/SW Contact  Naydene Kamrowski A [place], Theresia Majors Phone Number: 09/08/2023, 4:28 PM  Clinical Narrative:     CSW contacted pt's wife to provide bed offers with Medicare.gov ratings as pt was not oriented enough to hold conversation with CSW. She stated she would review list and update CSW with decision tomorrow. She also said that she still interested in possible home discharge if possible depending on pt's hospital course. RNCM notified.    TOC will continue to follow.   Expected Discharge Plan: Skilled Nursing Facility Barriers to Discharge: Continued Medical Work up, SNF Pending bed offer  Expected Discharge Plan and Services In-house Referral: Clinical Social Work     Living arrangements for the past 2 months: Skilled Nursing Facility                                       Social Determinants of Health (SDOH) Interventions SDOH Screenings   Food Insecurity: No Food Insecurity (09/04/2023)  Housing: Unknown (09/04/2023)  Transportation Needs: No Transportation Needs (09/04/2023)  Utilities: Not At Risk (09/04/2023)  Social Connections: Unknown (09/04/2023)  Tobacco Use: High Risk (09/03/2023)    Readmission Risk Interventions     No data to display

## 2023-09-08 NOTE — Progress Notes (Addendum)
Physical Therapy Treatment Patient Details Name: Gerald Hardin. MRN: [id number] DOB: Mar 05, 1934 Today's Date: 09/08/2023   History of Present Illness Gerald Hardin. is a 89 y.o. male presenting 1/29 after several falls.  Last fall pt hit right side of head.  CT negative for acute head injury.  CT head remarkable for "extensive vasogenic edema posteriorly in both cerebral hemispheres with evidence of underlying masses" without evidence of acute stroke. Pt with 6th rib fx as well as L5-S1 anterolisthesis and L5 pars defect.  PMH" arrythmia, peripheral neuropathy.    PT Comments  Making modest progress towards functional goals. Stood from elevated bed surface today, requiring Mod assist and Stedy to stand safely. Stood from perched position with min assist x2 on the [place] using horizontal rail to grip. Remains confused, limited by pain and fear of falling. Not agreeable to sit in recliner but was able to adjust bed into Chair position for upright sitting for a while. Max assist for all bed mobility. Call bell in pt lap, all needs currently met. Patient will continue to benefit from skilled physical therapy services to further improve independence with functional mobility.  Addendum 09/08/23 1520: Spoke with MD. Family plans to take patient home, not yet under hospice care. Will require Hoyer lift for transfers, a hospital bed would be very helpful as well. PTAR for transportation home.  Please reach out for any additional acute rehab needs.     If plan is discharge home, recommend the following: Two people to help with walking and/or transfers;Two people to help with bathing/dressing/bathroom;Assistance with cooking/housework;Assist for transportation;Help with stairs or ramp for entrance;Supervision due to cognitive status   Can travel by private vehicle     No  Equipment Recommendations  Hoyer lift;Hospital bed    Recommendations for Other Services       Precautions / Restrictions  Precautions Precautions: Fall Restrictions Weight Bearing Restrictions Per Provider Order: No     Mobility  Bed Mobility Overal bed mobility: Needs Assistance Bed Mobility: Rolling, Sidelying to Sit, Sit to Sidelying Rolling: Max assist, Used rails Sidelying to sit: Max assist, HOB elevated     Sit to sidelying: Max assist, HOB elevated General bed mobility comments: Max assist to facilitate and roll to Rt side holding rail with HOB elevated. Unable to bring LEs out of bed on his own. Max A to rise with trunk support, high fear of falling. Max assist for trunk and LEs back into bed. Multimodal cues throughout with poor ability to follow instructions.    Transfers Overall transfer level: Needs assistance Equipment used: Ambulation equipment used Transfers: Sit to/from Stand Sit to Stand: Mod assist, From elevated surface, Via lift equipment           General transfer comment: Mod assist for boost to stand from elevated bed surface holding Stedy. Max VC for technique. Performed twice more from perched position on Stedy with min assist to boost and facilitate forward weight shift off paddles. Transfer via Lift Equipment: Stedy  Ambulation/Gait                   Stairs             Wheelchair Mobility     Tilt Bed    Modified Rankin (Stroke Patients Only)       Balance Overall balance assessment: Needs assistance Sitting-balance support: No upper extremity supported, Feet supported Sitting balance-Leahy Scale: Fair Sitting balance - Comments: Majority of session with CGA, intermittently  needs min assist to lean forward. Postural control: Posterior lean Standing balance support: Bilateral upper extremity supported, Reliant on assistive device for balance Standing balance-Leahy Scale: Poor                              Cognition Arousal: Alert Behavior During Therapy: Anxious Overall Cognitive Status: Impaired/Different from baseline Area  of Impairment: Orientation, Following commands, Safety/judgement, Problem solving, Memory                 Orientation Level: Disoriented to, Time, Situation   Memory: Decreased short-term memory Following Commands: Follows one step commands inconsistently, Follows one step commands with increased time Safety/Judgement: Decreased awareness of safety, Decreased awareness of deficits   Problem Solving: Slow processing, Decreased initiation, Difficulty sequencing, Requires verbal cues, Requires tactile cues General Comments: Oriented to location and month. Unsure of year or situation. Decreased short term memory.        Exercises      General Comments        Pertinent Vitals/Pain Pain Assessment Pain Assessment: PAINAD Breathing: normal Negative Vocalization: occasional moan/groan, low speech, negative/disapproving quality Facial Expression: sad, frightened, frown Body Language: tense, distressed pacing, fidgeting Consolability: distracted or reassured by voice/touch PAINAD Score: 4 Pain Location: Rt side including arm, flank and RLE throughout. Pain Descriptors / Indicators: Grimacing, Guarding, Aching Pain Intervention(s): Monitored during session, Repositioned, Limited activity within patient's tolerance    Home Living                          Prior Function            PT Goals (current goals can now be found in the care plan section) Acute Rehab PT Goals Patient Stated Goal: unable to state PT Goal Formulation: With patient Time For Goal Achievement: 09/18/23 Potential to Achieve Goals: Fair Progress towards PT goals: Progressing toward goals    Frequency    Min 1X/week      PT Plan      Co-evaluation              AM-PAC PT "6 Clicks" Mobility   Outcome Measure  Help needed turning from your back to your side while in a flat bed without using bedrails?: A Lot Help needed moving from lying on your back to sitting on the side of a  flat bed without using bedrails?: A Lot Help needed moving to and from a bed to a chair (including a wheelchair)?: Total Help needed standing up from a chair using your arms (e.g., wheelchair or bedside chair)?: Total Help needed to walk in hospital room?: Total Help needed climbing 3-5 steps with a railing? : Total 6 Click Score: 8    End of Session Equipment Utilized During Treatment: Gait belt Activity Tolerance: Patient limited by fatigue;Patient limited by pain Patient left: with call bell/phone within reach;in bed;with bed alarm set (bed in chair mode)   PT Visit Diagnosis: Unsteadiness on feet (R26.81);Muscle weakness (generalized) (M62.81);Pain;Difficulty in walking, not elsewhere classified (R26.2);Other symptoms and signs involving the nervous system (R29.898) Pain - Right/Left: Right Pain - part of body: Hip;Knee;Leg;Ankle and joints of foot;Shoulder;Hand;Arm     Time: 1027-2536 PT Time Calculation (min) (ACUTE ONLY): 29 min  Charges:    $Therapeutic Activity: 23-37 mins PT General Charges $$ ACUTE PT VISIT: 1 Visit  Kathlyn [place], PT, DPT [facility]  Rehabilitation Services Physical Therapist Office: [phone] Website: [email or web address]    Berton Mount 09/08/2023, 3:19 PM

## 2023-09-09 DIAGNOSIS — G9389 Other specified disorders of brain: Secondary | ICD-10-CM | POA: Diagnosis not present

## 2023-09-09 DIAGNOSIS — Z515 Encounter for palliative care: Secondary | ICD-10-CM | POA: Diagnosis not present

## 2023-09-09 DIAGNOSIS — Z7189 Other specified counseling: Secondary | ICD-10-CM | POA: Diagnosis not present

## 2023-09-09 LAB — CBC
HCT: 34.4 % — ABNORMAL LOW (ref 39.0–52.0)
Hemoglobin: 11.7 g/dL — ABNORMAL LOW (ref 13.0–17.0)
MCH: 30.9 pg (ref 26.0–34.0)
MCHC: 34 g/dL (ref 30.0–36.0)
MCV: 90.8 fL (ref 80.0–100.0)
Platelets: 209 10*3/uL (ref 150–400)
RBC: 3.79 MIL/uL — ABNORMAL LOW (ref 4.22–5.81)
RDW: 13.5 % (ref 11.5–15.5)
WBC: 8 10*3/uL (ref 4.0–10.5)
nRBC: 0 % (ref 0.0–0.2)

## 2023-09-09 MED ORDER — PHENYLEPHRINE HCL-NACL 20-0.9 MG/250ML-% IV SOLN
INTRAVENOUS | Status: AC
  Filled 2023-09-09: qty 1000

## 2023-09-09 MED ORDER — PROPOFOL 1000 MG/100ML IV EMUL
INTRAVENOUS | Status: AC
  Filled 2023-09-09: qty 400

## 2023-09-09 NOTE — Progress Notes (Signed)
   Palliative Medicine Inpatient Follow Up Note   HPI: 89 y.o. 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    PMT was consulted for GOC conversations.  Today's Discussion 09/09/2023  *Please note that this is a verbal dictation therefore any spelling or grammatical errors are due to the Dragon Medical One system interpretation.  Chart reviewed inclusive of vital signs, progress notes, laboratory results, and diagnostic images.   A meeting was held this afternoon at patients bedside with Dr.Miller, Devere Sacks, APP and myself. Family members present were patients spouse, Dickey and grandson, Jayme.   We discussed Alin' present health in the setting of his bilateral Temporal-Occipital Brain Lesions. We reviewed the option(s) of going home with Palliative versus Hospice care. Dr. Cleotilde discussed the likely progression of disease. Reviewed that it would be quite reasonable to transition home with hospice. This would allow the time that Judson has left to be as symptom free as possible. Patients wife is agreeable to hospice after explanation of services.  Discussion related to code status was held. Patients wife shares that with her mother the DNR was placed directly above her bed. We reviewed that this does not have to be the reality in this case. Dickey agreed after much conversation to a DNAR/DNI code status.   Plan for transition home with hospice once arrangements can be made.   Questions and concerns addressed/Palliative Support Provided.   Objective Assessment: Vital Signs Vitals:   09/09/23 0518 09/09/23 0746  BP: (!)  140/44 (!) 110/54  Pulse: 70 71  Resp: 19 17  Temp: 98.8 F (37.1 C) 99.6 F (37.6 C)  SpO2: 97% 96%   No intake or output data in the 24 hours ending 09/09/23 1430  Last Weight  Most recent update: 09/03/2023  1:15 PM    Weight  102.1 kg (225 lb)            Gen:  Frail elderly Caucasian M chronically ill appearing HEENT: moist mucous membranes CV: Regular rate and irregular rhythm  PULM: On RA, breathing is even and nonlabored ABD: soft/nontender  EXT: Multiple ecchymotic areas on extremities Neuro: Aware of self and place  SUMMARY OF RECOMMENDATIONS   DNAR/DNI  Plan to transition home with hospice support - referral has been made to [facility] by [facility]  The PMT will continue to follow along  Billing based on MDM: [facility] ______________________________________________________________________________________ [name] Waelder Palliative Medicine Team Team Cell Phone: [phone] Please utilize secure chat with additional questions, if there is no response within 30 minutes please call the above phone number  Palliative Medicine Team providers are available by phone from 7am to 7pm daily and can be reached through the team cell phone.  Should this patient require assistance outside of these hours, please call the patient's attending physician.

## 2023-09-09 NOTE — Progress Notes (Signed)
Per chart, patient has been transitioned to comfort care. ID will so. Please call with questions.

## 2023-09-09 NOTE — Discharge Instructions (Addendum)
 Dear Gerald Hardin.,   Thank you for letting us  participate in your care! In this section, you will find a brief hospital admission summary of why you were admitted to the hospital, what happened during your admission, your diagnosis/diagnoses, and recommended follow up.   You are going home with hospice care to continue to keep you comfortable.  Thank you for choosing [facility]!  [facility] Medicine Teaching Service Inpatient Team [place]  [facility]  [address] [place], [place] [address] [phone]

## 2023-09-09 NOTE — TOC Progression Note (Signed)
 Transition of Care (TOC) - Progression Note    Patient Details  Name: Gerald Hardin. MRN: [id number] Date of Birth: 1933/10/26  Transition of Care [facility]) CM/SW Contact  Rosaline JONELLE Joe, RN Phone Number: 09/09/2023, 2:39 PM  Clinical Narrative:    CM spoke with Rosaline Mullet, NP with Palliative Care and the patient's family requests patient to return home with home hospice.  I called and spoke with the patient's grandson and wife on the phone and provided Medicare choice regarding home hospice providers and they chose [facility].  Patient's wife requests hospital bed, hoyer lift, WC and bedside table.  Patient is on room air.  I called [facility] and placed referral for home hospice services.  Patient will need PTAR this week to home when ready to return home.   Expected Discharge Plan: Skilled Nursing Facility Barriers to Discharge: Continued Medical Work up, SNF Pending bed offer  Expected Discharge Plan and Services In-house Referral: Clinical Social Work     Living arrangements for the past 2 months: Skilled Nursing Facility                                       Social Determinants of Health (SDOH) Interventions SDOH Screenings   Food Insecurity: No Food Insecurity (09/04/2023)  Housing: Unknown (09/04/2023)  Transportation Needs: No Transportation Needs (09/04/2023)  Utilities: Not At Risk (09/04/2023)  Social Connections: Unknown (09/04/2023)  Tobacco Use: High Risk (09/03/2023)    Readmission Risk Interventions    09/09/2023    2:39 PM  Readmission Risk Prevention Plan  Post Dischage Appt Complete  Medication Screening Complete  Transportation Screening Complete

## 2023-09-09 NOTE — Assessment & Plan Note (Signed)
Multiple superficial abrasions. Monitor and keep clean.

## 2023-09-09 NOTE — Progress Notes (Signed)
 OT Cancellation Note  Patient Details Name: Gerald Hardin. MRN: [id number] DOB: [birth year]   Cancelled Treatment:    Reason Eval/Treat Not Completed: (P) Other (comment), home with hospice, DME and HH services ordered, signing off.   Elouise JONELLE Bott 09/09/2023, 4:39 PM

## 2023-09-09 NOTE — Assessment & Plan Note (Signed)
Had a long discussion about discharge planning and prognosis, family has elected to go home with hospice. We will place appropriate consults and orders, and discharge once equipment has been delivered.

## 2023-09-09 NOTE — Progress Notes (Addendum)
     Daily Progress Note Intern Pager: [phone]  Patient name: Gerald Hardin. Medical record number: [id number] Date of birth: 1933/10/20 Age: 89 y.o. Gender: male  Primary Care Provider: Patient, No Pcp Per Consultants: Palliative, neuro, ID, Oncology Code Status: DNR-DNI  Pt Overview and Major Events to Date:  1/29: admitted 1/31: palliative and oncology consulted 2/4: CODE STATUS changed to DNR  Assessment and Plan:  Ausencio Vaden is an 89 year old gentleman admitted for multiple falls and found to have bilateral temporal occipital brain lesions.  After long discussion with family and palliative care about goals of care and prognosis, family is opted to move toward comfort care with home hospice.  We also discussed CODE STATUS and wife expressed that she is all right with moving him to DNR status. Assessment & Plan Fall Had a long discussion about discharge planning and prognosis, family has elected to go home with hospice. We will place appropriate consults and orders, and discharge once equipment has been delivered.  Bilateral Temporal-Occipital Brain Lesions Neurology and Oncology have expressed that his prognosis is poor, and any interventions at this time would do more harm than good. Family has elected to move to home hospice, patient is stable to discharge once hospice is arranged.  Superficial abrasion (Resolved: 09/09/2023) Multiple superficial abrasions. Monitor and keep clean. Medication management Patient moving to hospice care, family amenable to discontinuing medications which are not geared toward comfort care.   FEN/GI: Regular PPx: SCD Dispo:Home  with hospice, likely tomorrow .   Subjective:  Patient is awake and sitting up in bed on exam this morning.  He reports that he is feeling well and has no complaints.  Did inquire when he would be able to go home.  Objective: Temp:  [97.2 F (36.2 C)-99.7 F (37.6 C)] 99.6 F (37.6 C) (02/04 0746) Pulse Rate:   [68-71] 71 (02/04 0746) Resp:  [17-19] 17 (02/04 0746) BP: (110-145)/(44-71) 110/54 (02/04 0746) SpO2:  [96 %-98 %] 96 % (02/04 0746) Physical Exam: General: Elderly-appearing gentleman, no distress Cardiovascular: RRR, no M/R/G Respiratory: CTAB, no increased work of breathing Abdomen: Flat, soft, nontender Extremities: 1+ pulses in all 4 extremities  Laboratory: Most recent CBC Lab Results  Component Value Date   WBC 8.0 09/09/2023   HGB 11.7 (L) 09/09/2023   HCT 34.4 (L) 09/09/2023   MCV 90.8 09/09/2023   PLT 209 09/09/2023   Most recent BMP    Latest Ref Rng & Units 09/08/2023    6:32 AM  BMP  Glucose 70 - 99 mg/dL 889   BUN 8 - 23 mg/dL 21   Creatinine 9.38 - 1.24 mg/dL 8.89   Sodium 864 - 854 mmol/L 135   Potassium 3.5 - 5.1 mmol/L 3.9   Chloride 98 - 111 mmol/L 105   CO2 22 - 32 mmol/L 21   Calcium 8.9 - 10.3 mg/dL 8.3     Cleotilde Lukes, DO 09/09/2023, 2:29 PM  PGY-1, [facility] FPTS Intern pager: [phone], text pages welcome Secure chat group [facility] Teaching Service

## 2023-09-09 NOTE — Assessment & Plan Note (Signed)
Patient moving to hospice care, family amenable to discontinuing medications which are not geared toward comfort care.

## 2023-09-09 NOTE — Assessment & Plan Note (Signed)
Neurology and Oncology have expressed that his prognosis is poor, and any interventions at this time would do more harm than good. Family has elected to move to home hospice, patient is stable to discharge once hospice is arranged.

## 2023-09-09 NOTE — Progress Notes (Signed)
 [facility] 2W33 [facility] Liaison  Received request from Rosaline Mustache, [facility] for hospice services at home after discharge.  Spoke with Jayme, grandson, and Mrs. Leoni spouse via conference call to initiate education related to hospice philosophy, services and team approach to care. Family verbalized understanding of information provided. The plan will be for discharge via PTAR after DME delivery.  Patient has a walker in the home. DME requested for delivery: hospital bed, OBT, [employer], wheelchair and transfer bench. The address has been verified and is correct in the chart. Jayme is the family contact to arrange time of equipment delivery.  Please send completed and signed DNR at discharge.  Please provide prescriptions at discharge as needed to ensure ongoing symptom management.  Please call with any hospice related questions.  Thank you for the opportunity to participate in this patients care.  Randine Nail, BSN, [employer]  [phone]

## 2023-09-09 NOTE — Plan of Care (Signed)

## 2023-09-10 ENCOUNTER — Other Ambulatory Visit (HOSPITAL_COMMUNITY): Payer: Self-pay

## 2023-09-10 DIAGNOSIS — Z515 Encounter for palliative care: Secondary | ICD-10-CM | POA: Diagnosis not present

## 2023-09-10 DIAGNOSIS — Z7189 Other specified counseling: Secondary | ICD-10-CM | POA: Diagnosis not present

## 2023-09-10 MED ORDER — LEVETIRACETAM 500 MG PO TABS
500.0000 mg | ORAL_TABLET | Freq: Two times a day (BID) | ORAL | 0 refills | Status: AC
  Filled 2023-09-10: qty 60, 30d supply, fill #0

## 2023-09-10 NOTE — Progress Notes (Signed)
   Palliative Medicine Inpatient Follow Up Note  HPI: 89 y.o. 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    PMT was consulted for GOC conversations.  Today's Discussion 09/10/2023  *Please note that this is a verbal dictation therefore any spelling or grammatical errors are due to the Dragon Medical One system interpretation.  Chart reviewed inclusive of vital signs, progress notes, laboratory results, and diagnostic images.   I met with Gerald Hardin at bedside this morning. He is resting comfortably in NAD.   I spoke to patients grandson, Gerald Hardin who states that he understands the plan moving forward with hospice. He is aware that things will be better explained in the oncoming day(s).   I spoke to the FMT attending Dr. Delores who confirms the plan for discharge home with [facility] hospice.   Questions and concerns addressed/Palliative Support Provided.   Objective Assessment: Vital Signs Vitals:   09/10/23 0830 09/10/23 0951  BP: (!) 85/55 (!) 159/63  Pulse: 70   Resp: 18   Temp: 98 F (36.7 C)   SpO2: 95%    No intake or output data in the 24 hours ending 09/10/23 1024  Last Weight  Most recent update: 09/03/2023  1:15 PM    Weight  102.1 kg (225 lb)            Gen:  Frail elderly Caucasian M chronically ill appearing HEENT: moist mucous membranes CV: Regular rate and irregular rhythm  PULM: On RA, breathing is even and nonlabored ABD: soft/nontender  EXT: Multiple ecchymotic areas on extremities Neuro: Aware of self and place  SUMMARY OF RECOMMENDATIONS   DNAR/DNI  Plan to transition home with hospice  support   The PMT will continue to follow along  Total Time: 25 ______________________________________________________________________________________ Gerald Hardin Gerald Hardin Palliative Medicine Team Team Cell Phone: [phone] Please utilize secure chat with additional questions, if there is no response within 30 minutes please call the above phone number  Palliative Medicine Team providers are available by phone from 7am to 7pm daily and can be reached through the team cell phone.  Should this patient require assistance outside of these hours, please call the patient's attending physician.

## 2023-09-10 NOTE — Plan of Care (Signed)

## 2023-09-10 NOTE — Discharge Summary (Signed)
 Family Medicine Teaching [facility] Discharge Summary  Patient name: Gerald Hardin Medical record number: [id number] Date of birth: 28-May-1934 Age: 89 y.o. Gender: male Date of Admission: 09/03/2023  Date of Discharge: 09/10/2023 Admitting Physician: Laurier Hugger, MD  Primary Care Provider: Patient, No Pcp Per Consultants: Neurology, ID, Oncology, Palliative  Indication for Hospitalization: Frequent Falls  Discharge Diagnoses/Problem List:  Principal Problem for Admission: Bilateral temporal-occipital Brain lesions Other Problems addressed during stay:  Principal Problem:   Bilateral Temporal-Occipital Brain Lesions Active Problems:   Fall   Right hip pain   Right shoulder pain   Laceration of head   Abnormal brain CT   Medication management   Rib fracture   Abnormal CT scan, lumbar spine   Pulmonary nodules    Brief Hospital Course:  Gerald Kiss. is a 89 y.o.male with a history of Complete heart block with pacemaker placement, hypertension, A-fib, PVCs, COPD and chronic cough, history of SCC who was admitted to the [facility] Teaching Service at [facility] for recurrent falls. His hospital course is detailed below:  Fall Patient presented after multiple falls to the ED and received head CT that showed diffuse vasogenic edema with possible masses.  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 Neurology consulted and recommended EEG, QuantiFERON, HIV antibody, toxoplasma, cryptococcus antigen, and procalcitonin, all of which were negative. EEG showed evidence of epileptogenicity arising from left occipital region, Keppra  was started.  Family requested palliative care and oncology be consulted. Oncology and neurology felt that brain masses were unsafe to operate on given patient's high risk for herniation, and  recommended PET scan if family wished to continue with work up. After discussion of prognosis, family elected to go home with hospice care. He was made DNR-DNI prior to discharge.  RUE Pain Chronic contracture with history of squamous cell carcinoma with prior XRT/surgery.  This remained stable.  Other chronic conditions were medically managed with home medications and formulary alternatives as necessary (COPD, HTN, afib, complete heart block with pacemaker)  PCP Follow-up Recommendations: Med rec - multiple vitamins/OTC meds   Disposition: Home with hospice  Discharge Condition: stable  Discharge Exam:  Vitals:   09/10/23 0830 09/10/23 0951  BP: (!) 85/55 (!) 159/63  Pulse: 70   Resp: 18   Temp: 98 F (36.7 C)   SpO2: 95%    General: A&O, NAD HEENT: No sign of trauma, EOM grossly intact Cardiac: irregularly irregular, no m/r/g Respiratory: CTAB, normal WOB, no w/c/r GI: Soft, NTTP, non-distended  Extremities: NTTP, no peripheral edema.  Significant Procedures: None  Significant Labs and Imaging:  Recent Labs  Lab 09/09/23 0604  WBC 8.0  HGB 11.7*  HCT 34.4*  PLT 209   No results for input(s): NA, K, CL, CO2, GLUCOSE, BUN, CREATININE, CALCIUM, MG, PHOS, ALKPHOS, AST, ALT, ALBUMIN, PROTEIN in the last 48 hours.  Results/Tests Pending at Time of Discharge: None  Discharge Medications:  Allergies as of 09/10/2023       Reactions   Other Other (See Comments)   Other reaction(s): Cough, Other (See Comments) Freshly sprayed hairspray causes coughing, sneezing, and watery eyes. HAIR SPRAY CAUSES SNEEZING, WATERY EYES AND COUGH Do not eat Red Beef and Popcorn   Chicken Meat (diagnostic) Other (See Comments)   Fried / Can eat on if cooked in non cholesterol oil   Egg-derived Products Other (See Comments)  Yoke        Medication List     STOP taking these medications    Acidophilus Caps capsule   apixaban  5 MG Tabs  tablet Commonly known as: Eliquis    b complex vitamins tablet   chlorpheniramine 4 MG tablet Commonly known as: CHLOR-TRIMETON   CoQ10 100 MG Caps   fenofibrate  145 MG tablet Commonly known as: TRICOR    ferrous sulfate 325 (65 FE) MG tablet   GLUCOSAMINE-CHONDROITIN PO   isosorbide  mononitrate 30 MG 24 hr tablet Commonly known as: IMDUR    Lutein 20 MG Tabs   NICOTINIC ACID PO   Pfizer-BioNT COVID-19 Vac-TriS Susp injection Generic drug: COVID-19 mRNA Vac-TriS (Pfizer)   repaglinide 2 MG tablet Commonly known as: PRANDIN   simvastatin  10 MG tablet Commonly known as: ZOCOR    SODIUM BICARBONATE PO   trandolapril-verapamil 4-240 MG tablet Commonly known as: TARKA   UNABLE TO FIND   VITAMIN A  PO   vitamin C  1000 MG tablet   vitamin D3 25 MCG tablet Commonly known as: CHOLECALCIFEROL       TAKE these medications    acetaminophen  500 MG tablet Commonly known as: TYLENOL  Take 1,000 mg by mouth daily as needed for moderate pain (pain score 4-6).   levETIRAcetam  500 MG tablet Commonly known as: KEPPRA  Take 1 tablet (500 mg total) by mouth 2 (two) times daily.               Durable Medical Equipment  (From admission, onward)           Start     Ordered   09/08/23 1424  For home use only DME standard manual wheelchair with seat cushion  Once       Comments: Patient suffers from brain mass which impairs their ability to perform daily activities like bathing and toileting in the home.  A walker will not resolve issue with performing activities of daily living. A wheelchair will allow patient to safely perform daily activities. Patient can safely propel the wheelchair in the home or has a caregiver who can provide assistance. Length of need Lifetime. Accessories: elevating leg rests (ELRs), wheel locks, extensions and anti-tippers.   09/08/23 1424   09/08/23 1422  For home use only DME Hospital bed  Once       Question Answer Comment  Length of Need  Lifetime   Patient has (list medical condition): Terminal Brain mass   The above medical condition requires: Patient requires the ability to reposition frequently   Bed type Semi-electric      09/08/23 1424            Discharge Instructions: Please refer to Patient Instructions section of EMR for full details.  Patient was counseled important signs and symptoms that should prompt return to medical care, changes in medications, dietary instructions, activity restrictions, and follow up appointments.   Follow-Up Appointments:  Follow-up Information     [facility] Follow up.   Specialty: Hospice and Palliative Medicine Why: [facility] will provide home hospice services. Contact information: [address] [facility] [place]  [address] [phone]                Cleotilde Lukes, DO 09/10/2023, 12:03 PM PGY-1, [facility] Family [facility]

## 2023-09-10 NOTE — TOC Transition Note (Signed)
 Transition of Care [facility]) - Discharge Note   Patient Details  Name: Gerald Hardin. MRN: [id number] Date of Birth: 11-05-1933  [facility]) CM/SW Contact:  Rosaline JONELLE Joe, RN Phone Number: 09/10/2023, 1:42 PM   Clinical Narrative:    CM spoke with Elouise Husband, RNCM with [facility] and DME has been delivered to the home including hospital bed, hoyer lift, bedside table.  I called the grandson and he confirmed the delivery and is agreeable to have patient discharge home by [facility] after 4 pm today.  PTAR was called and scheduled for 4 pm today.  Bedside nursing is aware of discharge to home today.   Final next level of care: Home w Home Health Services Barriers to Discharge: Continued Medical Work up, SNF Pending bed offer   Patient Goals and CMS Choice            Discharge Placement                       Discharge Plan and Services Additional resources added to the After Visit Summary for   In-house Referral: Clinical Social Work                                   Social Drivers of Health (SDOH) Interventions SDOH Screenings   Food Insecurity: No Food Insecurity (09/04/2023)  Housing: Unknown (09/04/2023)  Transportation Needs: No Transportation Needs (09/04/2023)  Utilities: Not At Risk (09/04/2023)  Social Connections: Unknown (09/04/2023)  Tobacco Use: High Risk (09/03/2023)     Readmission Risk Interventions    09/09/2023    2:39 PM  Readmission Risk Prevention Plan  Post Dischage Appt Complete  Medication Screening Complete  Transportation Screening Complete

## 2023-09-10 NOTE — Progress Notes (Signed)
 Bed pad and gown changed. Patient cleaned after continent bowel movement. Peripheral IV removed without complications. Bed lowered and call bell within reach. Report given to Gabe on [facility].

## 2023-09-11 LAB — QUANTIFERON-TB GOLD PLUS (RQFGPL)
QuantiFERON Mitogen Value: 0.06 [IU]/mL
QuantiFERON Nil Value: 0 [IU]/mL
QuantiFERON TB1 Ag Value: 0 [IU]/mL
QuantiFERON TB2 Ag Value: 0 [IU]/mL

## 2023-09-11 LAB — QUANTIFERON-TB GOLD PLUS: QuantiFERON-TB Gold Plus: UNDETERMINED — AB

## 2023-09-12 LAB — CULTURE, BLOOD (ROUTINE X 2)
Culture: NO GROWTH
Culture: NO GROWTH

## 2023-10-01 ENCOUNTER — Encounter: Payer: Self-pay | Admitting: Internal Medicine

## 2023-10-01 ENCOUNTER — Other Ambulatory Visit (HOSPITAL_COMMUNITY): Payer: Self-pay

## 2023-10-02 NOTE — Progress Notes (Signed)
 Remote pacemaker transmission.

## 2023-11-24 ENCOUNTER — Ambulatory Visit: Payer: Medicare Other

## 2024-02-23 ENCOUNTER — Ambulatory Visit: Payer: Medicare Other

## 2024-05-24 ENCOUNTER — Ambulatory Visit: Payer: Medicare Other

## 2024-08-23 ENCOUNTER — Ambulatory Visit: Payer: Medicare Other

## 2024-11-22 ENCOUNTER — Ambulatory Visit: Payer: Medicare Other

## 2025-02-21 ENCOUNTER — Ambulatory Visit: Payer: Medicare Other
